# Patient Record
Sex: Female | Born: 1950 | Race: White | Hispanic: No | Smoking: Never smoker
Health system: Southern US, Community
[De-identification: ages and names within clinical notes are randomized; demographics above are authoritative.]

## PROBLEM LIST (undated history)

## (undated) DIAGNOSIS — A048 Other specified bacterial intestinal infections: Secondary | ICD-10-CM

## (undated) DIAGNOSIS — Z9289 Personal history of other medical treatment: Secondary | ICD-10-CM

## (undated) DIAGNOSIS — E871 Hypo-osmolality and hyponatremia: Secondary | ICD-10-CM

## (undated) DIAGNOSIS — K5900 Constipation, unspecified: Secondary | ICD-10-CM

## (undated) DIAGNOSIS — E7800 Pure hypercholesterolemia, unspecified: Secondary | ICD-10-CM

## (undated) DIAGNOSIS — E559 Vitamin D deficiency, unspecified: Secondary | ICD-10-CM

## (undated) DIAGNOSIS — F32A Depression, unspecified: Secondary | ICD-10-CM

## (undated) DIAGNOSIS — F329 Major depressive disorder, single episode, unspecified: Secondary | ICD-10-CM

## (undated) DIAGNOSIS — Z860100 Personal history of colon polyps, unspecified: Secondary | ICD-10-CM

## (undated) DIAGNOSIS — K5790 Diverticulosis of intestine, part unspecified, without perforation or abscess without bleeding: Secondary | ICD-10-CM

## (undated) DIAGNOSIS — G43909 Migraine, unspecified, not intractable, without status migrainosus: Secondary | ICD-10-CM

## (undated) DIAGNOSIS — R87619 Unspecified abnormal cytological findings in specimens from cervix uteri: Secondary | ICD-10-CM

## (undated) DIAGNOSIS — M1990 Unspecified osteoarthritis, unspecified site: Secondary | ICD-10-CM

## (undated) HISTORY — DX: Personal history of other medical treatment: Z92.89

## (undated) HISTORY — PX: RECTAL PROLAPSE REPAIR: SHX759

## (undated) HISTORY — DX: Unspecified abnormal cytological findings in specimens from cervix uteri: R87.619

## (undated) HISTORY — PX: UMBILICAL HERNIA REPAIR: SHX196

## (undated) HISTORY — PX: COSMETIC SURGERY: SHX468

## (undated) HISTORY — PX: ABDOMINAL HYSTERECTOMY: SHX81

---
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
## 1982-08-07 DIAGNOSIS — Z9289 Personal history of other medical treatment: Secondary | ICD-10-CM

## 1982-08-07 HISTORY — DX: Personal history of other medical treatment: Z92.89

## 1982-08-07 HISTORY — PX: HEMORRHOID SURGERY: SHX153

## 1998-03-11 ENCOUNTER — Other Ambulatory Visit: Admission: RE | Admit: 1998-03-11 | Discharge: 1998-03-11 | Payer: Self-pay | Admitting: Obstetrics & Gynecology

## 2000-01-23 ENCOUNTER — Encounter: Payer: Self-pay | Admitting: Emergency Medicine

## 2000-01-23 ENCOUNTER — Emergency Department (HOSPITAL_COMMUNITY): Admission: EM | Admit: 2000-01-23 | Discharge: 2000-01-23 | Payer: Self-pay | Admitting: Emergency Medicine

## 2000-02-24 ENCOUNTER — Encounter: Payer: Self-pay | Admitting: Obstetrics & Gynecology

## 2000-02-24 ENCOUNTER — Encounter: Admission: RE | Admit: 2000-02-24 | Discharge: 2000-02-24 | Payer: Self-pay | Admitting: Obstetrics & Gynecology

## 2000-10-02 ENCOUNTER — Other Ambulatory Visit: Admission: RE | Admit: 2000-10-02 | Discharge: 2000-10-02 | Payer: Self-pay | Admitting: Obstetrics & Gynecology

## 2001-06-20 ENCOUNTER — Encounter: Admission: RE | Admit: 2001-06-20 | Discharge: 2001-06-20 | Payer: Self-pay | Admitting: Obstetrics and Gynecology

## 2001-06-20 ENCOUNTER — Encounter: Payer: Self-pay | Admitting: Obstetrics and Gynecology

## 2002-05-19 ENCOUNTER — Encounter: Admission: RE | Admit: 2002-05-19 | Discharge: 2002-05-19 | Payer: Self-pay | Admitting: Rheumatology

## 2002-05-19 ENCOUNTER — Encounter: Payer: Self-pay | Admitting: Rheumatology

## 2002-06-03 ENCOUNTER — Encounter: Payer: Self-pay | Admitting: Rheumatology

## 2002-06-03 ENCOUNTER — Encounter: Admission: RE | Admit: 2002-06-03 | Discharge: 2002-06-03 | Payer: Self-pay | Admitting: Rheumatology

## 2003-02-04 ENCOUNTER — Other Ambulatory Visit: Admission: RE | Admit: 2003-02-04 | Discharge: 2003-02-04 | Payer: Self-pay | Admitting: Family Medicine

## 2003-06-25 ENCOUNTER — Encounter: Admission: RE | Admit: 2003-06-25 | Discharge: 2003-06-25 | Payer: Self-pay | Admitting: Family Medicine

## 2004-02-12 ENCOUNTER — Other Ambulatory Visit: Admission: RE | Admit: 2004-02-12 | Discharge: 2004-02-12 | Payer: Self-pay | Admitting: Family Medicine

## 2004-11-04 ENCOUNTER — Encounter: Admission: RE | Admit: 2004-11-04 | Discharge: 2004-11-04 | Payer: Self-pay | Admitting: Obstetrics & Gynecology

## 2005-08-11 ENCOUNTER — Other Ambulatory Visit: Admission: RE | Admit: 2005-08-11 | Discharge: 2005-08-11 | Payer: Self-pay | Admitting: Family Medicine

## 2005-11-08 ENCOUNTER — Encounter: Admission: RE | Admit: 2005-11-08 | Discharge: 2005-11-08 | Payer: Self-pay | Admitting: Family Medicine

## 2005-11-23 ENCOUNTER — Encounter: Admission: RE | Admit: 2005-11-23 | Discharge: 2005-11-23 | Payer: Self-pay | Admitting: Family Medicine

## 2006-05-15 ENCOUNTER — Ambulatory Visit (HOSPITAL_COMMUNITY): Admission: RE | Admit: 2006-05-15 | Discharge: 2006-05-15 | Payer: Self-pay | Admitting: Family Medicine

## 2006-06-26 ENCOUNTER — Ambulatory Visit: Payer: Self-pay | Admitting: Cardiology

## 2006-08-27 ENCOUNTER — Ambulatory Visit (HOSPITAL_COMMUNITY): Admission: RE | Admit: 2006-08-27 | Discharge: 2006-08-27 | Payer: Self-pay | Admitting: Family Medicine

## 2006-09-25 ENCOUNTER — Ambulatory Visit: Payer: Self-pay | Admitting: Cardiology

## 2006-09-25 LAB — CONVERTED CEMR LAB
ALT: 16 units/L (ref 0–40)
AST: 21 units/L (ref 0–37)
Albumin: 3.9 g/dL (ref 3.5–5.2)
Alkaline Phosphatase: 49 units/L (ref 39–117)
LDL Cholesterol: 104 mg/dL — ABNORMAL HIGH (ref 0–99)
Total CHOL/HDL Ratio: 3.3
Triglycerides: 67 mg/dL (ref 0–149)
VLDL: 13 mg/dL (ref 0–40)

## 2006-09-27 ENCOUNTER — Ambulatory Visit: Payer: Self-pay | Admitting: Cardiology

## 2006-12-18 ENCOUNTER — Encounter: Admission: RE | Admit: 2006-12-18 | Discharge: 2006-12-18 | Payer: Self-pay | Admitting: Family Medicine

## 2006-12-24 ENCOUNTER — Encounter: Admission: RE | Admit: 2006-12-24 | Discharge: 2006-12-24 | Payer: Self-pay | Admitting: Family Medicine

## 2007-01-10 ENCOUNTER — Encounter: Admission: RE | Admit: 2007-01-10 | Discharge: 2007-01-10 | Payer: Self-pay | Admitting: Family Medicine

## 2007-02-15 ENCOUNTER — Ambulatory Visit (HOSPITAL_COMMUNITY): Admission: RE | Admit: 2007-02-15 | Discharge: 2007-02-15 | Payer: Self-pay | Admitting: Family Medicine

## 2007-06-13 ENCOUNTER — Encounter: Admission: RE | Admit: 2007-06-13 | Discharge: 2007-06-13 | Payer: Self-pay | Admitting: Obstetrics & Gynecology

## 2007-08-28 ENCOUNTER — Ambulatory Visit (HOSPITAL_COMMUNITY): Admission: RE | Admit: 2007-08-28 | Discharge: 2007-08-28 | Payer: Self-pay | Admitting: Obstetrics & Gynecology

## 2007-12-23 ENCOUNTER — Encounter: Admission: RE | Admit: 2007-12-23 | Discharge: 2007-12-23 | Payer: Self-pay | Admitting: Obstetrics & Gynecology

## 2008-04-01 ENCOUNTER — Ambulatory Visit (HOSPITAL_COMMUNITY): Admission: RE | Admit: 2008-04-01 | Discharge: 2008-04-01 | Payer: Self-pay | Admitting: Obstetrics & Gynecology

## 2009-01-29 ENCOUNTER — Encounter: Admission: RE | Admit: 2009-01-29 | Discharge: 2009-01-29 | Payer: Self-pay | Admitting: Family Medicine

## 2009-02-05 ENCOUNTER — Encounter: Admission: RE | Admit: 2009-02-05 | Discharge: 2009-02-05 | Payer: Self-pay | Admitting: Family Medicine

## 2010-08-07 HISTORY — PX: COLONOSCOPY: SHX174

## 2010-08-27 ENCOUNTER — Encounter: Payer: Self-pay | Admitting: Family Medicine

## 2010-08-28 ENCOUNTER — Encounter: Payer: Self-pay | Admitting: Family Medicine

## 2010-11-04 ENCOUNTER — Emergency Department (HOSPITAL_COMMUNITY): Payer: BC Managed Care – PPO

## 2010-11-04 ENCOUNTER — Inpatient Hospital Stay (HOSPITAL_COMMUNITY)
Admission: EM | Admit: 2010-11-04 | Discharge: 2010-11-06 | DRG: 025 | Disposition: A | Discharge status: Home / Self Care | Payer: BC Managed Care – PPO | Attending: Internal Medicine | Admitting: Internal Medicine

## 2010-11-04 DIAGNOSIS — E785 Hyperlipidemia, unspecified: Secondary | ICD-10-CM | POA: Diagnosis present

## 2010-11-04 DIAGNOSIS — F341 Dysthymic disorder: Secondary | ICD-10-CM | POA: Diagnosis present

## 2010-11-04 DIAGNOSIS — R55 Syncope and collapse: Secondary | ICD-10-CM | POA: Diagnosis present

## 2010-11-04 DIAGNOSIS — G4700 Insomnia, unspecified: Secondary | ICD-10-CM | POA: Diagnosis present

## 2010-11-04 DIAGNOSIS — G43109 Migraine with aura, not intractable, without status migrainosus: Principal | ICD-10-CM | POA: Diagnosis present

## 2010-11-04 LAB — URINE MICROSCOPIC-ADD ON

## 2010-11-04 LAB — URINALYSIS, ROUTINE W REFLEX MICROSCOPIC
Glucose, UA: NEGATIVE mg/dL
Protein, ur: NEGATIVE mg/dL
Urobilinogen, UA: 0.2 mg/dL (ref 0.0–1.0)

## 2010-11-04 LAB — BASIC METABOLIC PANEL
BUN: 19 mg/dL (ref 6–23)
Chloride: 103 mEq/L (ref 96–112)
GFR calc Af Amer: >60 mL/min (ref >60)
GFR calc non Af Amer: >60 mL/min (ref >60)
Potassium: 3.7 mEq/L (ref 3.5–5.1)
Sodium: 136 mEq/L (ref 135–145)

## 2010-11-04 LAB — CBC
Platelets: 190 10*3/uL (ref 150–400)
RBC: 4.07 MIL/uL (ref 3.87–5.11)

## 2010-11-04 LAB — DIFFERENTIAL
Basophils Absolute: 0.1 10*3/uL (ref 0.0–0.1)
Basophils Relative: 1 % (ref 0–1)
Eosinophils Relative: 4 % (ref 0–5)
Lymphs Abs: 2.2 10*3/uL (ref 0.7–4.0)
Monocytes Absolute: 0.6 10*3/uL (ref 0.1–1.0)
Monocytes Relative: 9 % (ref 3–12)

## 2010-11-04 LAB — CK TOTAL AND CKMB (NOT AT ARMC)
Relative Index: INVALID (ref 0.0–2.5)
Total CK: 61 U/L (ref 7–177)

## 2010-11-04 LAB — TROPONIN I: Troponin I: 0.02 ng/mL (ref 0.00–0.06)

## 2010-11-04 LAB — GLUCOSE, CAPILLARY: Glucose-Capillary: 109 mg/dL — ABNORMAL HIGH (ref 70–99)

## 2010-11-05 ENCOUNTER — Inpatient Hospital Stay (HOSPITAL_COMMUNITY): Payer: BC Managed Care – PPO

## 2010-11-05 DIAGNOSIS — I059 Rheumatic mitral valve disease, unspecified: Secondary | ICD-10-CM

## 2010-11-05 LAB — BASIC METABOLIC PANEL
BUN: 13 mg/dL (ref 6–23)
Creatinine, Ser: 0.67 mg/dL (ref 0.4–1.2)
GFR calc Af Amer: >60 mL/min (ref >60)
GFR calc non Af Amer: >60 mL/min (ref >60)

## 2010-11-05 LAB — CARDIAC PANEL(CRET KIN+CKTOT+MB+TROPI)
CK, MB: 0.9 ng/mL (ref 0.3–4.0)
Relative Index: INVALID (ref 0.0–2.5)
Total CK: 46 U/L (ref 7–177)
Troponin I: 0.01 ng/mL (ref 0.00–0.06)
Troponin I: 0.02 ng/mL (ref 0.00–0.06)

## 2010-11-05 LAB — CBC
Hemoglobin: 11 g/dL — ABNORMAL LOW (ref 12.0–15.0)
MCV: 92.9 fL (ref 78.0–100.0)
Platelets: 191 10*3/uL (ref 150–400)
RBC: 3.53 MIL/uL — ABNORMAL LOW (ref 3.87–5.11)
WBC: 5.9 10*3/uL (ref 4.0–10.5)

## 2010-11-05 LAB — LIPID PANEL
HDL: 62 mg/dL (ref >39)
Total CHOL/HDL Ratio: 3 RATIO
VLDL: 8 mg/dL (ref 0–40)

## 2010-11-07 NOTE — Discharge Summary (Signed)
[name], [name]              ACCOUNT NO.:  [id number]  MEDICAL RECORD NO.:  [id number]           PATIENT TYPE:  I  LOCATION:  1405                         FACILITY:  [facility]  PHYSICIAN:  Hollice Espy, M.D.DATE OF BIRTH:  01-24-1951  DATE OF ADMISSION:  11/04/2010 DATE OF DISCHARGE:  11/06/2010                              DISCHARGE SUMMARY   PRIMARY CARE PHYSICIAN:  Duncan Dull, MD  DISCHARGE DIAGNOSES: 1. Syncope with negative workup, felt to be secondary to aura from     complicated migraine. 2. Complicated migraine. 3. History of anxiety. 4. History of hyperlipidemia. 5. History of depression.  DISCHARGE MEDICATIONS: 1. Imitrex 25 mg 1 p.o. p.r.n. for onset of headache, repeat in 2     hours once only if headache persists. 2. OxyIR 5 mg p.o. q.4 h. p.r.n. 3. Phenergan 12.5 p.o. q.8 h. p.r.n. 4. Xanax 0.25 p.o. b.i.d. p.r.n. 5. She will continue on Klonopin 2 p.o. nightly. 6. Lotemax 1 drop both eyes daily. 7. Restasis 1 drop both eyes b.i.d. 8. Trazodone 50 p.o. nightly. 9. Viibryd 20 mg p.o. daily.  HOSPITAL COURSE:  The patient is a 60 year old white female with past medical history of anxiety, depression, and hyperlipidemia who presented after a reported syncopal event while waiting in a line her fiance at a concert.  She was taken inside and revived, but then had another episode again and was brought to the emergency room.  According to the patient, she has had a headache which notes from transport to the emergency room until now with associated photophobia, some mild nausea as well.  The patient underwent a full syncope workup.  She had no events by telemetry.  Her MRI, MRA showed no evidence of any acute CVA or artery stenosis.  Cardiac markers were cycled and always were negative. Electrolytes were normal, no signs of any infection.  She was not orthostatic and on discussion with the patient, she had a similar event about a month ago where she had  a passing out spell followed by a headache.  The patient has a history of migraines, but did not have migraines for over a year and then she says they have returned back in the past as of late significantly.  We had an extensive discussion, we felt that likely the cause of her syncope is a migraine related aura and in terms of underlying treatment, we recommend treatment with anti- migraine medication, but more importantly I discussed with the patient if she certainly has migraines suddenly after an extended period of time and then there may be an underlying factor such as some sort of underlying personal stressor, I advised her to take a very close look at any kind of recent events that have perhaps set off this.  I do not think this is related to any type of hormonal change from menopause given the fact that her last period was 9 years ago.  The family, the patient, and her fiance understood and agreed to this plan.  For a full thorough workup, the patient underwent a 2-D echocardiogram which was performed on March 31st, the results of  which are pending.  Plan will be for the patient to be discharged to home.  She is not cleared to return back to work or to drive until she follows up with Dr. Shaune Pollack, her PCP, but when she does follow up with Dr. Shaune Pollack this week, her echo maybe reviewed including the rest of her followup. Her discharge diet is a regular diet.  Activity is slow to increase, again not cleared to return back to work or to drive.  Her disposition is improved.  She is being discharged to home.     Hollice Espy, M.D.     SKK/MEDQ  D:  11/06/2010  T:  11/07/2010  Job:  [id number]  cc:   Duncan Dull, M.D. Fax: [phone]  Electronically Signed by Virginia Rochester M.D. on 11/07/2010 02:37:58 PM

## 2010-11-09 NOTE — H&P (Signed)
[name], [name]              ACCOUNT NO.:  [id number]  MEDICAL RECORD NO.:  [id number]           PATIENT TYPE:  I  LOCATION:  1405                         FACILITY:  [facility]  PHYSICIAN:  Della Goo, M.D. DATE OF BIRTH:  06/06/1951  DATE OF ADMISSION:  11/04/2010 DATE OF DISCHARGE:                             HISTORY & PHYSICAL   PRIMARY CARE PHYSICIAN:  Duncan Dull, MD  CHIEF COMPLAINT:  Nearly passed out.  HISTORY OF PRESENT ILLNESS:  This is a 60 year old female who was brought to the emergency department by her husband after nearly passing out while they were standing in line for a concert this evening.  This occurred at about 8 p.m.  The patient was caught before she fell to the ground.  They went inside and it happened again.  The patient does not recall what had happened, but she reportedly did not pass out.  The patient had no prodromal symptoms prior to passing out.  Her husband reports that they had not had dinner and her last meal was at lunch. The patient denied having any chest pain or headache prior to the episode.  Following the episode, she began to have a migraine headache. The patient reports as well not sleeping well over the past 24 hours despite taking her regular medications for sleeping.  The patient denies having any previous similar episodes of syncope.  PAST MEDICAL HISTORY: 1. Dyslipidemia. 2. Depression. 3. Anxiety. 4. Migraine headaches.  PAST SURGICAL HISTORY:  None.  MEDICATIONS:  At this time include Viibryd an SSRI, Klonopin, trazodone.  ALLERGIES:  No known drug allergies; however, the patient has intolerances to statin medications and Zetia, all causing myalgias.  SOCIAL HISTORY:  The patient is married.  She is a nonsmoker, nondrinker.  No history of illicit drug usage.  FAMILY HISTORY:  Positive for coronary artery disease in her mother who died at age 22 of an MI and her maternal grandfather who died at age 65 of an  MI.  Positive hypertension in her mother and positive cancer in her father who had eye cancer.  No diabetic disease in her family that she knows of.  REVIEW OF SYSTEMS:  Pertinents mentioned above.  The patient denies having any nausea, vomiting, diarrhea, fevers, chills.  PHYSICAL EXAMINATION:  GENERAL:  This is a 60 year old well-nourished, well-developed Caucasian female who is in no acute distress. VITAL SIGNS:  Temperature 97.6, blood pressure 119/86, heart rate 71, respirations 16, O2 sats 99%. HEENT:  Normocephalic, atraumatic.  Pupils equally round, reactive to light.  Extraocular movements are intact.  Funduscopic benign.  There is no scleral icterus.  Nares are patent bilaterally.  Oropharynx is clear. NECK:  Supple.  Full range of motion.  No thyromegaly, adenopathy, jugular venous distention. CARDIOVASCULAR:  Regular rate and rhythm.  No murmurs, gallops, rubs appreciated. LUNGS:  Clear to auscultation bilaterally.  No rales, rhonchi, or wheezes. ABDOMEN:  Positive bowel sounds, soft, nontender, nondistended.  No hepatosplenomegaly. EXTREMITIES:  Without cyanosis, clubbing, or edema. NEUROLOGIC:  The patient is alert and oriented x3 at this time.  Her cranial nerves are intact.  She is  able to move all 4 of her extremities.  There are no focal deficits on examination.  Gait has not been assessed.  LABORATORY STUDIES:  White blood cell count 6.4, hemoglobin 12.5, hematocrit 37.1, MCV 91.2, platelets 190.  Sodium 136, potassium 3.7, chloride 103, carbon dioxide 26, BUN 19, creatinine 0.82, and glucose 107.  Urinalysis with trace leukocyte esterase, trace ketones.  Urine microscopic with rare epithelial, 0-2 white blood cells, 0-2 red blood cells, a few bacteria.  Cardiac markers with a total CK of 61, CK-MB 1.0, and troponin 0.02.  CT scan of the head is negative for any intracranial findings or mass lesions or signs of infarction or mass effect.  EKG reveals normal  sinus rhythm with a first-degree AV block. No acute ST-segment changes are seen, however.  ASSESSMENT:  A 60 year old female being admitted with: 1. Presyncope. 2. Dyslipidemia. 3. Migraine headache. 4. Depression. 5. Anxiety. 6. Insomnia.  PLAN:  The patient will be admitted to telemetry area for monitoring. Syncope workup will be initiated.  Neurologic checks have been ordered along with cardiac enzymes and orthostatic vital signs will also be checked.  The patient's regular medications will be reconciled and the patient will be sent for an MRI, MRA study of the brain as well as a carotid ultrasound study and DVT prophylaxis will be ordered.  The patient is a full code.  Further workup will ensue pending results of the patient's clinical course and her studies.     Della Goo, M.D.     HJ/MEDQ  D:  11/05/2010  T:  11/05/2010  Job:  [id number]  cc:   Duncan Dull, M.D. Fax: [phone]  Electronically Signed by Della Goo M.D. on 11/09/2010 06:04:37 AM

## 2010-12-23 NOTE — Assessment & Plan Note (Signed)
[facility]                            CARDIOLOGY OFFICE NOTE   [name], Peterson                     MRN:          [id number]  DATE:09/27/2006                            DOB:          08-22-1950    PRIMARY CARE PHYSICIAN:  Dr. Shaune Pollack.   HISTORY OF PRESENT ILLNESS:  Ms. [name] is a 60 year old woman without a  history of atherosclerosis.  Her risk factors include abdominal obesity  and a family history of premature atherosclerotic disease.  She self-  referred to me for assistance and management of her cholesterol.  On the  basis of Framingham predicted 10-year risk of heart disease at 8% and a  heart event rate of 2%, we started her on a Statin with a goal LDL of  less than 130.  Having been on the Pravachol 20 mg daily for 3 months,  her LDL has fallen from 149 to 104.  She continues to follow a low-  cholesterol diet and has recently joined a gym, where she is exercising  regularly.  She feels well.  She has not had any muscle discomfort.   CURRENT MEDICATIONS:  1. Lexapro 10 mg daily.  2. Detrol LA 4 mg daily.  3. Restoril 30 mg nightly.  4. Pravachol 20 mg daily.   PHYSICAL EXAMINATION:  She is generally well-appearing in no distress.  Heart rate 57, blood pressure 118/80, weight 155 pounds.  She has no jugular venous distension, thyromegaly, or lymphadenopathy.  LUNGS:  Clear to auscultation.  She has a nondisplaced point of maximal impulse.  There is a regular  rate and rhythm without murmur, rub, or gallop.  ABDOMEN:  Soft, nondistended, and nontender.  There is no  hepatosplenomegaly.  Bowel sounds are normal.  EXTREMITIES:  Warm without cyanosis, clubbing, or edema, or ulceration.   LABORATORY STUDIES:  As reviewed in the HPI.   IMPRESSION/RECOMMENDATIONS:  Hypercholesterolemia:  Nice response to her  initiation of Statin.  Will continue this.  I have recommended that she  follow up only with Dr. Kevan Peterson, who will now resume  full management of  her lipids.  She can follow up here on a p.r.n. basis.     Whitney Farber, MD  Electronically Signed    WED/MedQ  DD: 09/27/2006  DT: 09/27/2006  Job #: [id number]   cc:   [name], M.D.

## 2010-12-23 NOTE — Letter (Signed)
June 26, 2006    Duncan Dull, M.D.  [address], [address]  [place], [place] [address]   RE:  [name], [name]  MRN:  [id number]  /  DOB:  [birth year]   Dear Dr. Kevan Ny,   I saw your patient, [name], today.  She self-referred to me for  assistance in managing of her lipids.  She knows me because I take care of  her fiance.  I know that you have tried multiple medications for improvement  in her hypercholesterolemia over the years.  She has not tolerated these due  to multiple reactions.  As detailed in my attached office note, I have  suggested that we try Pravachol.  I will plan to begin this at 20 mg per  day.    Sincerely,      Salvadore Farber, MD  Electronically Signed    WED/MedQ  DD: 06/26/2006  DT: 06/27/2006  Job #: [id number]

## 2010-12-23 NOTE — Assessment & Plan Note (Signed)
[facility]                              CARDIOLOGY OFFICE NOTE   [name], [name]                     MRN:          [id number]  DATE:06/26/2006                            DOB:          [birth year]    CHIEF COMPLAINT:  The patient self-refers for lipid management.   PRIMARY CARE PHYSICIAN:  Duncan Dull, M.D.   HISTORY OF PRESENT ILLNESS:  [name] is a 60 year old woman without history  of atherosclerosis. Risk factors include abdominal obesity and premature  atherosclerotic disease in her mother who died at 23 of myocardial  infarction. [name]' boyfriend is my patient and she has self-referred for  assistance with management of her cholesterol.   [name] has had at least moderately elevated LDLs for some time. She has  tried multiple medications, which she has not been able to tolerate. Lipitor  has caused headaches and muscle aches; Crestor caused diarrhea; Zocor caused  headaches; Zetia caused a reaction she cannot recall; and Welchol caused a  reaction she cannot recall. She provides me with a lipid study from January  at which time it appears she was on Zetia 10 mg per day. At that time, LDL  was 149 and HDL 55.   PAST MEDICAL HISTORY:  1. Status post hemorrhoidectomy.  2. Cesarean section in 1984.   ALLERGIES:  None known. Multiple medication INTOLERANCES AS DISCUSSED IN  HPI.   CURRENT MEDICATIONS:  1. Lexapro 10 mg per day.  2. Detrol LA 4 mg per day.  3. Restoril 30 mg q. h.s.   SOCIAL HISTORY:  The patient is engaged to my patient Aeon Koors. She  works as an Environmental health practitioner. She enjoys walking fairly regularly.  She has two grown children. She has never smoked and denies alcohol or  illicit drug use. She attempts to follow a low-cholesterol diet, but has  found this difficult because Tim eats poorly.   FAMILY HISTORY:  Father died at 61 of prostate cancer. Mother died at 38 of  myocardial infarction.  Three siblings are alive, one with hypertension. Her  children are both alive and well.   REVIEW OF SYSTEMS:  Remarkable for wearing glasses, occasional palpitations,  received a transfusion during her C-section and is otherwise negative in  detail.   PHYSICAL EXAMINATION:  On physical examination, she is overweight.  Generally, well-appearing in no acute distress with a heart rate of 70,  blood pressure of 110/68. She is 5 feet tall and weighs 155 pounds.  SKIN: Is normal.  MUSCULOSKELETAL: Is normal.  HEENT: Is normal. She has no jugulovenous distention, thyromegaly or  lymphadenopathy.  LUNGS: Respiratory effort is normal. Lungs are clear to auscultation.  She has a nondisplaced point of maximal cardiac impulse. There is a regular  rate and rhythm without murmur, rub or gallop.  ABDOMEN: Soft, nondistended, nontender. There is no hepatosplenomegaly.  Bowel sounds are normal.  EXTREMITIES: Are warm without clubbing, cyanosis, edema or ulceration.  Carotid pulses are 2+ bilaterally without bruit.  Femoral pulses are 2+  bilaterally without bruit. DP and PT pulses are 2+  bilaterally.  She is alert and oriented x3 with normal affect.  SKIN: Is remarkable for the absence of xanthelasma.   ELECTROCARDIOGRAM: Demonstrates normal sinus rhythm at 70 beats per minutes  and is a normal EKG.   On the basis of an LDL obtained while on Zetia, I suspect that her untreated  LDL is substantially greater than 160. I do not have those records, however.  On the basis of her abdominal obesity, age, and family history, her 10-year  Framingham total coronary heart disease risk is 8%, with a heart event rate  of 2%. Per guidelines, this gives her an LDL goal of less than 130. [name]  is very eager to get to this goal. She adamant about not following in the  footsteps of her mother's premature death.   I advised [name] that the first step in this needs to be strict adherence  to a low-cholesterol  diet. I reiterated this to her fiance as well and let  him know that he has a substantial responsibility in this as well. They both  seem to hear this well. I suggested that we may try additional statins as  the side effects are often idiosyncratic. To that end, I have suggested that  we begin with 20 mg of Pravachol. I will have her follow up in 3 months with  plans for check of a lipid profile and liver function test. She will call if  she is unable to tolerate this in the interim.     Salvadore Farber, MD  Electronically Signed    WED/MedQ  DD: 06/26/2006  DT: 06/26/2006  Job #: [id number]

## 2011-03-20 ENCOUNTER — Other Ambulatory Visit: Payer: Self-pay | Admitting: Family Medicine

## 2011-03-20 DIAGNOSIS — Z1231 Encounter for screening mammogram for malignant neoplasm of breast: Secondary | ICD-10-CM

## 2011-04-05 ENCOUNTER — Ambulatory Visit
Admission: RE | Admit: 2011-04-05 | Discharge: 2011-04-05 | Disposition: A | Discharge status: Home / Self Care | Payer: BC Managed Care – PPO | Source: Ambulatory Visit | Attending: Family Medicine | Admitting: Family Medicine

## 2011-04-05 DIAGNOSIS — Z1231 Encounter for screening mammogram for malignant neoplasm of breast: Secondary | ICD-10-CM

## 2011-04-21 ENCOUNTER — Other Ambulatory Visit: Payer: Self-pay | Admitting: Family Medicine

## 2011-04-21 DIAGNOSIS — M899 Disorder of bone, unspecified: Secondary | ICD-10-CM

## 2011-05-09 ENCOUNTER — Other Ambulatory Visit: Payer: BC Managed Care – PPO

## 2011-05-11 ENCOUNTER — Ambulatory Visit
Admission: RE | Admit: 2011-05-11 | Discharge: 2011-05-11 | Disposition: A | Discharge status: Home / Self Care | Payer: BC Managed Care – PPO | Source: Ambulatory Visit | Attending: Family Medicine | Admitting: Family Medicine

## 2011-05-11 DIAGNOSIS — M949 Disorder of cartilage, unspecified: Secondary | ICD-10-CM

## 2011-05-11 DIAGNOSIS — M899 Disorder of bone, unspecified: Secondary | ICD-10-CM

## 2012-02-27 DIAGNOSIS — M3501 Sicca syndrome with keratoconjunctivitis: Secondary | ICD-10-CM | POA: Insufficient documentation

## 2012-02-27 DIAGNOSIS — H02889 Meibomian gland dysfunction of unspecified eye, unspecified eyelid: Secondary | ICD-10-CM | POA: Insufficient documentation

## 2012-05-15 ENCOUNTER — Other Ambulatory Visit: Payer: Self-pay | Admitting: Family Medicine

## 2012-05-15 DIAGNOSIS — Z1231 Encounter for screening mammogram for malignant neoplasm of breast: Secondary | ICD-10-CM

## 2012-06-10 ENCOUNTER — Ambulatory Visit
Admission: RE | Admit: 2012-06-10 | Discharge: 2012-06-10 | Disposition: A | Discharge status: Home / Self Care | Payer: BC Managed Care – PPO | Source: Ambulatory Visit | Attending: Family Medicine | Admitting: Family Medicine

## 2012-06-10 DIAGNOSIS — Z1231 Encounter for screening mammogram for malignant neoplasm of breast: Secondary | ICD-10-CM

## 2012-09-30 ENCOUNTER — Emergency Department (HOSPITAL_COMMUNITY): Payer: BC Managed Care – PPO

## 2012-09-30 ENCOUNTER — Encounter (HOSPITAL_COMMUNITY): Payer: Self-pay | Admitting: Emergency Medicine

## 2012-09-30 ENCOUNTER — Emergency Department (HOSPITAL_COMMUNITY)
Admission: EM | Admit: 2012-09-30 | Discharge: 2012-09-30 | Disposition: A | Discharge status: Home / Self Care | Payer: BC Managed Care – PPO | Attending: Emergency Medicine | Admitting: Emergency Medicine

## 2012-09-30 DIAGNOSIS — E7800 Pure hypercholesterolemia, unspecified: Secondary | ICD-10-CM | POA: Insufficient documentation

## 2012-09-30 DIAGNOSIS — Z860100 Personal history of colon polyps, unspecified: Secondary | ICD-10-CM | POA: Insufficient documentation

## 2012-09-30 DIAGNOSIS — F329 Major depressive disorder, single episode, unspecified: Secondary | ICD-10-CM | POA: Insufficient documentation

## 2012-09-30 DIAGNOSIS — F3289 Other specified depressive episodes: Secondary | ICD-10-CM | POA: Insufficient documentation

## 2012-09-30 DIAGNOSIS — Z8719 Personal history of other diseases of the digestive system: Secondary | ICD-10-CM | POA: Insufficient documentation

## 2012-09-30 DIAGNOSIS — R109 Unspecified abdominal pain: Secondary | ICD-10-CM

## 2012-09-30 DIAGNOSIS — R197 Diarrhea, unspecified: Secondary | ICD-10-CM | POA: Insufficient documentation

## 2012-09-30 DIAGNOSIS — R110 Nausea: Secondary | ICD-10-CM | POA: Insufficient documentation

## 2012-09-30 DIAGNOSIS — E559 Vitamin D deficiency, unspecified: Secondary | ICD-10-CM | POA: Insufficient documentation

## 2012-09-30 DIAGNOSIS — K922 Gastrointestinal hemorrhage, unspecified: Secondary | ICD-10-CM | POA: Insufficient documentation

## 2012-09-30 DIAGNOSIS — R1084 Generalized abdominal pain: Secondary | ICD-10-CM | POA: Insufficient documentation

## 2012-09-30 DIAGNOSIS — Z79899 Other long term (current) drug therapy: Secondary | ICD-10-CM | POA: Insufficient documentation

## 2012-09-30 HISTORY — DX: Major depressive disorder, single episode, unspecified: F32.9

## 2012-09-30 HISTORY — DX: Diverticulosis of intestine, part unspecified, without perforation or abscess without bleeding: K57.90

## 2012-09-30 HISTORY — DX: Pure hypercholesterolemia, unspecified: E78.00

## 2012-09-30 HISTORY — DX: Depression, unspecified: F32.A

## 2012-09-30 HISTORY — DX: Personal history of colon polyps, unspecified: Z86.0100

## 2012-09-30 HISTORY — DX: Constipation, unspecified: K59.00

## 2012-09-30 HISTORY — DX: Personal history of colonic polyps: Z86.010

## 2012-09-30 HISTORY — DX: Vitamin D deficiency, unspecified: E55.9

## 2012-09-30 LAB — URINALYSIS, ROUTINE W REFLEX MICROSCOPIC
Glucose, UA: NEGATIVE mg/dL
Ketones, ur: NEGATIVE mg/dL
pH: 6 (ref 5.0–8.0)

## 2012-09-30 LAB — CBC WITH DIFFERENTIAL/PLATELET
Eosinophils Absolute: 0.3 10*3/uL (ref 0.0–0.7)
Lymphocytes Relative: 25 % (ref 12–46)
Lymphs Abs: 2.1 10*3/uL (ref 0.7–4.0)
Neutro Abs: 5.5 10*3/uL (ref 1.7–7.7)
Neutrophils Relative %: 64 % (ref 43–77)
Platelets: 214 10*3/uL (ref 150–400)
RBC: 2.97 MIL/uL — ABNORMAL LOW (ref 3.87–5.11)
WBC: 8.6 10*3/uL (ref 4.0–10.5)

## 2012-09-30 LAB — LIPASE, BLOOD: Lipase: 19 U/L (ref 11–59)

## 2012-09-30 LAB — COMPREHENSIVE METABOLIC PANEL
Albumin: 3.6 g/dL (ref 3.5–5.2)
Alkaline Phosphatase: 53 U/L (ref 39–117)
BUN: 19 mg/dL (ref 6–23)
Potassium: 4.2 mEq/L (ref 3.5–5.1)
Total Protein: 6.2 g/dL (ref 6.0–8.3)

## 2012-09-30 LAB — URINE MICROSCOPIC-ADD ON

## 2012-09-30 LAB — TYPE AND SCREEN
ABO/RH(D): O NEG
Antibody Screen: NEGATIVE

## 2012-09-30 MED ORDER — IOHEXOL 300 MG/ML  SOLN
50.0000 mL | Freq: Once | INTRAMUSCULAR | Status: AC | PRN
  Administered 2012-09-30: 50 mL via ORAL

## 2012-09-30 MED ORDER — PROMETHAZINE HCL 25 MG PO TABS: 25.0000 mg | ORAL_TABLET | Freq: Four times a day (QID) | ORAL | Status: DC | PRN

## 2012-09-30 MED ORDER — HYDROMORPHONE HCL PF 1 MG/ML IJ SOLN
1.0000 mg | Freq: Once | INTRAMUSCULAR | Status: AC
  Administered 2012-09-30: 1 mg via INTRAVENOUS
  Filled 2012-09-30: qty 1

## 2012-09-30 MED ORDER — SODIUM CHLORIDE 0.9 % IV BOLUS (SEPSIS)
1000.0000 mL | Freq: Once | INTRAVENOUS | Status: AC
  Administered 2012-09-30: 1000 mL via INTRAVENOUS

## 2012-09-30 MED ORDER — IOHEXOL 300 MG/ML  SOLN
100.0000 mL | Freq: Once | INTRAMUSCULAR | Status: AC | PRN
  Administered 2012-09-30: 100 mL via INTRAVENOUS

## 2012-09-30 MED ORDER — HYDROMORPHONE HCL PF 1 MG/ML IJ SOLN
0.5000 mg | Freq: Once | INTRAMUSCULAR | Status: AC
  Administered 2012-09-30: 0.5 mg via INTRAVENOUS
  Filled 2012-09-30: qty 1

## 2012-09-30 NOTE — ED Notes (Signed)
[facility] presents with a 62 yo female transported to this facility from her physician's office with abdominal pain/diarrhea x 2 days; pt went to PCP for same and complains of diarrhea for 2 days, sick to stomach with dizziness and headache and anorexia during this period; physician was concern with pt having possible GI bleed and called for transport to this facility.

## 2012-09-30 NOTE — ED Provider Notes (Signed)
History     CSN: [id number]  Arrival date & time 09/30/12  1523   First MD Initiated Contact with Patient 09/30/12 1533      Chief Complaint  Patient presents with  . Abdominal Pain     HPI  The patient presents with concern of headache, abdominal pain, diarrhea, fatigue.  She was in her usual state of health 3 days ago.  The following day and she gradually developed crampy abdominal pain, diffuse, and diarrhea.  Over the course of that day she had innumerable episodes of loose stool, some black.  No melena, no bright red blood.  She has had no loose stool for 36 hours.  She continues to have diffuse crampy abdominal pain.  No emesis, but the patient is persistently nauseous. She gradually developed a diffuse throbbing headache. No confusion, no disorientation, no significant, the patient has profound fatigue from her diarrhea. She has been anorexic since the onset of symptoms, taking minimal fluids. No similar prior events, no history of diverticulitis. Last colonoscopy 2 years ago. The patient was seen in her physician's office, sent here for further evaluation. The patient had a rectal exam done there, which was guaiac positive.  No past medical history on file.  No past surgical history on file.  No family history on file.  History  Substance Use Topics  . Smoking status: Not on file  . Smokeless tobacco: Not on file  . Alcohol Use: Not on file    OB History   No data available      Review of Systems  Constitutional:       Per HPI, otherwise negative  HENT:       Per HPI, otherwise negative  Respiratory:       Per HPI, otherwise negative  Cardiovascular:       Per HPI, otherwise negative  Gastrointestinal: Positive for nausea and diarrhea. Negative for vomiting.  Endocrine:       Negative aside from HPI  Genitourinary:       Neg aside from HPI   Musculoskeletal:       Per HPI, otherwise negative  Skin: Negative.   Neurological: Negative for syncope.     Allergies  Review of patient's allergies indicates no known allergies.  Home Medications  No current outpatient prescriptions on file.  Pulse 78  Temp(Src) 98.2 F (36.8 C) (Oral)  Resp 21  SpO2 100%  Physical Exam  Nursing note and vitals reviewed. Constitutional: She is oriented to person, place, and time. She appears well-developed and well-nourished. No distress.  HENT:  Head: Normocephalic and atraumatic.  Eyes: Conjunctivae and EOM are normal.  Cardiovascular: Normal rate and regular rhythm.   Pulmonary/Chest: Effort normal and breath sounds normal. No stridor. No respiratory distress.  Abdominal: She exhibits no distension. There is tenderness in the suprapubic area. There is no rigidity, no rebound and no guarding.  Musculoskeletal: She exhibits no edema.  Neurological: She is alert and oriented to person, place, and time. No cranial nerve deficit.  Skin: Skin is warm and dry.  Psychiatric: She has a normal mood and affect.    ED Course  Procedures (including critical care time)  Labs Reviewed  CBC WITH DIFFERENTIAL  COMPREHENSIVE METABOLIC PANEL  URINALYSIS, ROUTINE W REFLEX MICROSCOPIC  LIPASE, BLOOD  TYPE AND SCREEN   No results found.   No diagnosis found.  Pulse ox 99% room air normal  Update: Patient remains in pain.  With Hgb drop, and concern for GI  bleed, CT ordered,  7:56 PM "I'm ready to go home."   The patient appears better, states that she feels better.  No additional episodes of diarrhea, nor any new complaints.  I discussed all results with her, and her fianc.  We discussed return precautions, the need for followup if she has any ongoing problems. MDM  This patient presents one day after her last episode of diarrhea.  Notably, the patient had innumerable episodes of diarrhea during the acute phase of her illness, and continues to have abdominal pain, dizziness and headaches.  Given the tenderness on exam, some suspicion for diverticular  disease, she had a CT scan.  This was reassuring, as were most of her labs.  There was a mild hemoglobin drop, and she was Hemoccult positive.  I discussed this with the patient, as well as the need for GI followup.  Absent ongoing complaints, and symptomatic improvement, she was discharged in stable condition to        Gerhard Munch, MD 09/30/12 [phone]

## 2013-05-21 ENCOUNTER — Other Ambulatory Visit (HOSPITAL_COMMUNITY)
Admission: RE | Admit: 2013-05-21 | Discharge: 2013-05-21 | Disposition: A | Discharge status: Home / Self Care | Payer: BC Managed Care – PPO | Source: Ambulatory Visit | Attending: Family Medicine | Admitting: Family Medicine

## 2013-05-21 ENCOUNTER — Other Ambulatory Visit: Payer: Self-pay | Admitting: Family Medicine

## 2013-05-21 DIAGNOSIS — Z124 Encounter for screening for malignant neoplasm of cervix: Secondary | ICD-10-CM | POA: Insufficient documentation

## 2013-05-26 ENCOUNTER — Other Ambulatory Visit: Payer: Self-pay

## 2013-05-26 DIAGNOSIS — Z1231 Encounter for screening mammogram for malignant neoplasm of breast: Secondary | ICD-10-CM

## 2013-06-24 ENCOUNTER — Ambulatory Visit
Admission: RE | Admit: 2013-06-24 | Discharge: 2013-06-24 | Disposition: A | Discharge status: Home / Self Care | Payer: BC Managed Care – PPO | Source: Ambulatory Visit

## 2013-06-24 DIAGNOSIS — Z1231 Encounter for screening mammogram for malignant neoplasm of breast: Secondary | ICD-10-CM

## 2013-12-17 ENCOUNTER — Other Ambulatory Visit (HOSPITAL_COMMUNITY): Payer: Self-pay | Admitting: Family Medicine

## 2013-12-17 DIAGNOSIS — R109 Unspecified abdominal pain: Secondary | ICD-10-CM

## 2013-12-18 ENCOUNTER — Ambulatory Visit (HOSPITAL_COMMUNITY)
Admission: RE | Admit: 2013-12-18 | Discharge: 2013-12-18 | Disposition: A | Discharge status: Home / Self Care | Payer: BC Managed Care – PPO | Source: Ambulatory Visit | Attending: Family Medicine | Admitting: Family Medicine

## 2013-12-18 ENCOUNTER — Encounter (HOSPITAL_COMMUNITY): Payer: Self-pay

## 2013-12-18 DIAGNOSIS — K6389 Other specified diseases of intestine: Secondary | ICD-10-CM | POA: Insufficient documentation

## 2013-12-18 DIAGNOSIS — R109 Unspecified abdominal pain: Secondary | ICD-10-CM | POA: Insufficient documentation

## 2013-12-18 LAB — POCT I-STAT CREATININE: CREATININE: 0.8 mg/dL (ref 0.50–1.10)

## 2013-12-18 MED ORDER — IOHEXOL 300 MG/ML  SOLN
80.0000 mL | Freq: Once | INTRAMUSCULAR | Status: AC | PRN
  Administered 2013-12-18: 80 mL via INTRAVENOUS

## 2014-01-01 ENCOUNTER — Ambulatory Visit
Admission: RE | Admit: 2014-01-01 | Discharge: 2014-01-01 | Disposition: A | Discharge status: Home / Self Care | Payer: BC Managed Care – PPO | Source: Ambulatory Visit | Attending: Gastroenterology | Admitting: Gastroenterology

## 2014-01-01 ENCOUNTER — Other Ambulatory Visit: Payer: Self-pay | Admitting: Gastroenterology

## 2014-01-01 DIAGNOSIS — R109 Unspecified abdominal pain: Secondary | ICD-10-CM

## 2014-01-05 ENCOUNTER — Emergency Department (HOSPITAL_COMMUNITY): Payer: BC Managed Care – PPO

## 2014-01-05 ENCOUNTER — Encounter (HOSPITAL_COMMUNITY): Payer: Self-pay | Admitting: Emergency Medicine

## 2014-01-05 ENCOUNTER — Emergency Department (HOSPITAL_COMMUNITY)
Admission: EM | Admit: 2014-01-05 | Discharge: 2014-01-05 | Disposition: A | Discharge status: Home / Self Care | Payer: BC Managed Care – PPO | Attending: Emergency Medicine | Admitting: Emergency Medicine

## 2014-01-05 DIAGNOSIS — Z860100 Personal history of colon polyps, unspecified: Secondary | ICD-10-CM | POA: Insufficient documentation

## 2014-01-05 DIAGNOSIS — F329 Major depressive disorder, single episode, unspecified: Secondary | ICD-10-CM | POA: Insufficient documentation

## 2014-01-05 DIAGNOSIS — E559 Vitamin D deficiency, unspecified: Secondary | ICD-10-CM | POA: Insufficient documentation

## 2014-01-05 DIAGNOSIS — Z79899 Other long term (current) drug therapy: Secondary | ICD-10-CM | POA: Insufficient documentation

## 2014-01-05 DIAGNOSIS — E7800 Pure hypercholesterolemia, unspecified: Secondary | ICD-10-CM | POA: Insufficient documentation

## 2014-01-05 DIAGNOSIS — F3289 Other specified depressive episodes: Secondary | ICD-10-CM | POA: Insufficient documentation

## 2014-01-05 DIAGNOSIS — K5900 Constipation, unspecified: Secondary | ICD-10-CM | POA: Insufficient documentation

## 2014-01-05 DIAGNOSIS — R1084 Generalized abdominal pain: Secondary | ICD-10-CM | POA: Insufficient documentation

## 2014-01-05 DIAGNOSIS — Z8619 Personal history of other infectious and parasitic diseases: Secondary | ICD-10-CM | POA: Insufficient documentation

## 2014-01-05 HISTORY — DX: Other specified bacterial intestinal infections: A04.8

## 2014-01-05 LAB — CBC WITH DIFFERENTIAL/PLATELET
BASOS ABS: 0 10*3/uL (ref 0.0–0.1)
BASOS PCT: 1 % (ref 0–1)
EOS ABS: 0.2 10*3/uL (ref 0.0–0.7)
EOS PCT: 4 % (ref 0–5)
HEMATOCRIT: 33.4 % — AB (ref 36.0–46.0)
HEMOGLOBIN: 11.9 g/dL — AB (ref 12.0–15.0)
Lymphocytes Relative: 37 % (ref 12–46)
Lymphs Abs: 1.9 10*3/uL (ref 0.7–4.0)
MCH: 32.4 pg (ref 26.0–34.0)
MCHC: 35.6 g/dL (ref 30.0–36.0)
MCV: 91 fL (ref 78.0–100.0)
MONO ABS: 0.5 10*3/uL (ref 0.1–1.0)
MONOS PCT: 10 % (ref 3–12)
NEUTROS ABS: 2.5 10*3/uL (ref 1.7–7.7)
Neutrophils Relative %: 48 % (ref 43–77)
Platelets: 181 10*3/uL (ref 150–400)
RBC: 3.67 MIL/uL — ABNORMAL LOW (ref 3.87–5.11)
RDW: 11.7 % (ref 11.5–15.5)
WBC: 5.1 10*3/uL (ref 4.0–10.5)

## 2014-01-05 LAB — LIPASE, BLOOD: LIPASE: 26 U/L (ref 11–59)

## 2014-01-05 LAB — HEPATIC FUNCTION PANEL
ALT: 19 U/L (ref 0–35)
AST: 20 U/L (ref 0–37)
Albumin: 3.9 g/dL (ref 3.5–5.2)
Alkaline Phosphatase: 51 U/L (ref 39–117)
TOTAL PROTEIN: 6.8 g/dL (ref 6.0–8.3)
Total Bilirubin: 0.5 mg/dL (ref 0.3–1.2)

## 2014-01-05 LAB — I-STAT CHEM 8, ED
BUN: 15 mg/dL (ref 6–23)
CALCIUM ION: 1.2 mmol/L (ref 1.13–1.30)
Chloride: 96 mEq/L (ref 96–112)
Creatinine, Ser: 1 mg/dL (ref 0.50–1.10)
GLUCOSE: 99 mg/dL (ref 70–99)
HEMATOCRIT: 37 % (ref 36.0–46.0)
HEMOGLOBIN: 12.6 g/dL (ref 12.0–15.0)
Potassium: 3.8 mEq/L (ref 3.7–5.3)
Sodium: 136 mEq/L — ABNORMAL LOW (ref 137–147)
TCO2: 23 mmol/L (ref 0–100)

## 2014-01-05 MED ORDER — DICYCLOMINE HCL 10 MG/ML IM SOLN
20.0000 mg | Freq: Once | INTRAMUSCULAR | Status: AC
  Administered 2014-01-05: 20 mg via INTRAMUSCULAR
  Filled 2014-01-05: qty 2

## 2014-01-05 MED ORDER — DICYCLOMINE HCL 20 MG PO TABS: 20.0000 mg | ORAL_TABLET | Freq: Two times a day (BID) | ORAL | Status: DC

## 2014-01-05 MED ORDER — KETOROLAC TROMETHAMINE 60 MG/2ML IM SOLN
60.0000 mg | Freq: Once | INTRAMUSCULAR | Status: AC
  Administered 2014-01-05: 60 mg via INTRAMUSCULAR
  Filled 2014-01-05: qty 2

## 2014-01-05 NOTE — ED Provider Notes (Addendum)
CSN: [id number]     Arrival date & time 01/05/14  0343 History   First MD Initiated Contact with Patient 01/05/14 0456     Chief Complaint  Patient presents with  . Abdominal Pain  . Constipation     (Consider location/radiation/quality/duration/timing/severity/associated sxs/prior Treatment) Patient is a 63 y.o. female presenting with abdominal pain and constipation. The history is provided by the patient.  Abdominal Pain Pain location:  Generalized Pain quality: aching   Pain radiates to:  Does not radiate Pain severity:  Moderate Onset quality:  Gradual Timing:  Constant Progression:  Unchanged Chronicity:  New Context: not trauma   Relieved by:  Nothing Worsened by:  Nothing tried Ineffective treatments:  None tried Associated symptoms: constipation   Risk factors: no alcohol abuse   Constipation Associated symptoms: abdominal pain     Past Medical History  Diagnosis Date  . Hypercholesteremia   . Vitamin D deficiency   . Constipation   . Depression   . Hx of colonic polyps   . Diverticulosis   . H. pylori infection    Past Surgical History  Procedure Laterality Date  . Colonoscopy  2012   History reviewed. No pertinent family history. History  Substance Use Topics  . Smoking status: Never Smoker   . Smokeless tobacco: Not on file  . Alcohol Use: Yes   OB History   Grav Para Term Preterm Abortions TAB SAB Ect Mult Living                 Review of Systems  Gastrointestinal: Positive for abdominal pain and constipation.  All other systems reviewed and are negative.     Allergies  Review of patient's allergies indicates no known allergies.  Home Medications   Prior to Admission medications   Medication Sig Start Date End Date Taking? Authorizing Provider  clonazePAM (KLONOPIN) 2 MG tablet Take 4 mg by mouth at bedtime.   Yes Historical Provider, MD  cycloSPORINE (RESTASIS) 0.05 % ophthalmic emulsion Place 1 drop into both eyes 2 (two) times  daily.   Yes Historical Provider, MD  docusate sodium (COLACE) 100 MG capsule Take 100 mg by mouth daily.   Yes Historical Provider, MD  naproxen sodium (ANAPROX) 550 MG tablet Take 550 mg by mouth 2 (two) times daily as needed (migraines).   Yes Historical Provider, MD  Omega-3 Fatty Acids (FISH OIL) 1000 MG CAPS Take 3,000 mg by mouth daily.   Yes Historical Provider, MD  polyethylene glycol (MIRALAX / GLYCOLAX) packet Take 17 g by mouth daily as needed (constipation).   Yes Historical Provider, MD  rizatriptan (MAXALT-MLT) 10 MG disintegrating tablet Take 10 mg by mouth as needed for migraine. May repeat in 2 hours if needed   Yes Historical Provider, MD  traZODone (DESYREL) 50 MG tablet Take 100 mg by mouth at bedtime.   Yes Historical Provider, MD  Vitamin D, Ergocalciferol, (DRISDOL) 50000 UNITS CAPS Take 50,000 Units by mouth every 7 (seven) days. saturday   Yes Historical Provider, MD   BP 123/63  Pulse 60  Temp(Src) 97.9 F (36.6 C) (Oral)  Resp 16  SpO2 100% Physical Exam  Constitutional: She is oriented to person, place, and time. She appears well-developed and well-nourished. No distress.  HENT:  Head: Normocephalic and atraumatic.  Mouth/Throat: Oropharynx is clear and moist.  Eyes: Conjunctivae are normal. Pupils are equal, round, and reactive to light.  Neck: Normal range of motion. Neck supple.  Cardiovascular: Normal rate, regular rhythm and intact  distal pulses.   Pulmonary/Chest: Effort normal and breath sounds normal. She has no wheezes. She has no rales.  Abdominal: Soft. Bowel sounds are normal. There is no tenderness. There is no rebound and no guarding.  Musculoskeletal: Normal range of motion.  Neurological: She is alert and oriented to person, place, and time.  Skin: Skin is warm and dry.  Psychiatric: She has a normal mood and affect.    ED Course  Procedures (including critical care time) Labs Review Labs Reviewed  CBC WITH DIFFERENTIAL - Abnormal;  Notable for the following:    RBC 3.67 (*)    Hemoglobin 11.9 (*)    HCT 33.4 (*)    All other components within normal limits  I-STAT CHEM 8, ED - Abnormal; Notable for the following:    Sodium 136 (*)    All other components within normal limits  HEPATIC FUNCTION PANEL  LIPASE, BLOOD    Imaging Review Dg Abd Acute W/chest  01/05/2014   CLINICAL DATA:  The constipation.  EXAM: ACUTE ABDOMEN SERIES (ABDOMEN 2 VIEW & CHEST 1 VIEW)  COMPARISON:  01/01/2014  FINDINGS: There is no evidence of dilated bowel loops or free intraperitoneal air. Stool volume is within normal limits; this does not contradict the history of constipation. No radiopaque calculi or other significant radiographic abnormality is seen. Heart size and mediastinal contours are within normal limits. Both lungs are clear. 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  IMPRESSION: 1. No bowel obstruction or perforation. 2. Clear chest.   Electronically Signed   By: Tiburcio PeaJonathan  Watts M.D.   On: 01/05/2014 04:46     EKG Interpretation None      MDM   Final diagnoses:  None    Case d/w Dr. Ewing SchleinMagod of GI call to be seen this week.  Will start Bentyl BID, increase miralax to TID and probiotics to QID.  Follow up with Dr. Evette CristalGanem this week    Madylyn Insco K Mell Mellott-Rasch, MD 01/05/14 0540  Jasmine AweApril K Torian Thoennes-Rasch, MD 01/05/14 [id number]

## 2014-01-05 NOTE — ED Notes (Signed)
Pt states that she has had bowel problems for awhile now, pt has had 2 hemorrhoidectomies in the past and has stayed on metamucil. Pt states that she was seen by Deboraha Sprang had a stool sample sent for analysis and it came back H-pylori positive. Pt states that she has been trying different medications but 10 days ago she she stopped having loose stools and hasn't had a stool. Pt tried 2 enemas yesterday, as well as several laxative and stool softeners.

## 2014-01-05 NOTE — Discharge Instructions (Signed)
Constipation, Adult °Constipation is when a person: °· Poops (bowel movement) less than 3 times a week. °· Has a hard time pooping. °· Has poop that is dry, hard, or bigger than normal. °HOME CARE  °· Eat more fiber, such as fruits, vegetables, whole grains like brown rice, and beans. °· Eat less fatty foods and sugar. This includes French fries, hamburgers, cookies, candy, and soda. °· If you are not getting enough fiber from food, take products with added fiber in them (supplements). °· Drink enough fluid to keep your pee (urine) clear or pale yellow. °· Go to the restroom when you feel like you need to poop. Do not hold it. °· Only take medicine as told by your doctor. Do not take medicines that help you poop (laxatives) without talking to your doctor first. °· Exercise on a regular basis, or as told by your doctor. °GET HELP RIGHT AWAY IF:  °· You have bright red blood in your poop (stool). °· Your constipation lasts more than 4 days or gets worse. °· You have belly (abdomen) or butt (rectal) pain. °· You have thin poop (as thin as a pencil). °· You lose weight, and it cannot be explained. °MAKE SURE YOU:  °· Understand these instructions. °· Will watch your condition. °· Will get help right away if you are not doing well or get worse. °Document Released: 01/10/2008 Document Revised: 10/16/2011 Document Reviewed: 05/05/2013 °ExitCare® Patient Information ©2014 [employer]. ° °Fiber Content in Foods °Drinking plenty of fluids and consuming foods high in fiber can help with constipation. See the list below for the fiber content of some common foods. °Starches and Grains / Dietary Fiber (g) °· Cheerios, 1 cup / 3 g °· Kellogg's Corn Flakes, 1 cup / 0.7 g °· Rice Krispies, 1 ¼ cup / 0.3 g °· Quaker Oat Life Cereal, ¾ cup / 2.1 g °· Oatmeal, instant (cooked), ½ cup / 2 g °· Kell[employer]'s Frosted Mini Wheats, 1 cup / 5.1 g °· Rice, brown, long-grain (cooked), 1 cup / 3.5 g °· Rice, white, long-grain (cooked), 1 cup /  0.6 g °· Macaroni, cooked, enriched, 1 cup / 2.5 g °Legumes / Dietary Fiber (g) °· Beans, baked, canned, plain or vegetarian, ½ cup / 5.2 g °· Beans, kidney, canned, ½ cup / 6.8 g °· Beans, pinto, dried (cooked), ½ cup / 7.7 g °· Beans, pinto, canned, ½ cup / 5.5 g °Breads and Crackers / Dietary Fiber (g) °· Graham crackers, plain or honey, 2 squares / 0.7 g °· Saltine crackers, 3 squares / 0.3 g °· Pretzels, plain, salted, 10 pieces / 1.8 g °· Bread, whole-wheat, 1 slice / 1.9 g °· Bread, white, 1 slice / 0.7 g °· Bread, raisin, 1 slice / 1.2 g °· Bagel, plain, 3 oz / 2 g °· Tortilla, flour, 1 oz / 0.9 g °· Tortilla, corn, 1 small / 1.5 g °· Bun, hamburger or hotdog, 1 small / 0.9 g °Fruits / Dietary Fiber (g) °· Apple, raw with skin, 1 medium / 4.4 g °· Applesauce, sweetened, ½ cup / 1.5 g °· Banana, ½ medium / 1.5 g °· Grapes, 10 grapes / 0.4 g °· Orange, 1 small / 2.3 g °· Raisin, 1.5 oz / 1.6 g °· Melon, 1 cup / 1.4 g °Vegetables / Dietary Fiber (g) °· Green beans, canned, ½ cup / 1.3 g °· Carrots (cooked), ½ cup / 2.3 g °· Broccoli (cooked), ½ cup / 2.8 g °· Peas,   frozen (cooked), ½ cup / 4.4 g °· Potatoes, mashed, ½ cup / 1.6 g °· Lettuce, 1 cup / 0.5 g °· Corn, canned, ½ cup / 1.6 g °· Tomato, ½ cup / 1.1 g °Document Released: 12/10/2006 Document Revised: 10/16/2011 Document Reviewed: 02/04/2007 °ExitCare® Patient Information ©2014 [employer]. ° °

## 2014-01-13 ENCOUNTER — Ambulatory Visit
Admission: RE | Admit: 2014-01-13 | Discharge: 2014-01-13 | Disposition: A | Discharge status: Home / Self Care | Payer: BC Managed Care – PPO | Source: Ambulatory Visit | Attending: Gastroenterology | Admitting: Gastroenterology

## 2014-01-13 ENCOUNTER — Other Ambulatory Visit: Payer: Self-pay | Admitting: Gastroenterology

## 2014-01-13 DIAGNOSIS — R197 Diarrhea, unspecified: Secondary | ICD-10-CM

## 2014-01-30 ENCOUNTER — Other Ambulatory Visit (HOSPITAL_COMMUNITY): Payer: Self-pay | Admitting: Gastroenterology

## 2014-01-30 DIAGNOSIS — K5900 Constipation, unspecified: Secondary | ICD-10-CM

## 2014-02-13 ENCOUNTER — Ambulatory Visit (HOSPITAL_COMMUNITY)
Admission: RE | Admit: 2014-02-13 | Discharge: 2014-02-13 | Disposition: A | Discharge status: Home / Self Care | Payer: BC Managed Care – PPO | Source: Ambulatory Visit | Attending: Gastroenterology | Admitting: Gastroenterology

## 2014-02-13 DIAGNOSIS — K5900 Constipation, unspecified: Secondary | ICD-10-CM | POA: Insufficient documentation

## 2014-02-13 MED ORDER — TECHNETIUM TC 99M SULFUR COLLOID
2.0000 | Freq: Once | INTRAVENOUS | Status: AC | PRN
  Administered 2014-02-13: 2 via ORAL

## 2014-06-04 ENCOUNTER — Other Ambulatory Visit: Payer: Self-pay

## 2014-06-04 ENCOUNTER — Other Ambulatory Visit: Payer: Self-pay | Admitting: Family Medicine

## 2014-06-04 DIAGNOSIS — Z1231 Encounter for screening mammogram for malignant neoplasm of breast: Secondary | ICD-10-CM

## 2014-06-04 DIAGNOSIS — M8580 Other specified disorders of bone density and structure, unspecified site: Secondary | ICD-10-CM

## 2014-06-26 ENCOUNTER — Ambulatory Visit
Admission: RE | Admit: 2014-06-26 | Discharge: 2014-06-26 | Disposition: A | Discharge status: Home / Self Care | Payer: BC Managed Care – PPO | Source: Ambulatory Visit

## 2014-06-26 ENCOUNTER — Ambulatory Visit
Admission: RE | Admit: 2014-06-26 | Discharge: 2014-06-26 | Disposition: A | Discharge status: Home / Self Care | Payer: BC Managed Care – PPO | Source: Ambulatory Visit | Attending: Family Medicine | Admitting: Family Medicine

## 2014-06-26 DIAGNOSIS — Z1231 Encounter for screening mammogram for malignant neoplasm of breast: Secondary | ICD-10-CM

## 2014-06-26 DIAGNOSIS — M8580 Other specified disorders of bone density and structure, unspecified site: Secondary | ICD-10-CM

## 2015-01-23 ENCOUNTER — Emergency Department (HOSPITAL_COMMUNITY)
Admission: EM | Admit: 2015-01-23 | Discharge: 2015-01-23 | Disposition: A | Discharge status: Home / Self Care | Payer: BC Managed Care – PPO | Attending: Emergency Medicine | Admitting: Emergency Medicine

## 2015-01-23 ENCOUNTER — Encounter (HOSPITAL_COMMUNITY): Payer: Self-pay | Admitting: *Deleted

## 2015-01-23 DIAGNOSIS — Z86010 Personal history of colonic polyps: Secondary | ICD-10-CM | POA: Diagnosis not present

## 2015-01-23 DIAGNOSIS — K5900 Constipation, unspecified: Secondary | ICD-10-CM | POA: Diagnosis not present

## 2015-01-23 DIAGNOSIS — E559 Vitamin D deficiency, unspecified: Secondary | ICD-10-CM | POA: Insufficient documentation

## 2015-01-23 DIAGNOSIS — Z79899 Other long term (current) drug therapy: Secondary | ICD-10-CM | POA: Diagnosis not present

## 2015-01-23 DIAGNOSIS — Z8711 Personal history of peptic ulcer disease: Secondary | ICD-10-CM | POA: Diagnosis not present

## 2015-01-23 DIAGNOSIS — Z8619 Personal history of other infectious and parasitic diseases: Secondary | ICD-10-CM | POA: Insufficient documentation

## 2015-01-23 DIAGNOSIS — R42 Dizziness and giddiness: Secondary | ICD-10-CM | POA: Insufficient documentation

## 2015-01-23 DIAGNOSIS — K625 Hemorrhage of anus and rectum: Secondary | ICD-10-CM | POA: Diagnosis not present

## 2015-01-23 LAB — COMPREHENSIVE METABOLIC PANEL
ALBUMIN: 3.9 g/dL (ref 3.5–5.0)
ALK PHOS: 39 U/L (ref 38–126)
ALT: 11 U/L — AB (ref 14–54)
AST: 18 U/L (ref 15–41)
Anion gap: 7 (ref 5–15)
BUN: 6 mg/dL (ref 6–20)
CHLORIDE: 102 mmol/L (ref 101–111)
CO2: 26 mmol/L (ref 22–32)
Calcium: 9.7 mg/dL (ref 8.9–10.3)
Creatinine, Ser: 0.66 mg/dL (ref 0.44–1.00)
GFR calc Af Amer: >60 mL/min (ref >60)
GFR calc non Af Amer: >60 mL/min (ref >60)
Glucose, Bld: 98 mg/dL (ref 65–99)
Potassium: 4.4 mmol/L (ref 3.5–5.1)
Sodium: 135 mmol/L (ref 135–145)
TOTAL PROTEIN: 6.5 g/dL (ref 6.5–8.1)
Total Bilirubin: 0.8 mg/dL (ref 0.3–1.2)

## 2015-01-23 LAB — CBC
HEMATOCRIT: 35.9 % — AB (ref 36.0–46.0)
Hemoglobin: 12.6 g/dL (ref 12.0–15.0)
MCH: 32.9 pg (ref 26.0–34.0)
MCHC: 35.1 g/dL (ref 30.0–36.0)
MCV: 93.7 fL (ref 78.0–100.0)
Platelets: 241 10*3/uL (ref 150–400)
RBC: 3.83 MIL/uL — ABNORMAL LOW (ref 3.87–5.11)
RDW: 12 % (ref 11.5–15.5)
WBC: 4.6 10*3/uL (ref 4.0–10.5)

## 2015-01-23 LAB — POC OCCULT BLOOD, ED: Fecal Occult Bld: NEGATIVE

## 2015-01-23 LAB — TYPE AND SCREEN
ABO/RH(D): O NEG
Antibody Screen: NEGATIVE

## 2015-01-23 MED ORDER — SODIUM CHLORIDE 0.9 % IV BOLUS (SEPSIS): 500.0000 mL | Freq: Once | INTRAVENOUS | Status: DC

## 2015-01-23 NOTE — ED Notes (Signed)
Pt reports having several episodes of dark stools. Pt has hx of gastric ulcers. Pt states that she has been under a lot of stress due to the loss of her husband.

## 2015-01-23 NOTE — ED Provider Notes (Signed)
CSN: [id number]     Arrival date & time 01/23/15  1124 History   First MD Initiated Contact with Patient 01/23/15 1347     Chief Complaint  Patient presents with  . Rectal Bleeding     (Consider location/radiation/quality/duration/timing/severity/associated sxs/prior Treatment) HPI Comments: Patient presents with black stools. She has a history of peptic ulcer disease with a bleeding ulcer in the past about 2 years ago. She states yesterday she had a black tarry stool 1. Today she had a more normal stool but still is mixed with some black. She denies any grossly bloody stools. She felt lightheaded yesterday but only was mildly lightheaded today. She denies any chest pain or shortness of breath. She had a similar episode 2 years ago and was started on acid reducing medicines by Dr. Madilyn Fireman, her gastroenterologist. She did not require blood transfusion.  Patient is a 64 y.o. female presenting with hematochezia.  Rectal Bleeding Associated symptoms: light-headedness   Associated symptoms: no abdominal pain, no dizziness, no fever and no vomiting     Past Medical History  Diagnosis Date  . Hypercholesteremia   . Vitamin D deficiency   . Constipation   . Depression   . Hx of colonic polyps   . Diverticulosis   . H. pylori infection    Past Surgical History  Procedure Laterality Date  . Colonoscopy  2012   No family history on file. History  Substance Use Topics  . Smoking status: Never Smoker   . Smokeless tobacco: Not on file  . Alcohol Use: Yes   OB History    No data available     Review of Systems  Constitutional: Negative for fever, chills, diaphoresis and fatigue.  HENT: Negative for congestion, rhinorrhea and sneezing.   Eyes: Negative.   Respiratory: Negative for cough, chest tightness and shortness of breath.   Cardiovascular: Negative for chest pain and leg swelling.  Gastrointestinal: Positive for hematochezia. Negative for nausea, vomiting, abdominal pain,  diarrhea and blood in stool.       Black stools  Genitourinary: Negative for frequency, hematuria, flank pain and difficulty urinating.  Musculoskeletal: Negative for back pain and arthralgias.  Skin: Negative for rash.  Neurological: Positive for light-headedness. Negative for dizziness, speech difficulty, weakness, numbness and headaches.      Allergies  Salicylates  Home Medications   Prior to Admission medications   Medication Sig Start Date End Date Taking? Authorizing Provider  acetaminophen (TYLENOL) 500 MG tablet Take 500 mg by mouth every 6 (six) hours as needed for mild pain.   Yes Historical Provider, MD  Calcium Carb-Cholecalciferol (CALCIUM 600 + D PO) Take 1 tablet by mouth daily.   Yes Historical Provider, MD  clonazePAM (KLONOPIN) 2 MG tablet Take 2 mg by mouth at bedtime.    Yes Historical Provider, MD  cycloSPORINE (RESTASIS) 0.05 % ophthalmic emulsion Place 1 drop into both eyes 2 (two) times daily.   Yes Historical Provider, MD  eletriptan (RELPAX) 20 MG tablet Take 20 mg by mouth 2 (two) times daily as needed for migraine or headache. May repeat in 2 hours if headache persists or recurs.   Yes Historical Provider, MD  Linaclotide (LINZESS) 290 MCG CAPS capsule Take 290 mcg by mouth every morning.   Yes Historical Provider, MD  Omega-3 Fatty Acids (FISH OIL) 1000 MG CAPS Take 1,000 mg by mouth daily.    Yes Historical Provider, MD  polyethylene glycol (MIRALAX / GLYCOLAX) packet Take 17 g by mouth daily as  needed (constipation).   Yes Historical Provider, MD  Vitamin D, Ergocalciferol, (DRISDOL) 50000 UNITS CAPS Take 50,000 Units by mouth every 7 (seven) days. saturday   Yes Historical Provider, MD   BP 130/67 mmHg  Pulse 67  Temp(Src) 97.9 F (36.6 C) (Oral)  Resp 16  Ht  (1.499 m)  Wt 112 lb 11.2 oz (51.12 kg)  BMI 22.75 kg/m2  SpO2 100% Physical Exam  Constitutional: She is oriented to person, place, and time. She appears well-developed and  well-nourished.  HENT:  Head: Normocephalic and atraumatic.  Eyes: Pupils are equal, round, and reactive to light.  Neck: Normal range of motion. Neck supple.  Cardiovascular: Normal rate, regular rhythm and normal heart sounds.   Pulmonary/Chest: Effort normal and breath sounds normal. No respiratory distress. She has no wheezes. She has no rales. She exhibits no tenderness.  Abdominal: Soft. Bowel sounds are normal. There is no tenderness. There is no rebound and no guarding.  Genitourinary:  No gross blood or melena  Musculoskeletal: Normal range of motion. She exhibits no edema.  Lymphadenopathy:    She has no cervical adenopathy.  Neurological: She is alert and oriented to person, place, and time.  Skin: Skin is warm and dry. No rash noted.  Psychiatric: She has a normal mood and affect.    ED Course  Procedures (including critical care time) Labs Review Labs Reviewed  CBC - Abnormal; Notable for the following:    RBC 3.83 (*)    HCT 35.9 (*)    All other components within normal limits  COMPREHENSIVE METABOLIC PANEL - Abnormal; Notable for the following:    ALT 11 (*)    All other components within normal limits  POC OCCULT BLOOD, ED  TYPE AND SCREEN    Imaging Review No results found.   EKG Interpretation None      MDM   Final diagnoses:  Rectal bleeding    Patient presents with reported melena. She's Hemoccult negative today. Her hemoglobin is improved from what it has been in the past, reviewing her records. She has no abdominal pain. She was discharged home in good condition. She was encouraged to have close follow-up with her gastroenterologist and return here if her symptoms worsen.    Rolan Bucco, MD 01/23/15 [phone]

## 2015-01-23 NOTE — ED Notes (Signed)
Pt refused IV and fluids.  Dr.Belfi aware.

## 2015-01-23 NOTE — Discharge Instructions (Signed)
Bloody Stools  Bloody stools often mean that there is a problem in the digestive tract. Your caregiver may use the term "melena" to describe black, tarry, and bad smelling stools or "hematochezia" to describe red or maroon-colored stools. Blood seen in the stool can be caused by bleeding anywhere along the intestinal tract.   A black stool usually means that blood is coming from the upper part of the gastrointestinal tract (esophagus, stomach, or small bowel). Passing maroon-colored stools or bright red blood usually means that blood is coming from lower down in the large bowel or the rectum. However, sometimes massive bleeding in the stomach or small intestine can cause bright red bloody stools.   Consuming black licorice, lead, iron pills, medicines containing bismuth subsalicylate, or blueberries can also cause black stools. Your caregiver can test black stools to see if blood is present.  It is important that the cause of the bleeding be found. Treatment can then be started, and the problem can be corrected. Rectal bleeding may not be serious, but you should not assume everything is okay until you know the cause. It is very important to follow up with your caregiver or a specialist in gastrointestinal problems.  CAUSES   Blood in the stools can come from various underlying causes. Often, the cause is not found during your first visit. Testing is often needed to discover the cause of bleeding in the gastrointestinal tract. Causes range from simple to serious or even life-threatening. Possible causes include:  · Hemorrhoids. These are veins that are full of blood (engorged) in the rectum. They cause pain, inflammation, and may bleed.  · Anal fissures. These are areas of painful tearing which may bleed. They are often caused by passing hard stool.  · Diverticulosis. These are pouches that form on the colon over time, with age, and may bleed significantly.  · Diverticulitis. This is inflammation in areas with  diverticulosis. It can cause pain, fever, and bloody stools, although bleeding is rare.  · Proctitis and colitis. These are inflamed areas of the rectum or colon. They may cause pain, fever, and bloody stools.  · Polyps and cancer. Colon cancer is a leading cause of preventable cancer death. It often starts out as precancerous polyps that can be removed during a colonoscopy, preventing progression into cancer. Sometimes, polyps and cancer may cause rectal bleeding.  · Gastritis and ulcers. Bleeding from the upper gastrointestinal tract (near the stomach) may travel through the intestines and produce black, sometimes tarry, often bad smelling stools. In certain cases, if the bleeding is fast enough, the stools may not be black, but red and the condition may be life-threatening.  SYMPTOMS   You may have stools that are bright red and bloody, that are normal color with blood on them, or that are dark black and tarry. In some cases, you may only have blood in the toilet bowl. Any of these cases need medical care. You may also have:  · Pain at the anus or anywhere in the rectum.  · Lightheadedness or feeling faint.  · Extreme weakness.  · Nausea or vomiting.  · Fever.  DIAGNOSIS  Your caregiver may use the following methods to find the cause of your bleeding:  · Taking a medical history. Age is important. Older people tend to develop polyps and cancer more often. If there is anal pain and a hard, large stool associated with bleeding, a tear of the anus may be the cause. If blood drips into the toilet after a bowel movement, bleeding hemorrhoids may be the   problem. The color and frequency of the bleeding are additional considerations. In most cases, the medical history provides clues, but seldom the final answer.  · A visual and finger (digital) exam. Your caregiver will inspect the anal area, looking for tears and hemorrhoids. A finger exam can provide information when there is tenderness or a growth inside. In men, the  prostate is also examined.  · Endoscopy. Several types of small, long scopes (endoscopes) are used to view the colon.  ¨ In the office, your caregiver may use a rigid, or more commonly, a flexible viewing sigmoidoscope. This exam is called flexible sigmoidoscopy. It is performed in 5 to 10 minutes.  ¨ A more thorough exam is accomplished with a colonoscope. It allows your caregiver to view the entire 5 to 6 foot long colon. Medicine to help you relax (sedative) is usually given for this exam. Frequently, a bleeding lesion may be present beyond the reach of the sigmoidoscope. So, a colonoscopy may be the best exam to start with. Both exams are usually done on an outpatient basis. This means the patient does not stay overnight in the hospital or surgery center.  ¨ An upper endoscopy may be needed to examine your stomach. Sedation is used and a flexible endoscope is put in your mouth, down to your stomach.  · A barium enema X-ray. This is an X-ray exam. It uses liquid barium inserted by enema into the rectum. This test alone may not identify an actual bleeding point. X-rays highlight abnormal shadows, such as those made by lumps (tumors), diverticuli, or colitis.  TREATMENT   Treatment depends on the cause of your bleeding.   · For bleeding from the stomach or colon, the caregiver doing your endoscopy or colonoscopy may be able to stop the bleeding as part of the procedure.  · Inflammation or infection of the colon can be treated with medicines.  · Many rectal problems can be treated with creams, suppositories, or warm baths.  · Surgery is sometimes needed.  · Blood transfusions are sometimes needed if you have lost a lot of blood.  · For any bleeding problem, let your caregiver know if you take aspirin or other blood thinners regularly.  HOME CARE INSTRUCTIONS   · Take any medicines exactly as prescribed.  · Keep your stools soft by eating a diet high in fiber. Prunes (1 to 3 a day) work well for many people.  · Drink  enough water and fluids to keep your urine clear or pale yellow.  · Take sitz baths if advised. A sitz bath is when you sit in a bathtub with warm water for 10 to 15 minutes to soak, soothe, and cleanse the rectal area.  · If enemas or suppositories are advised, be sure you know how to use them. Tell your caregiver if you have problems with this.  · Monitor your bowel movements to look for signs of improvement or worsening.  SEEK MEDICAL CARE IF:   · You do not improve in the time expected.  · Your condition worsens after initial improvement.  · You develop any new symptoms.  SEEK IMMEDIATE MEDICAL CARE IF:   · You develop severe or prolonged rectal bleeding.  · You vomit blood.  · You feel weak or faint.  · You have a fever.  MAKE SURE YOU:  · Understand these instructions.  · Will watch your condition.  · Will get help right away if you are not doing well or get worse.    Document Released: 07/14/2002 Document Revised: 10/16/2011 Document Reviewed: 12/09/2010  Exit[employer]® Patient Information ©2015 [employer]. This information is not intended to replace advice given to you by your health care provider. Make sure you discuss any questions you have with your health care provider.

## 2015-03-23 ENCOUNTER — Encounter (HOSPITAL_COMMUNITY): Payer: Self-pay | Admitting: Emergency Medicine

## 2015-03-23 ENCOUNTER — Emergency Department (HOSPITAL_COMMUNITY)
Admission: EM | Admit: 2015-03-23 | Discharge: 2015-03-23 | Disposition: A | Discharge status: Home / Self Care | Payer: BC Managed Care – PPO | Attending: Emergency Medicine | Admitting: Emergency Medicine

## 2015-03-23 DIAGNOSIS — F1320 Sedative, hypnotic or anxiolytic dependence, uncomplicated: Secondary | ICD-10-CM

## 2015-03-23 DIAGNOSIS — R197 Diarrhea, unspecified: Secondary | ICD-10-CM | POA: Insufficient documentation

## 2015-03-23 DIAGNOSIS — E559 Vitamin D deficiency, unspecified: Secondary | ICD-10-CM | POA: Insufficient documentation

## 2015-03-23 DIAGNOSIS — R110 Nausea: Secondary | ICD-10-CM | POA: Diagnosis not present

## 2015-03-23 DIAGNOSIS — F329 Major depressive disorder, single episode, unspecified: Secondary | ICD-10-CM | POA: Diagnosis not present

## 2015-03-23 DIAGNOSIS — G479 Sleep disorder, unspecified: Secondary | ICD-10-CM | POA: Diagnosis not present

## 2015-03-23 DIAGNOSIS — R079 Chest pain, unspecified: Secondary | ICD-10-CM | POA: Diagnosis not present

## 2015-03-23 DIAGNOSIS — K5900 Constipation, unspecified: Secondary | ICD-10-CM | POA: Diagnosis not present

## 2015-03-23 DIAGNOSIS — F32A Depression, unspecified: Secondary | ICD-10-CM

## 2015-03-23 DIAGNOSIS — Z86010 Personal history of colonic polyps: Secondary | ICD-10-CM | POA: Insufficient documentation

## 2015-03-23 DIAGNOSIS — Z008 Encounter for other general examination: Secondary | ICD-10-CM | POA: Diagnosis present

## 2015-03-23 DIAGNOSIS — R51 Headache: Secondary | ICD-10-CM | POA: Diagnosis not present

## 2015-03-23 DIAGNOSIS — Z8619 Personal history of other infectious and parasitic diseases: Secondary | ICD-10-CM | POA: Diagnosis not present

## 2015-03-23 DIAGNOSIS — Z79899 Other long term (current) drug therapy: Secondary | ICD-10-CM | POA: Diagnosis not present

## 2015-03-23 DIAGNOSIS — F419 Anxiety disorder, unspecified: Secondary | ICD-10-CM | POA: Diagnosis not present

## 2015-03-23 DIAGNOSIS — E780 Pure hypercholesterolemia: Secondary | ICD-10-CM | POA: Diagnosis not present

## 2015-03-23 NOTE — ED Notes (Addendum)
Pt is extremely emotional and wanting to stop taking her klonopin. Pt lost her spouse and has been out of work dealing with all of this for past 3 months. DWhen patients stops taking it she has diarrhea. Pt is wanting to have detox for her klonopin in an inpatient setting. Denies any SI or HI. Friend at bedside.

## 2015-03-23 NOTE — ED Provider Notes (Signed)
CSN: [id number]     Arrival date & time 03/23/15  1004 History   First MD Initiated Contact with Patient 03/23/15 1029     No chief complaint on file.    (Consider location/radiation/quality/duration/timing/severity/associated sxs/prior Treatment) HPI Comments: [name] is a 64 y/o F with a pmhx of anxiety and depression who presents today requesting a klonopin detox. Pt states that she has been taking  klonopin for several years for anxiety. However, when her husband passed away this past 07/20/23 she felt like her Klonopin was no longer working for her. Pt sees a psychiatrist regularly who told her that she was unable to increaser her dosage of Klonopin and that she would need to come off of it before she could be managed with a different drug. Pt has attempted to taper herself off of the Klonopin since 01/23/15 and states that she experiences insomnia, diarrhea, nausea, and headaches as she tries to taper. Pt does not feel that she is capable of detoxing herself off the Klonopin and feels that she needs inpatient assistance in detoxing. Denies chest pain, SOB, vomiting, visual disturbances, seizures. Pt also takes 3 Librium pills daily. Pt lives alone. Pt denies suicidal or homicidal ideation. Denies tobacco, alcohol, or recreational drug use.  The history is provided by the patient and a friend.    Past Medical History  Diagnosis Date  . Hypercholesteremia   . Vitamin D deficiency   . Constipation   . Depression   . Hx of colonic polyps   . Diverticulosis   . H. pylori infection    Past Surgical History  Procedure Laterality Date  . Colonoscopy  2012   History reviewed. No pertinent family history. Social History  Substance Use Topics  . Smoking status: Never Smoker   . Smokeless tobacco: None  . Alcohol Use: Yes   OB History    No data available     Review of Systems  Constitutional: Positive for fatigue and unexpected weight change ( pt has lose 20 lbs since  husband passed away in 2023/07/20). Negative for fever, chills and diaphoresis.  HENT: Negative for congestion, postnasal drip, rhinorrhea, sinus pressure, trouble swallowing and voice change.   Eyes: Negative for visual disturbance.  Respiratory: Positive for chest tightness. Negative for apnea, cough, choking, shortness of breath and wheezing.   Cardiovascular: Negative for chest pain, palpitations and leg swelling.  Gastrointestinal: Positive for nausea and diarrhea. Negative for vomiting, abdominal pain, constipation, blood in stool, abdominal distention and rectal pain.  Genitourinary: Negative for urgency, hematuria, difficulty urinating and pelvic pain.  Musculoskeletal: Negative for myalgias, back pain, joint swelling and arthralgias.  Skin: Negative for pallor, rash and wound.  Neurological: Positive for headaches. Negative for dizziness, seizures, syncope, weakness and light-headedness.  Psychiatric/Behavioral: Positive for sleep disturbance. Negative for suicidal ideas, hallucinations, confusion and self-injury. The patient is nervous/anxious. The patient is not hyperactive.   All other systems reviewed and are negative.     Allergies  Salicylates  Home Medications   Prior to Admission medications   Medication Sig Start Date End Date Taking? Authorizing Provider  acetaminophen (TYLENOL) 500 MG tablet Take 500 mg by mouth every 6 (six) hours as needed for mild pain.    Historical Provider, MD  Calcium Carb-Cholecalciferol (CALCIUM 600 + D PO) Take 1 tablet by mouth daily.    Historical Provider, MD  clonazePAM (KLONOPIN) 2 MG tablet Take 2 mg by mouth at bedtime.     Historical Provider, MD  cycloSPORINE (  RESTASIS) 0.05 % ophthalmic emulsion Place 1 drop into both eyes 2 (two) times daily.    Historical Provider, MD  eletriptan (RELPAX) 20 MG tablet Take 20 mg by mouth 2 (two) times daily as needed for migraine or headache. May repeat in 2 hours if headache persists or recurs.     Historical Provider, MD  Linaclotide Karlene Einstein) 290 MCG CAPS capsule Take 290 mcg by mouth every morning.    Historical Provider, MD  Omega-3 Fatty Acids (FISH OIL) 1000 MG CAPS Take 1,000 mg by mouth daily.     Historical Provider, MD  polyethylene glycol (MIRALAX / GLYCOLAX) packet Take 17 g by mouth daily as needed (constipation).    Historical Provider, MD  Vitamin D, Ergocalciferol, (DRISDOL) 50000 UNITS CAPS Take 50,000 Units by mouth every 7 (seven) days. saturday    Historical Provider, MD   BP 139/82 mmHg  Pulse 71  Temp(Src) 97.7 F (36.5 C) (Oral)  Resp 16  SpO2 100% Physical Exam  Constitutional: She is oriented to person, place, and time. She appears well-developed and well-nourished. No distress.  HENT:  Head: Normocephalic and atraumatic.  Eyes: Conjunctivae and EOM are normal. Pupils are equal, round, and reactive to light. Right eye exhibits no discharge. Left eye exhibits no discharge. No scleral icterus.  Cardiovascular: Normal rate, regular rhythm, normal heart sounds and intact distal pulses.  Exam reveals no gallop and no friction rub.   No murmur heard. Pulmonary/Chest: Effort normal and breath sounds normal. No respiratory distress. She has no wheezes. She has no rales. She exhibits no tenderness.  Abdominal: Soft. Bowel sounds are normal. She exhibits no distension and no mass. There is no tenderness. There is no rebound and no guarding.  Musculoskeletal: Normal range of motion. She exhibits no edema.  Lymphadenopathy:    She has no cervical adenopathy.  Neurological: She is alert and oriented to person, place, and time. No cranial nerve deficit.  Skin: Skin is warm and dry. No rash noted. She is not diaphoretic. No erythema. No pallor.  Psychiatric:  Pt tearful during exam when talking about deceased husband.   Vitals reviewed.   ED Course  Procedures (including critical care time)  Pt seen  Pt requested inpatient treatment for detox Spoke with Dr. Evelene Croon  office (pt psychiatrist). Per the pt she was instructed by Dr. Evelene Croon to come to ED. Nursing staff at psychiatrist office states that they do not think she needs inpatient treatment. Recommend for pt to follow up outpatient for med management.  Relayed information to pt. Pt dishcarged  Labs Review  Labs Reviewed - No data to display  Imaging Review No results found. I have personally reviewed and evaluated these images and lab results as part of my medical decision-making.   EKG Interpretation None      MDM   Final diagnoses:  Depression  Benzodiazepine dependence    Pt seen with request for inpatient detox off Klonopin. Pt has been tapering off of Klonopin per instructions from psychiatrist. Pt having difficulty with tapering stating she is unable to sleep. Pt is also on Librium. Withdrawal with risk of seizure unlikely since pt is still taking Librium. Dose of Klonopin is 1mg , also making seizure activity unlikely. Per pt psychiatrist still wants her to take Librium. Cannot detox pt off of one benzo while keeping her on another. Pt is medically stable. No medical indication for admission. Recommend follow up outpatient with psychiatrist for med management.     Ameshia Pewitt Tripp Deva Ron,  PA-C 03/23/15 1217  Raeford Razor, MD 03/25/15 [id number]

## 2015-03-23 NOTE — Discharge Instructions (Signed)
Benzodiazepine Withdrawal  °Benzodiazepines are a group of drugs that are prescribed for both short-term and long-term treatment of a variety of medical conditions. For some of these conditions, such as seizures and sudden and severe muscle spasms, they are used only for a few hours or a few days. For other conditions, such as anxiety, sleep problems, or frequent muscle spasms or to help prevent seizures, they are used for an extended period, usually weeks or months. °Benzodiazepines work by changing the way your brain functions. Normally, chemicals in your brain called neurotransmitters send messages between your brain cells. The neurotransmitter that benzodiazepines affect is called gamma-aminobutyric acid (GABA). GABA sends out messages that have a calming effect on many of the functions of your brain. Benzodiazepines make these messages stronger and increase this calming effect. °Short-term use of benzodiazepines usually does not cause problems when you stop taking the drugs. However, if you take benzodiazepines for a long time, your body can adjust to the drug and require more of it to produce the same effect (drug tolerance). Eventually, you can develop physical dependence on benzodiazepines, which is when you experience negative effects if your dosage of benzodiazepines is reduced or stopped too quickly. These negative effects are called symptoms of withdrawal. °SYMPTOMS °Symptoms of withdrawal may begin anytime within the first 10 days after you stop taking the benzodiazepine. They can last from several weeks up to a few months but usually are the worst between the first 10 to 14 days.  °The actual symptoms also vary, depending on the type of benzodiazepine you take. Possible symptoms include: °· Anxiety. °· Excitability. °· Irritability. °· Depression. °· Mood swings. °· Trouble sleeping. °· Confusion. °· Uncontrollable shaking (tremors). °· Muscle weakness. °· Seizures. °DIAGNOSIS °To diagnose  benzodiazepine withdrawal, your caregiver will examine you for certain signs, such as: °· Rapid heartbeat. °· Rapid breathing. °· Tremors. °· High blood pressure. °· Fever. °· Mood changes. °Your caregiver also may ask the following questions about your use of benzodiazepines: °· What type of benzodiazepine did you take? °· How much did you take each day? °· How long did you take the drug? °· When was the last time you took the drug? °· Do you take any other drugs? °· Have you had alcohol recently? °· Have you had a seizure recently? °· Have you lost consciousness recently? °· Have you had trouble remembering recent events? °· Have you had a recent increase in anxiety, irritability, or trouble sleeping? °A drug test also may be administered. °TREATMENT °The treatment for benzodiazepine withdrawal can vary, depending on the type and severity of your symptoms, what type of benzodiazepine you have been taking, and how long you have been taking the benzodiazepine. Sometimes it is necessary for you to be treated in a hospital, especially if you are at risk of seizures.  °Often, treatment includes a prescription for a long-acting benzodiazepine, the dosage of which is reduced slowly over a long period. This period could be several weeks or months. Eventually, your dosage will be reduced to a point that you can stop taking the drug, without experiencing withdrawal symptoms. This is called tapered withdrawal. Occasionally, minor symptoms of withdrawal continue for a few days or weeks after you have completed a tapered withdrawal. °SEEK IMMEDIATE MEDICAL CARE IF: °· You have a seizure. °· You develop a craving for drugs or alcohol. °· You begin to experience symptoms of withdrawal during your tapered withdrawal. °· You become very confused. °· You lose consciousness. °· You   have trouble breathing. °· You think about hurting yourself or someone else. °Document Released: 07/13/2011 Document Revised: 10/16/2011 Document  Reviewed: 07/13/2011 °[employer]® Patient Information ©2015 [employer]. This information is not intended to replace advice given to you by your health care provider. Make sure you discuss any questions you have with your health care provider. ° °

## 2015-06-10 ENCOUNTER — Other Ambulatory Visit: Payer: Self-pay

## 2015-06-10 DIAGNOSIS — Z1231 Encounter for screening mammogram for malignant neoplasm of breast: Secondary | ICD-10-CM

## 2015-07-21 ENCOUNTER — Ambulatory Visit
Admission: RE | Admit: 2015-07-21 | Discharge: 2015-07-21 | Disposition: A | Discharge status: Home / Self Care | Payer: BC Managed Care – PPO | Source: Ambulatory Visit

## 2015-07-21 DIAGNOSIS — Z1231 Encounter for screening mammogram for malignant neoplasm of breast: Secondary | ICD-10-CM

## 2016-03-17 ENCOUNTER — Other Ambulatory Visit: Payer: Self-pay

## 2016-03-17 ENCOUNTER — Ambulatory Visit (INDEPENDENT_AMBULATORY_CARE_PROVIDER_SITE_OTHER): Payer: BC Managed Care – PPO | Admitting: Neurology

## 2016-03-17 ENCOUNTER — Encounter: Payer: Self-pay | Admitting: Neurology

## 2016-03-17 ENCOUNTER — Telehealth: Payer: Self-pay | Admitting: Neurology

## 2016-03-17 VITALS — BP 132/74 | HR 80 | Ht 59.0 in | Wt 124.0 lb

## 2016-03-17 DIAGNOSIS — G43019 Migraine without aura, intractable, without status migrainosus: Secondary | ICD-10-CM | POA: Diagnosis not present

## 2016-03-17 DIAGNOSIS — R51 Headache: Secondary | ICD-10-CM

## 2016-03-17 DIAGNOSIS — F4323 Adjustment disorder with mixed anxiety and depressed mood: Secondary | ICD-10-CM

## 2016-03-17 DIAGNOSIS — R32 Unspecified urinary incontinence: Secondary | ICD-10-CM | POA: Insufficient documentation

## 2016-03-17 DIAGNOSIS — R519 Headache, unspecified: Secondary | ICD-10-CM | POA: Insufficient documentation

## 2016-03-17 DIAGNOSIS — H04129 Dry eye syndrome of unspecified lacrimal gland: Secondary | ICD-10-CM | POA: Insufficient documentation

## 2016-03-17 DIAGNOSIS — G43909 Migraine, unspecified, not intractable, without status migrainosus: Secondary | ICD-10-CM | POA: Insufficient documentation

## 2016-03-17 DIAGNOSIS — E785 Hyperlipidemia, unspecified: Secondary | ICD-10-CM | POA: Insufficient documentation

## 2016-03-17 MED ORDER — SUMATRIPTAN SUCCINATE 100 MG PO TABS: ORAL_TABLET | ORAL | 2 refills | Status: DC

## 2016-03-17 MED ORDER — ATENOLOL 50 MG PO TABS: 50.0000 mg | ORAL_TABLET | Freq: Every day | ORAL | 2 refills | Status: DC

## 2016-03-17 MED ORDER — PROMETHAZINE HCL 25 MG PO TABS: 25.0000 mg | ORAL_TABLET | Freq: Four times a day (QID) | ORAL | 2 refills | Status: DC | PRN

## 2016-03-17 NOTE — Patient Instructions (Signed)
Migraine Recommendations: 1.  Start atenolol  every morning.  Call in 4 weeks with update and we can adjust dose if needed. 2.  Take sumatriptan  at earliest onset of headache.  May repeat dose once in 2 hours if needed.  Do not exceed two tablets in 24 hours. 3.  Stop tramadol.  Limit use of pain relievers to no more than 2 days out of the week.  These medications include acetaminophen, ibuprofen, triptans and narcotics.  This will help reduce risk of rebound headaches. 4.  Be aware of common food triggers such as processed sweets, processed foods with nitrites (such as deli meat, hot dogs, sausages), foods with MSG, alcohol (such as wine), chocolate, certain cheeses, certain fruits (dried fruits, some citrus fruit), vinegar, diet soda. 4.  Avoid caffeine 5.  Routine exercise 6.  Proper sleep hygiene 7.  Stay adequately hydrated with water 8.  Keep a headache diary. 9.  Maintain proper stress management. 10.  Do not skip meals. 11.  Consider supplements:  Magnesium oxide  to  daily, riboflavin , Coenzyme Q 10  three times daily 12.  Will try to get pre-authorization for Botox 13.  Follow up in 3 months or for first round of Botox.  Call in 4 weeks with update.  Some other preventative medications to consider would be:  Depakote, Effexor, Zoloft.  Nortriptyline is another option as long as you don't take tramadol.   Migraine Headache A migraine headache is an intense, throbbing pain on one or both sides of your head. A migraine can last for 30 minutes to several hours. CAUSES  The exact cause of a migraine headache is not always known. However, a migraine may be caused when nerves in the brain become irritated and release chemicals that cause inflammation. This causes pain. Certain things may also trigger migraines, such as:  Alcohol.  Smoking.  Stress.  Menstruation.  Aged cheeses.  Foods or drinks that contain nitrates, glutamate, aspartame, or  tyramine.  Lack of sleep.  Chocolate.  Caffeine.  Hunger.  Physical exertion.  Fatigue.  Medicines used to treat chest pain (nitroglycerine), birth control pills, estrogen, and some blood pressure medicines. SIGNS AND SYMPTOMS  Pain on one or both sides of your head.  Pulsating or throbbing pain.  Severe pain that prevents daily activities.  Pain that is aggravated by any physical activity.  Nausea, vomiting, or both.  Dizziness.  Pain with exposure to bright lights, loud noises, or activity.  General sensitivity to bright lights, loud noises, or smells. Before you get a migraine, you may get warning signs that a migraine is coming (aura). An aura may include:  Seeing flashing lights.  Seeing bright spots, halos, or zigzag lines.  Having tunnel vision or blurred vision.  Having feelings of numbness or tingling.  Having trouble talking.  Having muscle weakness. DIAGNOSIS  A migraine headache is often diagnosed based on:  Symptoms.  Physical exam.  A CT scan or MRI of your head. These imaging tests cannot diagnose migraines, but they can help rule out other causes of headaches. TREATMENT Medicines may be given for pain and nausea. Medicines can also be given to help prevent recurrent migraines.  HOME CARE INSTRUCTIONS  Only take over-the-counter or prescription medicines for pain or discomfort as directed by your health care provider. The use of long-term narcotics is not recommended.  Lie down in a dark, quiet room when you have a migraine.  Keep a journal to find out what may  trigger your migraine headaches. For example, write down:  What you eat and drink.  How much sleep you get.  Any change to your diet or medicines.  Limit alcohol consumption.  Quit smoking if you smoke.  Get 7-9 hours of sleep, or as recommended by your health care provider.  Limit stress.  Keep lights dim if bright lights bother you and make your migraines  worse. SEEK IMMEDIATE MEDICAL CARE IF:   Your migraine becomes severe.  You have a fever.  You have a stiff neck.  You have vision loss.  You have muscular weakness or loss of muscle control.  You start losing your balance or have trouble walking.  You feel faint or pass out.  You have severe symptoms that are different from your first symptoms. MAKE SURE YOU:   Understand these instructions.  Will watch your condition.  Will get help right away if you are not doing well or get worse.   This information is not intended to replace advice given to you by your health care provider. Make sure you discuss any questions you have with your health care provider.   Document Released: 07/24/2005 Document Revised: 08/14/2014 Document Reviewed: 03/31/2013 Elsevier Interactive Patient Education [employer]

## 2016-03-17 NOTE — Progress Notes (Signed)
Chart forwarded.  

## 2016-03-17 NOTE — Telephone Encounter (Signed)
Please call patient before 4:00 has several questions about medication and side effect she is not having side effects not and about a medication that was suppose to called in please call [phone] patient will be leaving around 4 to go to another dr appt

## 2016-03-17 NOTE — Progress Notes (Signed)
NEUROLOGY CONSULTATION NOTE  [name] MRN: [id number] DOB: September 14, 1950  Referring provider: Dr. Kevan Ny Primary care provider: Dr. Kevan Ny   Reason for consult:  migraines  HISTORY OF PRESENT ILLNESS: [name] is a 65 year old right-handed woman with depression and anxiety with history of benzodiazepine and barbiturates dependence and gastric ulcer who presents for migraines.  History obtained by patient and PCP note.  Onset:  She has remote history of migraines which were controlled for many years.  In December 2015, her husband unexpectedly passed away, which triggered depression and anxiety.  She subsequently recovered.  However, in early June, she witnessed her boyfriend's ill father pass away.  It triggered flashbacks of her husband's death, which increased depression, anxiety and migraines.  She has significant history of side effects to multiple medications. Location:  Left peri-orbital Quality:  pounding Intensity:  10/10 Aura:  no Prodrome:  no Associated symptoms:  Nausea, photophobia, phonophobia, osmophobia Duration:  All day Frequency:  15 days per month Triggers/exacerbating factors:  Stress, light Relieving factors:  Tramadol helps relieve it. Activity:  aggravates  Past NSAIDS:  Contraindicated due to bleeding ulcer Past analgesics:  Cannot take ASA products such as Excedrin due to bleeding ulcer Past abortive triptans:  Relpax (took it multiple times but caused chest tightness once, but was effective), sumatriptan (many years ago) Past anti-emetic:  Zofran  (effective) Past anti-anxiolytic:  benzodiazepines Past antihypertensive medications:  propranolol (chest tightness Past antidepressant medications:  Multiple, such as Cymbalta.  She has had intolerance to multiple antidepressants over the years. Past anticonvulsant medications:  topiramate (chest tightness), Depakote (many years ago) Past vitamins/Herbal/Supplements:  no  Current  NSAIDS:  contraindicated Current analgesics:  Tylenol (first line), tramadol (second line, helps relieve pain but does not want to take opioids) Current triptans:  no Current anti-emetic:  Zofran  Current muscle relaxants:  no Current anti-anxiolytic:  Librium Current sleep aide:  Librium Current Antihypertensive medications:  no Current Antidepressant medications:  no Current Anticonvulsant medications:  no Current Vitamins/Herbal/Supplements:  no Current Antihistamines/Decongestants:  no Other medication:  Estradiol  Caffeine:  1 cup coffee daily Alcohol:  no Smoker:  no Diet:  hydrates Exercise:  Can no longer walk due to pain Depression/stress:  yes Sleep hygiene:  poor Family history of headache:  Mom, sister  MRI and MRA of head from 11/05/10 were personally reviewed and were unremarkable.  PAST MEDICAL HISTORY: Past Medical History:  Diagnosis Date  . Constipation   . Depression   . Diverticulosis   . H. pylori infection   . Hx of colonic polyps   . Hypercholesteremia   . Vitamin D deficiency     PAST SURGICAL HISTORY: Past Surgical History:  Procedure Laterality Date  . COLONOSCOPY  2012    MEDICATIONS: Current Outpatient Prescriptions on File Prior to Visit  Medication Sig Dispense Refill  . acetaminophen (TYLENOL) 500 MG tablet Take 500 mg by mouth every 6 (six) hours as needed for mild pain.    . Omega-3 Fatty Acids (FISH OIL) 1000 MG CAPS Take 1,000 mg by mouth daily.     . Calcium Carb-Cholecalciferol (CALCIUM 600 + D PO) Take 1 tablet by mouth daily.    . cycloSPORINE (RESTASIS) 0.05 % ophthalmic emulsion Place 1 drop into both eyes 2 (two) times daily.    . Linaclotide (LINZESS) 290 MCG CAPS capsule Take 290 mcg by mouth every morning.    . polyethylene glycol (MIRALAX / GLYCOLAX) packet Take 17 g by mouth  daily as needed (constipation).    . Vitamin D, Ergocalciferol, (DRISDOL) 50000 UNITS CAPS Take 50,000 Units by mouth every 7 (seven) days.  saturday     No current facility-administered medications on file prior to visit.     ALLERGIES: Allergies  Allergen Reactions  . Other     Other reaction(s): Other (See Comments) Cholesterol meds-multiple reactions to multiple meds  . Salicylates Other (See Comments)    Told not to take due to hx of GI ulcer    FAMILY HISTORY: Family History  Problem Relation Age of Onset  . Migraines Mother   . Migraines Sister     SOCIAL HISTORY: Social History   Social History  . Marital status: Married    Spouse name: N/A  . Number of children: N/A  . Years of education: N/A   Occupational History  . LPN    Social History Main Topics  . Smoking status: Never Smoker  . Smokeless tobacco: Not on file  . Alcohol use Yes  . Drug use: No  . Sexual activity: Not on file   Other Topics Concern  . Not on file   Social History Narrative   Pt has L.P.N through [facility]    REVIEW OF SYSTEMS: Constitutional: No fevers, chills, or sweats, no generalized fatigue, change in appetite Eyes: No visual changes, double vision, eye pain Ear, nose and throat: No hearing loss, ear pain, nasal congestion, sore throat Cardiovascular: No chest pain, palpitations Respiratory:  No shortness of breath at rest or with exertion, wheezes GastrointestinaI: No nausea, vomiting, diarrhea, abdominal pain, fecal incontinence Genitourinary:  No dysuria, urinary retention or frequency Musculoskeletal:  No neck pain, back pain Integumentary: No rash, pruritus, skin lesions Neurological: as above Psychiatric: depression, insomnia, anxiety Endocrine: No palpitations, fatigue, diaphoresis, mood swings, change in appetite, change in weight, increased thirst Hematologic/Lymphatic:  No purpura, petechiae. Allergic/Immunologic: no itchy/runny eyes, nasal congestion, recent allergic reactions, rashes  PHYSICAL EXAM: Vitals:   03/17/16 0751  BP: 132/74  Pulse: 80   General: Some distress.  Currently with  migraine.  Patient appears well-groomed.  Head:  Normocephalic/atraumatic Eyes:  fundi examined but not visualized Neck: supple, no paraspinal tenderness, full range of motion Back: No paraspinal tenderness Heart: regular rate and rhythm Lungs: Clear to auscultation bilaterally. Vascular: No carotid bruits. Neurological Exam: Mental status: alert and oriented to person, place, and time, recent and remote memory intact, fund of knowledge intact, attention and concentration intact, speech fluent and not dysarthric, language intact. Cranial nerves: CN I: not tested CN II: pupils equal, round and reactive to light, visual fields intact CN III, IV, VI:  full range of motion, no nystagmus, no ptosis CN V: facial sensation intact CN VII: upper and lower face symmetric CN VIII: hearing intact CN IX, X: gag intact, uvula midline CN XI: sternocleidomastoid and trapezius muscles intact CN XII: tongue midline Bulk & Tone: normal, no fasciculations. Motor:  5/5 throughout  Sensation: temperature and vibration sensation intact. Deep Tendon Reflexes:  2+ throughout, toes downgoing.  Finger to nose testing:  Without dysmetria.  Heel to shin:  Without dysmetria.  Gait:  Normal station and stride.  Able to turn and tandem walk. Romberg negative.  IMPRESSION: Intractable migraines Depression and anxiety   PLAN: 1.  Even though she has had only 2 months of frequent migraines, I believe she should be a Botox candidate as she is sensitive to many medications and has had side effects to multiple migraine preventative medications.  She  also requires treatment from psychiatry, so I don't want to be prescribing different antidepressants. 2.  In the meantime, we will start atenolol 50mg  daily.   3.  For abortive therapy, we will try sumatriptan 50mg .  She reports chest tightness with Relpax.  However, that only happened once and she had used Relpax several times without incident.  Also, she had similar  symptoms with other medications of different class, which leads me to believe that it may be related to anxiety rather than medication side effect. 4.  Lifestyle modification such as sleep hygiene discussed.  Treatment for depression and anxiety as per psychiatry. 5.  Follow up in 3 months or for first round of Botox (which would be in October if we get pre-approval).  60 minutes spent face to face with patient, over 50% spent counseling.   Thank you for allowing me to take part in the care of this patient.  Shon Millet, DO  CC:  Shaune Pollack, MD

## 2016-03-17 NOTE — Telephone Encounter (Signed)
Spoke with patient. Pt wanted to know what to do when the atenolol causes her chest tightness. Informed pt to discontinue medication if she experienced any side effects from medication and to call the office on Monday. Pt then asked about a prescription for phenergan that was supposed to have been sent to pharmacy. Reviewed note. Did not see any mention. Will wait for a response from provider before [facility]. Okay to send RX in? Please advise.

## 2016-03-19 NOTE — Telephone Encounter (Signed)
Ok to prescribe atenolol

## 2016-03-20 MED ORDER — PROMETHAZINE HCL 25 MG PO TABS: 25.0000 mg | ORAL_TABLET | Freq: Four times a day (QID) | ORAL | 2 refills | Status: DC | PRN

## 2016-03-20 NOTE — Telephone Encounter (Signed)
yes

## 2016-03-20 NOTE — Telephone Encounter (Signed)
Did you mean phenergan?

## 2016-03-30 ENCOUNTER — Telehealth: Payer: Self-pay

## 2016-03-30 NOTE — Telephone Encounter (Signed)
Attempted to reach pt. No answer.

## 2016-03-30 NOTE — Telephone Encounter (Signed)
Pt stated that you had given her a few preventative options at her last appointment on 03/17/16.  Per your patient instructions: "Some other preventative medications to consider would be:  Depakote, Effexor, Zoloft.  Nortriptyline is another option as long as you don't take tramadol."  Pt stated that her appointment was too early in the morning to make a decision. However after she left here she had a 5 p.m. Appointment with Dr. Sharl MaKerr her psychiatrist. She was started on Depakote 250 mg at that appointment with the sig "take 1-3 tablets QHS". Pt stated she has been taking 3 tablets at night (750 mg). Pt has had no migraine in 7 days!!!! However, she is experiencing some grogginess that has started to negatively affect her life.  Pt has called Dr. Sharl MaKerr back about this issue, but has been told (per pt) that Dr. Sharl MaKerr does not treat headaches, and to stop calling about that medication. Pt stated she then had her annual exam with Dr. Kevan NyGates who told her to call us about side effects. Pt would like to decrease medication to two tablets to see if this reduces grogginess, but is concerned about her headaches restarting. Told pt that unfortunately it is a trial and error to find the right medication/dosage to treat headaches and that she would have to be patient. Pt is also not taking atenolol, as she didn't want to start two medication at the same time, and didn't think she needed two preventatives. Please advise.

## 2016-03-30 NOTE — Telephone Encounter (Signed)
Okay to stop atenolol.  Take depakote ER.  I would take only 250mg  at bedtime.  If headaches persists and she is tolerating, then I would increase dose to 500mg  at bedtime.

## 2016-03-30 NOTE — Telephone Encounter (Signed)
Spoke with patient. Message relayed. Will call back 1 month with headache update.

## 2016-04-12 ENCOUNTER — Telehealth: Payer: Self-pay | Admitting: Neurology

## 2016-04-12 NOTE — Telephone Encounter (Signed)
Dr. Jaffe patient 

## 2016-04-12 NOTE — Telephone Encounter (Signed)
Message relayed to patient. Verbalized understanding and denied questions.   

## 2016-04-12 NOTE — Telephone Encounter (Signed)
Since she has had an improvement on Depakote, I would recommend trying to increase dose back up to 750mg  at bedtime.  If she is concerned about grogginess, then I would recommend Botox as she has such high sensitivity to medications.

## 2016-04-12 NOTE — Telephone Encounter (Signed)
It appears she has only been on the Depakote for 2 weeks.  I would recommend continuing current dose for another 2 weeks and then reassess.  She should contact me at that time and we can make changes if necessary.

## 2016-04-12 NOTE — Telephone Encounter (Signed)
Pt called with update on headaches. Pt states she is taking Depakote 500 mg QHS. Pt stated on this doseage grogginess is better. Migraines are much better. She is able to drive again. Pt is complaining of very dry mouth, headaches daily between hours of 5 pm - 8 pm, and complaints of sleep issues after dropping from 750 mg to 500 mg of her depakote. Pt stated she never lowered to the 250 mg QHS as requested because she was still having some pain at 500 mg and did not want the pain to get any worse. Please advise.

## 2016-04-12 NOTE — Telephone Encounter (Signed)
PT left a message to have a call back regarding her headaches/Dawn CB# [phone]

## 2016-04-17 ENCOUNTER — Telehealth: Payer: Self-pay

## 2016-04-17 NOTE — Telephone Encounter (Signed)
Pa Initiated

## 2016-04-19 ENCOUNTER — Telehealth: Payer: Self-pay

## 2016-06-02 ENCOUNTER — Ambulatory Visit (INDEPENDENT_AMBULATORY_CARE_PROVIDER_SITE_OTHER): Payer: BC Managed Care – PPO | Admitting: Neurology

## 2016-06-02 ENCOUNTER — Other Ambulatory Visit (INDEPENDENT_AMBULATORY_CARE_PROVIDER_SITE_OTHER): Payer: BC Managed Care – PPO

## 2016-06-02 ENCOUNTER — Encounter: Payer: Self-pay | Admitting: Neurology

## 2016-06-02 VITALS — BP 144/80 | HR 78 | Ht 59.0 in | Wt 134.0 lb

## 2016-06-02 DIAGNOSIS — G43709 Chronic migraine without aura, not intractable, without status migrainosus: Secondary | ICD-10-CM

## 2016-06-02 LAB — HEPATIC FUNCTION PANEL
ALBUMIN: 4.3 g/dL (ref 3.5–5.2)
ALK PHOS: 58 U/L (ref 39–117)
ALT: 14 U/L (ref 0–35)
AST: 19 U/L (ref 0–37)
Bilirubin, Direct: 0.1 mg/dL (ref 0.0–0.3)
Total Bilirubin: 0.4 mg/dL (ref 0.2–1.2)
Total Protein: 6.6 g/dL (ref 6.0–8.3)

## 2016-06-02 LAB — CBC
HEMATOCRIT: 35.8 % — AB (ref 36.0–46.0)
HEMOGLOBIN: 12.3 g/dL (ref 12.0–15.0)
MCHC: 34.3 g/dL (ref 30.0–36.0)
MCV: 94.9 fl (ref 78.0–100.0)
Platelets: 214 10*3/uL (ref 150.0–400.0)
RBC: 3.77 Mil/uL — ABNORMAL LOW (ref 3.87–5.11)
RDW: 12.6 % (ref 11.5–15.5)
WBC: 4.9 10*3/uL (ref 4.0–10.5)

## 2016-06-02 NOTE — Progress Notes (Signed)
NEUROLOGY FOLLOW UP OFFICE NOTE  [name] [id number]  HISTORY OF PRESENT ILLNESS: [name] is a 65 year old woman who follows up for migraine.    She was supposed to return for first round of Botox, but she is having second thoughts.  She has been on Depakote ER 500mg  daily.  Severe headaches have resolved, but she still has daily headache in the afternoon and evening.  PAST MEDICAL HISTORY: Past Medical History:  Diagnosis Date  . Constipation   . Depression   . Diverticulosis   . H. pylori infection   . Hx of colonic polyps   . Hypercholesteremia   . Vitamin D deficiency     MEDICATIONS: Current Outpatient Prescriptions on File Prior to Visit  Medication Sig Dispense Refill  . acetaminophen (TYLENOL) 500 MG tablet Take 500 mg by mouth every 6 (six) hours as needed for mild pain.    [name]. atenolol (TENORMIN) 50 MG tablet Take 1 tablet (50 mg total) by mouth daily. 30 tablet 2  . Calcium Carb-Cholecalciferol (CALCIUM 600 + D PO) Take 1 tablet by mouth daily.    . chlordiazePOXIDE (LIBRIUM) 25 MG capsule     . chlordiazePOXIDE (LIBRIUM) 5 MG capsule     . cycloSPORINE (RESTASIS) 0.05 % ophthalmic emulsion Place 1 drop into both eyes 2 (two) times daily.    [name]. docusate sodium (COLACE) 100 MG capsule Take by mouth.    . Estradiol (VAGIFEM) 10 MCG TABS vaginal tablet Place vaginally.    . lansoprazole (PREVACID) 30 MG capsule Take by mouth.    . Linaclotide (LINZESS) 290 MCG CAPS capsule Take 290 mcg by mouth every morning.    . Omega-3 Fatty Acids (FISH OIL) 1000 MG CAPS Take 1,000 mg by mouth daily.     . ondansetron (ZOFRAN) 8 MG tablet     . polyethylene glycol (MIRALAX / GLYCOLAX) packet Take 17 g by mouth daily as needed (constipation).    . promethazine (PHENERGAN) 25 MG tablet Take 1 tablet (25 mg total) by mouth every 6 (six) hours as needed for nausea or vomiting. 30 tablet 2  . simethicone (PHAZYME) 125 MG chewable tablet Chew 250 mg by mouth every 6  (six) hours as needed for flatulence.    . SUMAtriptan (IMITREX) 100 MG tablet Take 1 tablet.  May repeat once in 2 hours if headache persists or recurs. 10 tablet 2  . Vitamin D, Ergocalciferol, (DRISDOL) 50000 UNITS CAPS Take 50,000 Units by mouth every 7 (seven) days. saturday    . XIIDRA 5 % SOLN      No current facility-administered medications on file prior to visit.     ALLERGIES: Allergies  Allergen Reactions  . Aspirin   . Crestor [Rosuvastatin Calcium] Itching  . Inderal [Propranolol] Other (See Comments)    Chest tightness  . Other     Other reaction(s): Other (See Comments) Cholesterol meds-multiple reactions to multiple meds  . Relpax [Eletriptan Hydrobromide] Other (See Comments)    Chest Tightness   . Salicylates Other (See Comments)    Told not to take due to hx of GI ulcer  . Topamax [Topiramate] Other (See Comments)    Chest tightness    FAMILY HISTORY: Family History  Problem Relation Age of Onset  . Migraines Mother   . Migraines Sister     SOCIAL HISTORY: Social History   Social History  . Marital status: Married    Spouse name: N/A  . Number of children: N/A  .  Years of education: N/A   Occupational History  . LPN    Social History Main Topics  . Smoking status: Never Smoker  . Smokeless tobacco: Never Used  . Alcohol use Yes  . Drug use: No  . Sexual activity: Not on file   Other Topics Concern  . Not on file   Social History Narrative   Pt has L.P.N through [facility]    REVIEW OF SYSTEMS: Constitutional: No fevers, chills, or sweats, no generalized fatigue, change in appetite Eyes: No visual changes, double vision, eye pain Ear, nose and throat: No hearing loss, ear pain, nasal congestion, sore throat Cardiovascular: No chest pain, palpitations Respiratory:  No shortness of breath at rest or with exertion, wheezes GastrointestinaI: No nausea, vomiting, diarrhea, abdominal pain, fecal incontinence Genitourinary:  No dysuria, urinary  retention or frequency Musculoskeletal:  No neck pain, back pain Integumentary: No rash, pruritus, skin lesions Neurological: as above Psychiatric: No depression, insomnia, anxiety Endocrine: No palpitations, fatigue, diaphoresis, mood swings, change in appetite, change in weight, increased thirst Hematologic/Lymphatic:  No purpura, petechiae. Allergic/Immunologic: no itchy/runny eyes, nasal congestion, recent allergic reactions, rashes  PHYSICAL EXAM: BP: 144/80, Pulse 78 General: No acute distress.   IMPRESSION: Chronic migraine  PLAN: 1.  We will increase Depakote ER back to 750mg  at bedtime (I will continue prescribing this) and see if she tolerates.  If not, she will reduce dose back to 500mg . 2.  Check CBC, LFTs and thyroid panel 3.  She has follow up appointment on 11/14.  If she wishes to pursue botox on that date, she will let us know a couple of days prior.  25 minutes spent face to face with patient, 100% spent counseling.  Shon Millet, DO  CC:  Shaune Pollack, MD

## 2016-06-02 NOTE — Progress Notes (Signed)
Chart forwarded to Dr. Gates.  

## 2016-06-02 NOTE — Patient Instructions (Signed)
1.  Increase Depakote to 3 pills at bedtime.  If you are unable to drive, lower it back to 2 pills at bedtime 2.  Check CBC, LFTs and thyroid panel 3.  Follow up on Tuesday, 11/14.  If you wish to pursue Botox, let us know the Thursday or Friday before

## 2016-06-05 LAB — TSH: TSH: 3.36 u[IU]/mL (ref 0.35–4.50)

## 2016-06-15 ENCOUNTER — Other Ambulatory Visit: Payer: Self-pay | Admitting: Family Medicine

## 2016-06-15 DIAGNOSIS — Z1231 Encounter for screening mammogram for malignant neoplasm of breast: Secondary | ICD-10-CM

## 2016-06-20 ENCOUNTER — Ambulatory Visit: Payer: BC Managed Care – PPO | Admitting: Neurology

## 2016-07-07 DIAGNOSIS — R87619 Unspecified abnormal cytological findings in specimens from cervix uteri: Secondary | ICD-10-CM

## 2016-07-07 HISTORY — DX: Unspecified abnormal cytological findings in specimens from cervix uteri: R87.619

## 2016-07-17 ENCOUNTER — Other Ambulatory Visit (HOSPITAL_COMMUNITY)
Admission: RE | Admit: 2016-07-17 | Discharge: 2016-07-17 | Disposition: A | Discharge status: Home / Self Care | Payer: BC Managed Care – PPO | Source: Ambulatory Visit | Attending: Family Medicine | Admitting: Family Medicine

## 2016-07-17 ENCOUNTER — Other Ambulatory Visit: Payer: Self-pay | Admitting: Family Medicine

## 2016-07-17 DIAGNOSIS — Z01411 Encounter for gynecological examination (general) (routine) with abnormal findings: Secondary | ICD-10-CM | POA: Diagnosis present

## 2016-07-17 DIAGNOSIS — Z1151 Encounter for screening for human papillomavirus (HPV): Secondary | ICD-10-CM | POA: Insufficient documentation

## 2016-07-20 ENCOUNTER — Telehealth: Payer: Self-pay | Admitting: Neurology

## 2016-07-20 ENCOUNTER — Other Ambulatory Visit: Payer: Self-pay

## 2016-07-20 LAB — CYTOLOGY - PAP
Adequacy: ABSENT — AB
HPV: NOT DETECTED

## 2016-07-20 MED ORDER — DIVALPROEX SODIUM 250 MG PO DR TAB: 750.0000 mg | DELAYED_RELEASE_TABLET | Freq: Every day | ORAL | 1 refills | Status: DC

## 2016-07-21 ENCOUNTER — Other Ambulatory Visit: Payer: Self-pay

## 2016-07-21 MED ORDER — DIVALPROEX SODIUM ER 250 MG PO TB24: 750.0000 mg | ORAL_TABLET | Freq: Every day | ORAL | 3 refills | Status: DC

## 2016-07-21 NOTE — Telephone Encounter (Signed)
1.  We will increase Depakote ER back to 750mg  at bedtime (I will continue prescribing this) and see if she tolerates.  If not, she will reduce dose back to 500mg .

## 2016-07-24 NOTE — Telephone Encounter (Signed)
error 

## 2016-07-25 ENCOUNTER — Ambulatory Visit
Admission: RE | Admit: 2016-07-25 | Discharge: 2016-07-25 | Disposition: A | Discharge status: Home / Self Care | Payer: BC Managed Care – PPO | Source: Ambulatory Visit | Attending: Family Medicine | Admitting: Family Medicine

## 2016-07-25 DIAGNOSIS — Z1231 Encounter for screening mammogram for malignant neoplasm of breast: Secondary | ICD-10-CM

## 2016-08-02 NOTE — Telephone Encounter (Signed)
Opened in error

## 2016-08-31 ENCOUNTER — Telehealth: Payer: Self-pay | Admitting: Obstetrics and Gynecology

## 2016-08-31 NOTE — Telephone Encounter (Signed)
Called and left a message for patient to call back to schedule a new patient doctor referral. °

## 2016-08-31 NOTE — Telephone Encounter (Signed)
Called and left a message for patient to call back to schedule a new patient doctor referral for cystocele.

## 2016-09-01 ENCOUNTER — Ambulatory Visit (INDEPENDENT_AMBULATORY_CARE_PROVIDER_SITE_OTHER): Payer: BC Managed Care – PPO | Admitting: Obstetrics and Gynecology

## 2016-09-01 ENCOUNTER — Encounter: Payer: Self-pay | Admitting: Obstetrics and Gynecology

## 2016-09-01 VITALS — BP 130/90 | HR 84 | Resp 14 | Ht 59.5 in | Wt 139.0 lb

## 2016-09-01 DIAGNOSIS — N8110 Cystocele, unspecified: Secondary | ICD-10-CM

## 2016-09-01 DIAGNOSIS — R350 Frequency of micturition: Secondary | ICD-10-CM

## 2016-09-01 NOTE — Progress Notes (Signed)
GYNECOLOGY  VISIT   HPI: 66 y.o.   Widow  Caucasian  female   G34P1002 with Patient's last menstrual period was 08/07/2001 (approximate).   here for vaginal prolapse treatment options.  Does extensive walking. Symptoms started in December after walking and doing some squats.  Took a picture of the vagina with her cell phone - looks like second to third degree prolapse.  Has stopped walking due to prolapse.  Just sits at home now.  Voiding every 30 - 40 minutes.  No urinary incontinence with cough or sneeze. Voiding well.  Takes Miralax daily and does splinting if she does not take this.  No fecal incontinence.  Is sexually active and prolapse is not affecting this.  Feels like her back is uncomfortable every day.  Son is marrying 11/17/22.  Husband died of MI suddenly 2 years ago.   Referred by Dr. Aldona Bar.  PCP - Dr. Shaune Pollack.   GYNECOLOGIC HISTORY: Patient's last menstrual period was 08/07/2001 (approximate). Contraception:  Post menopausal  Menopausal hormone therapy:  N/A Last mammogram:  07/26/16 BIRADS1:neg  Last pap smear:   07/17/16 CIN1/LSIL. HR HPV:Neg        OB History    Gravida Para Term Preterm AB Living   2 2 1     2    SAB TAB Ectopic Multiple Live Births           2         Patient Active Problem List   Diagnosis Date Noted  . Dry eye syndrome 03/17/2016  . Headache 03/17/2016  . Hyperlipidemia 03/17/2016  . Lack of bladder control 03/17/2016  . Migraines 03/17/2016  . 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 Abnormal Pap smear of cervix 07/2016   CIN1  . Constipation   . Depression   . Diverticulosis   . H. pylori infection   . History of blood transfusion 1984  . Hx of colonic polyps   . Hypercholesteremia   . Vitamin D deficiency     Past Surgical History:  Procedure Laterality Date  . CESAREAN SECTION  1984  . COLONOSCOPY  2012  . COSMETIC  SURGERY     Neck lift  . HEMORRHOID SURGERY  1984    Current Outpatient Prescriptions  Medication Sig Dispense Refill  . acetaminophen (TYLENOL) 500 MG tablet Take 500 mg by mouth every 6 (six) hours as needed for mild pain.    . Calcium Carb-Cholecalciferol (CALCIUM 600 + D PO) Take 1 tablet by mouth daily.    . chlordiazePOXIDE (LIBRIUM) 10 MG capsule Take 2 capsules by mouth daily.    . cyclobenzaprine (FLEXERIL) 10 MG tablet Take 1 tablet by mouth at bedtime.    . divalproex (DEPAKOTE ER) 250 MG 24 hr tablet Take 3 tablets (750 mg total) by mouth at bedtime. 90 tablet 3  . docusate sodium (COLACE) 100 MG capsule Take by mouth.    . lansoprazole (PREVACID) 30 MG capsule Take 30 mg by mouth 2 (two) times daily before a meal.     . Nutritional Supplements (VITAMIN D MAINTENANCE PO) Take 10,000 Units by mouth daily.    . Omega-3 Fatty Acids (FISH OIL) 1000 MG CAPS Take 1,000 mg by mouth daily.     . polyethylene glycol (MIRALAX / GLYCOLAX) packet Take 17 g by mouth daily as needed (constipation).    [name] XIIDRA 5 % SOLN     .  Estradiol (VAGIFEM) 10 MCG TABS vaginal tablet Place vaginally.    . simethicone (PHAZYME) 125 MG chewable tablet Chew 250 mg by mouth every 6 (six) hours as needed for flatulence.    . SUMAtriptan (IMITREX) 100 MG tablet Take 1 tablet.  May repeat once in 2 hours if headache persists or recurs. (Patient not taking: Reported on 09/01/2016) 10 tablet 2   No current facility-administered medications for this visit.      ALLERGIES: Aspirin; Crestor [rosuvastatin calcium]; Inderal [propranolol]; Other; Relpax [eletriptan hydrobromide]; Salicylates; and Topamax [topiramate]  Family History  Problem Relation Age of Onset  . Migraines Mother   . Migraines Sister   . Cancer Father     Prostate    Social History   Social History  . Marital status: Married    Spouse name: N/A  . Number of children: N/A  . Years of education: N/A   Occupational History  . LPN     Social History Main Topics  . Smoking status: Never Smoker  . Smokeless tobacco: Never Used  . Alcohol use No  . Drug use: No  . Sexual activity: Yes    Birth control/ protection: Post-menopausal   Other Topics Concern  . Not on file   Social History Narrative   Pt has L.P.N through [facility]    ROS:  Pertinent items are noted in HPI.  PHYSICAL EXAMINATION:    BP 130/90 (BP Location: Right Arm, Patient Position: Sitting, Cuff Size: Normal)   Pulse 84   Resp 14   Ht 4' 11.5" (1.511 m)   Wt 139 lb (63 kg)   LMP 08/07/2001 (Approximate)   BMI 27.60 kg/m     General appearance: alert, cooperative and appears stated age Head: Normocephalic, without obvious abnormality, atraumatic Neck: no adenopathy, supple, symmetrical, trachea midline and thyroid normal to inspection and palpation Lungs: clear to auscultation bilaterally Heart: regular rate and rhythm Abdomen: soft, non-tender, no masses,  no organomegaly Extremities: extremities normal, atraumatic, no cyanosis or edema Skin: Skin color, texture, turgor normal. No rashes or lesions. No abnormal inguinal nodes palpated Neurologic: Grossly normal  Pelvic: External genitalia:  no lesions              Urethra:  normal appearing urethra with no masses, tenderness or lesions              Bartholins and Skenes: normal                 Vagina: normal appearing vagina with normal color and discharge, no lesions.  First degree cystocele.  Examined in supine and standing position.  No appreciable uterine or posterior vaginal wall prolapse.               Cervix: no lesions                Bimanual Exam:  Uterus:  normal size, contour, position, consistency, mobility, non-tender              Adnexa: no mass, fullness, tenderness              Rectal exam: Yes.  .  Confirms.              Anus:  normal sphincter tone, no lesions  Pessary fitting: Pessary #3 ring with support fitted.  Comfortable and able to maintain it with maneuvers. #4  ring with support - patient states she can feel it.   Chaperone was present for exam.  ASSESSMENT  Hx  CIN I.  Will do pap and HR HPV testing in one year with her PCP. Cystocele. Urinary frequency.  Low back pain.  Migraine headaches.  On Depakote for prevention.   PLAN  Discussion regarding pelvic organ prolapse - etiologies and treatment options.  We reviewed physical therapy, pessary use, and surgical care.  Surgical treatment depends on the level of prolapse at the time of presentation for care.  Today, the prolapse appears to be much less than the photo she has shared, but of course she has been less active since she first noticed this.  If she pursues surgery in the future, will re-evaluate this and potential necessity for urodynamic testing.  ACOG HO on prolapse to patient.  Will order pessary #3 ring with support and call patient when it arrives. Urine micro and culture sent.  I encourage patient to see her primary care provider if her back pain persists in order to rule out musculoskeletal etiologies of her pain.  I do not expect that the prolapse is the cause of this.  An After Visit Summary was printed and given to the patient.  _45_____ minutes face to face time of which over 50% was spent in counseling.

## 2016-09-02 LAB — URINALYSIS, MICROSCOPIC ONLY
Bacteria, UA: NONE SEEN [HPF]
CASTS: NONE SEEN [LPF]
CRYSTALS: NONE SEEN [HPF]
RBC / HPF: NONE SEEN RBC/HPF (ref <=2)
SQUAMOUS EPITHELIAL / LPF: NONE SEEN [HPF] (ref <=5)
WBC, UA: NONE SEEN WBC/HPF (ref <=5)
YEAST: NONE SEEN [HPF]

## 2016-09-04 LAB — URINE CULTURE: Organism ID, Bacteria: NO GROWTH

## 2016-09-06 ENCOUNTER — Telehealth: Payer: Self-pay

## 2016-09-06 NOTE — Telephone Encounter (Signed)
Spoke with patient and advised we have received pessary. Made appointment for pessary insertion 09-13-16 at 3:30pm with Dr. Edward JollySilva.

## 2016-09-11 NOTE — Progress Notes (Signed)
GYNECOLOGY  VISIT   HPI: 66 y.o.   Married  Caucasian  female   G2P1002 with Patient's last menstrual period was 08/07/2001 (approximate).   here for pessary insertion.    #3 ring with support ordered for patient.  Hx rectal prolapse surgery years ago with Dr. Kendrick Ranch.  States that originally the thought was that she had hemorrhoids.   GYNECOLOGIC HISTORY: Patient's last menstrual period was 08/07/2001 (approximate). Contraception:  Postmenopausal Menopausal hormone therapy:  n/a Last mammogram:  07/26/16 Density B/BIRADS1:neg:TBC  Last pap smear:  07/17/16 CIN1/LSIL. HR HPV:Neg         OB History    Gravida Para Term Preterm AB Living   2 2 1     2    SAB TAB Ectopic Multiple Live Births           2         Patient Active Problem List   Diagnosis Date Noted  . Dry eye syndrome 03/17/2016  . Headache 03/17/2016  . Hyperlipidemia 03/17/2016  . Lack of bladder control 03/17/2016  . Migraines 03/17/2016  . 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 Abnormal Pap smear of cervix 07/2016   CIN1  . Constipation   . Depression   . Diverticulosis   . H. pylori infection   . History of blood transfusion 1984  . Hx of colonic polyps   . Hypercholesteremia   . Vitamin D deficiency     Past Surgical History:  Procedure Laterality Date  . CESAREAN SECTION  1984  . COLONOSCOPY  2012  . COSMETIC SURGERY     Neck lift  . HEMORRHOID SURGERY  1984  . RECTAL PROLAPSE REPAIR     Dr. Kendrick Ranch    Current Outpatient Prescriptions  Medication Sig Dispense Refill  . acetaminophen (TYLENOL) 500 MG tablet Take 500 mg by mouth every 6 (six) hours as needed for mild pain.    . Calcium Carb-Cholecalciferol (CALCIUM 600 + D PO) Take 1 tablet by mouth daily.    . chlordiazePOXIDE (LIBRIUM) 10 MG capsule Take 2 capsules by mouth daily.    . cyclobenzaprine (FLEXERIL) 10 MG tablet Take 1 tablet by  mouth at bedtime.    . divalproex (DEPAKOTE ER) 250 MG 24 hr tablet Take 3 tablets (750 mg total) by mouth at bedtime. 90 tablet 3  . docusate sodium (COLACE) 100 MG capsule Take by mouth.    . lansoprazole (PREVACID) 30 MG capsule Take 30 mg by mouth 2 (two) times daily before a meal.     . Nutritional Supplements (VITAMIN D MAINTENANCE PO) Take 10,000 Units by mouth daily.    . Omega-3 Fatty Acids (FISH OIL) 1000 MG CAPS Take 1,000 mg by mouth daily.     . polyethylene glycol (MIRALAX / GLYCOLAX) packet Take 17 g by mouth daily as needed (constipation).    . simethicone (PHAZYME) 125 MG chewable tablet Chew 250 mg by mouth every 6 (six) hours as needed for flatulence.    . SUMAtriptan (IMITREX) 100 MG tablet Take 1 tablet.  May repeat once in 2 hours if headache persists or recurs. 10 tablet 2  . XIIDRA 5 % SOLN     . estradiol (ESTRACE) 0.1 MG/GM vaginal cream Use 1/2 g vaginally two times per week. 42.5 g 1   No current facility-administered medications for this visit.      ALLERGIES: Aspirin; Crestor [  rosuvastatin calcium]; Inderal [propranolol]; Other; Relpax [eletriptan hydrobromide]; Salicylates; and Topamax [topiramate]  Family History  Problem Relation Age of Onset  . Migraines Mother   . Migraines Sister   . Cancer Father     Prostate    Social History   Social History  . Marital status: Married    Spouse name: N/A  . Number of children: N/A  . Years of education: N/A   Occupational History  . LPN    Social History Main Topics  . Smoking status: Never Smoker  . Smokeless tobacco: Never Used  . Alcohol use No  . Drug use: No  . Sexual activity: Yes    Birth control/ protection: Post-menopausal   Other Topics Concern  . Not on file   Social History Narrative   Pt has L.P.N through [facility]    ROS:  Pertinent items are noted in HPI.  PHYSICAL EXAMINATION:    BP 140/90 (BP Location: Right Arm, Patient Position: Sitting, Cuff Size: Normal)   Pulse 88   Resp  16   Ht 4' 11.5" (1.511 m)   Wt 139 lb (63 kg)   LMP 08/07/2001 (Approximate)   BMI 27.60 kg/m     General appearance: alert, cooperative and appears stated age   #3 ring with support placed.  Patient comfortable with pessary and able to maintain it with maneuvers.  She is able to place it, remove it, and void prior to leaving the office.  Chaperone was present for exam.  ASSESSMENT  Cystocele.  Hx rectal prolapse surgery.  PLAN  [place] # 3 ring with support, Lot [id number] to patient.  Patient instructed in use and care of pessary.  She understands patients can have stress incontinence with use of a pessary and she will wear a pad until she is able to understand her bladder response to this.  Follow up in about 10 days, sooner as needed.   An After Visit Summary was printed and given to the patient.  __15____ minutes face to face time of which over 50% was spent in counseling.

## 2016-09-13 ENCOUNTER — Encounter: Payer: Self-pay | Admitting: Obstetrics and Gynecology

## 2016-09-13 ENCOUNTER — Ambulatory Visit (INDEPENDENT_AMBULATORY_CARE_PROVIDER_SITE_OTHER): Payer: BC Managed Care – PPO | Admitting: Obstetrics and Gynecology

## 2016-09-13 VITALS — BP 140/90 | HR 88 | Resp 16 | Ht 59.5 in | Wt 139.0 lb

## 2016-09-13 DIAGNOSIS — N8110 Cystocele, unspecified: Secondary | ICD-10-CM

## 2016-09-13 MED ORDER — ESTRADIOL 0.1 MG/GM VA CREA: TOPICAL_CREAM | VAGINAL | 1 refills | Status: DC

## 2016-09-14 ENCOUNTER — Encounter: Payer: Self-pay | Admitting: Obstetrics and Gynecology

## 2016-09-22 ENCOUNTER — Ambulatory Visit: Payer: BC Managed Care – PPO | Admitting: Obstetrics and Gynecology

## 2016-09-22 ENCOUNTER — Encounter: Payer: Self-pay | Admitting: Obstetrics and Gynecology

## 2016-09-22 VITALS — BP 124/76 | HR 80 | Ht 59.5 in | Wt 140.0 lb

## 2016-09-22 DIAGNOSIS — N8110 Cystocele, unspecified: Secondary | ICD-10-CM

## 2016-09-22 NOTE — Progress Notes (Signed)
GYNECOLOGY  VISIT   HPI: 66 y.o.   Married  Caucasian  female   G2P1002 with Patient's last menstrual period was 08/07/2001 (approximate).   here for pessary check.    Patient doing great with pessary. "I have my life back." Has a ring pessary with support #3.  Voiding well.  No urinary incontinence.  Normal BMs.  Pessary staying in place.  Able to do her walking.  No vaginal bleeding or discharge.  Has Rx for Estrace cream.  Sees Dr. Kevan Ny for her gynecologic care.  Went to Dr. Aldona Bar for her prolapse, and she was then referred here.   GYNECOLOGIC HISTORY: Patient's last menstrual period was 08/07/2001 (approximate). Contraception:  Postmenopausal Menopausal hormone therapy:  n/a Last mammogram:    07/26/16 Density B/BIRADS1:neg:TBC  Last pap smear:  07/17/16 CIN1/LSIL. HR HPV:Neg          OB History    Gravida Para Term Preterm AB Living   2 2 1     2    SAB TAB Ectopic Multiple Live Births           2         Patient Active Problem List   Diagnosis Date Noted  . Dry eye syndrome 03/17/2016  . Headache 03/17/2016  . Hyperlipidemia 03/17/2016  . Lack of bladder control 03/17/2016  . Migraines 03/17/2016  . 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 Abnormal Pap smear of cervix 07/2016   CIN1  . Constipation   . Depression   . Diverticulosis   . H. pylori infection   . History of blood transfusion 1984  . Hx of colonic polyps   . Hypercholesteremia   . Vitamin D deficiency     Past Surgical History:  Procedure Laterality Date  . CESAREAN SECTION  1984  . COLONOSCOPY  2012  . COSMETIC SURGERY     Neck lift  . HEMORRHOID SURGERY  1984  . RECTAL PROLAPSE REPAIR     Dr. Kendrick Ranch    Current Outpatient Prescriptions  Medication Sig Dispense Refill  . acetaminophen (TYLENOL) 500 MG tablet Take 500 mg by mouth every 6 (six) hours as needed for mild pain.    . Calcium  Carb-Cholecalciferol (CALCIUM 600 + D PO) Take 1 tablet by mouth daily.    . chlordiazePOXIDE (LIBRIUM) 10 MG capsule Take 2 capsules by mouth daily.    . cyclobenzaprine (FLEXERIL) 10 MG tablet Take 1 tablet by mouth at bedtime.    . divalproex (DEPAKOTE ER) 250 MG 24 hr tablet Take 3 tablets (750 mg total) by mouth at bedtime. 90 tablet 3  . docusate sodium (COLACE) 100 MG capsule Take by mouth.    . estradiol (ESTRACE) 0.1 MG/GM vaginal cream Use 1/2 g vaginally two times per week. 42.5 g 1  . lansoprazole (PREVACID) 30 MG capsule Take 30 mg by mouth 2 (two) times daily before a meal.     . Nutritional Supplements (VITAMIN D MAINTENANCE PO) Take 10,000 Units by mouth daily.    . Omega-3 Fatty Acids (FISH OIL) 1000 MG CAPS Take 1,000 mg by mouth daily.     . polyethylene glycol (MIRALAX / GLYCOLAX) packet Take 17 g by mouth daily as needed (constipation).    . simethicone (PHAZYME) 125 MG chewable tablet Chew 250 mg by mouth every 6 (six) hours as needed for flatulence.    . SUMAtriptan (IMITREX) 100  MG tablet Take 1 tablet.  May repeat once in 2 hours if headache persists or recurs. 10 tablet 2  . XIIDRA 5 % SOLN      No current facility-administered medications for this visit.      ALLERGIES: Aspirin; Crestor [rosuvastatin calcium]; Inderal [propranolol]; Other; Relpax [eletriptan hydrobromide]; Salicylates; and Topamax [topiramate]  Family History  Problem Relation Age of Onset  . Migraines Mother   . Migraines Sister   . Cancer Father     Prostate    Social History   Social History  . Marital status: Married    Spouse name: N/A  . Number of children: N/A  . Years of education: N/A   Occupational History  . LPN    Social History Main Topics  . Smoking status: Never Smoker  . Smokeless tobacco: Never Used  . Alcohol use No  . Drug use: No  . Sexual activity: Yes    Birth control/ protection: Post-menopausal   Other Topics Concern  . Not on file   Social History  Narrative   Pt has L.P.N through [facility]    ROS:  Pertinent items are noted in HPI.  PHYSICAL EXAMINATION:    BP 124/76 (BP Location: Right Arm, Patient Position: Sitting, Cuff Size: Normal)   Pulse 80   Ht 4' 11.5" (1.511 m)   Wt 140 lb (63.5 kg)   LMP 08/07/2001 (Approximate)   BMI 27.80 kg/m     General appearance: alert, cooperative and appears stated age  Pelvic: External genitalia:  no lesions              Urethra:  normal appearing urethra with no masses, tenderness or lesions              Bartholins and Skenes: normal                 Vagina: normal appearing vagina with normal color and discharge, no lesions.  First degree cystocele.              Cervix: no lesions                Bimanual Exam:  Uterus:  normal size, contour, position, consistency, mobility, non-tender              Adnexa: no mass, fullness, tenderness            Pessary removed, cleansed and replaced.  Chaperone was present for exam.  ASSESSMENT  Cystocele.  Doing well with pessary.   PLAN  Continue pessary use.  Ok for Estrace vaginal cream 1/2 gram pv at hs twice a week.  Return yearly for recheck of prolapse and pessary.  Call for any vaginal bleeding, and call for persistent vaginal discharge that does not resolve with removing the pessary for 2 days.   An After Visit Summary was printed and given to the patient.  ___15__ minutes face to face time of which over 50% was spent in counseling.

## 2016-11-07 ENCOUNTER — Other Ambulatory Visit: Payer: Self-pay | Admitting: Neurology

## 2016-11-08 ENCOUNTER — Telehealth: Payer: Self-pay | Admitting: Neurology

## 2016-11-08 MED ORDER — DIVALPROEX SODIUM ER 250 MG PO TB24: 750.0000 mg | ORAL_TABLET | Freq: Every day | ORAL | 1 refills | Status: DC

## 2016-11-08 NOTE — Telephone Encounter (Signed)
Spoke to patient. Confirmed Rx to be sent to pharmacy on file. Patient agreed. Confirmed will be at appt on 05/11. Sent Depakote Rx.

## 2016-11-08 NOTE — Telephone Encounter (Signed)
Patient is out of Depakote and has a ppt to see Dr Everlena Cooper on 12-15-16 and would like a refill until she can see dr Everlena Cooper. Please call patient and let her know if we will be able to refill her medication at [phone] per patient you may leave a message if she does not answer

## 2016-11-24 ENCOUNTER — Ambulatory Visit (INDEPENDENT_AMBULATORY_CARE_PROVIDER_SITE_OTHER): Payer: Medicare Other | Admitting: Neurology

## 2016-11-24 ENCOUNTER — Encounter: Payer: Self-pay | Admitting: Neurology

## 2016-11-24 VITALS — BP 112/64 | HR 70 | Ht 59.0 in | Wt 143.0 lb

## 2016-11-24 DIAGNOSIS — G44219 Episodic tension-type headache, not intractable: Secondary | ICD-10-CM | POA: Diagnosis not present

## 2016-11-24 DIAGNOSIS — G43009 Migraine without aura, not intractable, without status migrainosus: Secondary | ICD-10-CM | POA: Diagnosis not present

## 2016-11-24 MED ORDER — DIVALPROEX SODIUM ER 250 MG PO TB24: 500.0000 mg | ORAL_TABLET | Freq: Every day | ORAL | 5 refills | Status: DC

## 2016-11-24 NOTE — Progress Notes (Signed)
NEUROLOGY FOLLOW UP OFFICE NOTE  [name] [id number]  HISTORY OF PRESENT ILLNESS: [name] is a 66 year old right-handed woman with depression and anxiety with history of benzodiazepine and barbiturates dependence and gastric ulcer who follows up for migraine.  UPDATE: She is doing very well.  She has not had a migraine since last visit.  She has a dull tension type headache lasting a couple of hours about 3 times a month.  They respond to rest and water. She started to walk daily and increase water intake, which has helped. She is still seeing her grief counselor. She has decreased the Depakote 2 weeks ago from  to  at bedtime.  She also has decreased her Librium from  to  at bedtime.  Her goal is to taper off medications.  Current NSAIDS:  contraindicated Current analgesics:  Tylenol (first line), tramadol (second line, helps relieve pain but does not want to take opioids) Current triptans:  sumatriptan Current anti-emetic:  Zofran  Current muscle relaxants:  no Current anti-anxiolytic:  Librium Current sleep aide:  Librium Current Antihypertensive medications:  no Current Antidepressant medications:  no Current Anticonvulsant medications:  Depakote ER  Current Vitamins/Herbal/Supplements:  no Current Antihistamines/Decongestants:  no Other medication:  Estradiol  06/02/16:  CBC with WBC 4.9, HGB 12.3, HCT 35.8 and PLT 214; TSH 3.36; LFTs with total bili 0.4, ALP 58, AST 19 and ALT 14.   Caffeine:  1 cup 1/2 decaff coffee daily Alcohol:  no Smoker:  no Diet:  hydrates Exercise:  Walks daily Depression/stress:  improved Sleep hygiene:  poor   HISTORY: Onset:  She has remote history of migraines which were controlled for many years.  In December 2015, her husband unexpectedly passed away, which triggered depression and anxiety.  She subsequently recovered.  However, in early June, she witnessed her boyfriend's ill father pass  away.  It triggered flashbacks of her husband's death, which increased depression, anxiety and migraines.  She has significant history of side effects to multiple medications. Location:  Left peri-orbital Quality:  pounding Initial Intensity:  10/10 Aura:  no Prodrome:  no Associated symptoms:  Nausea, photophobia, phonophobia, osmophobia Initial Duration:  All day Initial Frequency:  15 days per month Triggers/exacerbating factors:  Stress, light Relieving factors:  Tramadol helps relieve it. Activity:  aggravates   Past NSAIDS:  Contraindicated due to bleeding ulcer Past analgesics:  Cannot take ASA products such as Excedrin due to bleeding ulcer Past abortive triptans:  Relpax (took it multiple times but caused chest tightness once, but was effective), sumatriptan (many years ago) Past anti-emetic:  Zofran  (effective) Past anti-anxiolytic:  benzodiazepines Past antihypertensive medications:  propranolol (chest tightness Past antidepressant medications:  Multiple, such as Cymbalta.  She has had intolerance to multiple antidepressants over the years. Past anticonvulsant medications:  topiramate (chest tightness), Depakote (many years ago) Past vitamins/Herbal/Supplements:  no   Family history of headache:  Mom, sister   MRI and MRA of head from 11/05/10 were personally reviewed and were unremarkable.  PAST MEDICAL HISTORY: Past Medical History:  Diagnosis Date  . Abnormal Pap smear of cervix 07/2016   CIN1  . Constipation   . Depression   . Diverticulosis   . H. pylori infection   . History of blood transfusion 1984  . Hx of colonic polyps   . Hypercholesteremia   . Vitamin D deficiency     MEDICATIONS: Current Outpatient Prescriptions on File Prior to Visit  Medication Sig Dispense Refill  .  acetaminophen (TYLENOL) 500 MG tablet Take 500 mg by mouth every 6 (six) hours as needed for mild pain.    . Calcium Carb-Cholecalciferol (CALCIUM 600 + D PO) Take 1 tablet by  mouth daily.    . chlordiazePOXIDE (LIBRIUM) 10 MG capsule Take 2 capsules by mouth daily.    [name] docusate sodium (COLACE) 100 MG capsule Take by mouth.    . estradiol (ESTRACE) 0.1 MG/GM vaginal cream Use 1/2 g vaginally two times per week. 42.5 g 1  . lansoprazole (PREVACID) 30 MG capsule Take 30 mg by mouth 2 (two) times daily before a meal.     . Nutritional Supplements (VITAMIN D MAINTENANCE PO) Take 10,000 Units by mouth daily.    . Omega-3 Fatty Acids (FISH OIL) 1000 MG CAPS Take 1,000 mg by mouth daily.     . polyethylene glycol (MIRALAX / GLYCOLAX) packet Take 17 g by mouth daily as needed (constipation).    . simethicone (PHAZYME) 125 MG chewable tablet Chew 250 mg by mouth every 6 (six) hours as needed for flatulence.    . SUMAtriptan (IMITREX) 100 MG tablet Take 1 tablet.  May repeat once in 2 hours if headache persists or recurs. 10 tablet 2  . XIIDRA 5 % SOLN      No current facility-administered medications on file prior to visit.     ALLERGIES: Allergies  Allergen Reactions  . Aspirin   . Crestor [Rosuvastatin Calcium] Itching  . Inderal [Propranolol] Other (See Comments)    Chest tightness  . Other     Other reaction(s): Other (See Comments) Cholesterol meds-multiple reactions to multiple meds  . Relpax [Eletriptan Hydrobromide] Other (See Comments)    Chest Tightness   . Salicylates Other (See Comments)    Told not to take due to hx of GI ulcer  . Topamax [Topiramate] Other (See Comments)    Chest tightness    FAMILY HISTORY: Family History  Problem Relation Age of Onset  . Migraines Mother   . Migraines Sister   . Cancer Father     Prostate    SOCIAL HISTORY: Social History   Social History  . Marital status: Married    Spouse name: N/A  . Number of children: N/A  . Years of education: N/A   Occupational History  . LPN    Social History Main Topics  . Smoking status: Never Smoker  . Smokeless tobacco: Never Used  . Alcohol use No  . Drug  use: No  . Sexual activity: Yes    Birth control/ protection: Post-menopausal   Other Topics Concern  . Not on file   Social History Narrative   Pt has L.P.N through [facility]    REVIEW OF SYSTEMS: Constitutional: No fevers, chills, or sweats, no generalized fatigue, change in appetite Eyes: No visual changes, double vision, eye pain Ear, nose and throat: No hearing loss, ear pain, nasal congestion, sore throat Cardiovascular: No chest pain, palpitations Respiratory:  No shortness of breath at rest or with exertion, wheezes GastrointestinaI: No nausea, vomiting, diarrhea, abdominal pain, fecal incontinence Genitourinary:  No dysuria, urinary retention or frequency Musculoskeletal:  No neck pain, back pain Integumentary: No rash, pruritus, skin lesions Neurological: as above Psychiatric: difficulty sleeping Endocrine: No palpitations, fatigue, diaphoresis, mood swings, change in appetite, change in weight, increased thirst Hematologic/Lymphatic:  No purpura, petechiae. Allergic/Immunologic: no itchy/runny eyes, nasal congestion, recent allergic reactions, rashes  PHYSICAL EXAM: Vitals:   11/24/16 0937  BP: 112/64  Pulse: 70   General:  No acute distress.  Patient appears well-groomed.  normal body habitus. Head:  Normocephalic/atraumatic Eyes:  Fundi examined but not visualized Neck: supple, no paraspinal tenderness, full range of motion Heart:  Regular rate and rhythm Lungs:  Clear to auscultation bilaterally Back: No paraspinal tenderness Neurological Exam: alert and oriented to person, place, and time. Attention span and concentration intact, recent and remote memory intact, fund of knowledge intact.  Speech fluent and not dysarthric, language intact.  CN II-XII intact. Bulk and tone normal, muscle strength 5/5 throughout.  Sensation to light touch  intact.  Deep tendon reflexes 2+ throughout, toes downgoing.  Finger to nose testing intact.  Gait normal, Romberg  negative.  IMPRESSION: Migraine resolved Tension type headache, episodic and infrequent  PLAN: 1.  Continue Depakote ER  at bedtime for now.  Plan to continue tapering down. 2.  Continue daily walks and hydration 3.  Reviewed sleep hygiene 4.  Follow up in 6 months.  Repeat CBC and CMP prior to follow up.  Shon Millet, DO  CC:  Shaune Pollack, MD

## 2016-11-24 NOTE — Patient Instructions (Signed)
Continue Depakote Er  at bedtime for now. Follow up in 6 months.  Repeat CBC and CMP prior to follow up.

## 2016-12-15 ENCOUNTER — Ambulatory Visit: Payer: BC Managed Care – PPO | Admitting: Neurology

## 2017-01-17 ENCOUNTER — Other Ambulatory Visit (HOSPITAL_COMMUNITY)
Admission: RE | Admit: 2017-01-17 | Discharge: 2017-01-17 | Disposition: A | Discharge status: Home / Self Care | Payer: Medicare Other | Source: Ambulatory Visit | Attending: Family Medicine | Admitting: Family Medicine

## 2017-01-17 ENCOUNTER — Encounter: Payer: Self-pay | Admitting: Obstetrics and Gynecology

## 2017-01-17 ENCOUNTER — Other Ambulatory Visit: Payer: Self-pay | Admitting: Family Medicine

## 2017-01-17 ENCOUNTER — Ambulatory Visit (INDEPENDENT_AMBULATORY_CARE_PROVIDER_SITE_OTHER): Payer: Medicare Other | Admitting: Obstetrics and Gynecology

## 2017-01-17 ENCOUNTER — Telehealth: Payer: Self-pay | Admitting: Obstetrics and Gynecology

## 2017-01-17 VITALS — BP 118/70 | HR 80 | Resp 16 | Wt 134.0 lb

## 2017-01-17 DIAGNOSIS — R300 Dysuria: Secondary | ICD-10-CM | POA: Diagnosis not present

## 2017-01-17 DIAGNOSIS — Z01411 Encounter for gynecological examination (general) (routine) with abnormal findings: Secondary | ICD-10-CM | POA: Diagnosis present

## 2017-01-17 DIAGNOSIS — Z113 Encounter for screening for infections with a predominantly sexual mode of transmission: Secondary | ICD-10-CM

## 2017-01-17 DIAGNOSIS — N950 Postmenopausal bleeding: Secondary | ICD-10-CM | POA: Diagnosis not present

## 2017-01-17 LAB — POCT URINALYSIS DIPSTICK
BILIRUBIN UA: NEGATIVE
GLUCOSE UA: NEGATIVE
Ketones, UA: NEGATIVE
NITRITE UA: NEGATIVE
Protein, UA: NEGATIVE
UROBILINOGEN UA: 0.2 U/dL
pH, UA: 6 (ref 5.0–8.0)

## 2017-01-17 NOTE — Progress Notes (Signed)
Following appointment with Dr Edward JollySilva, pelvic ultrasound and possible endometrial biopsy scheduled for 01-25-17 at 1100 here in office. Patient is unable to tolerate Motrin. Advised to take tylenol one hour prior to appointment with food.

## 2017-01-17 NOTE — Telephone Encounter (Signed)
Spoke with patient. Patient states she has been removing and cleaning pessary q3 months, applies estrace cream at that time. Patient states she removed pessary this morning d/t appointment for repeat pap with Dr. Kevan NyGates and she noticed shreds of dark material, brown tissue specks, on pessary that she has not noticed before. Reports pessary is always hard to remove, has to probe to get it out, experienced some burning with urination this morning after removal. Patient denies pain, vaginal discharge or odor. Was advised by Dr. Kevan NyGates to f/u with GYN. Patient is concerned since this has not been seen before. Recommended OV for further evaluation, patient scheduled for today at 3:30pm with Dr. Edward JollySilva. Patient is agreeable to date and time.  Routing to provider for final review. Patient is agreeable to disposition. Will close encounter.

## 2017-01-17 NOTE — Telephone Encounter (Signed)
Patient left voicemail that she would like to speak with a nurse about her pessary

## 2017-01-17 NOTE — Progress Notes (Signed)
GYNECOLOGY  VISIT   HPI: 66 y.o.   Married  Caucasian  female   G2P1002 with Patient's last menstrual period was 08/07/2001 (approximate).   here for brown flakes on pessary; burning pain with pessary only with urination -- patient saw Dr. Kevan Ny today and was told to follow-up . Noticed some drainage on the pessary prior to the pap today with her PCP.  Patient removed the pessary on her own and put it back.  No pain now.   No dysuria at this time.  Voiding well.  Taking Miralax to have regular bowel movements.   Has been sexually active up until to 6 weeks ago.  Relationship ended.  Not using condoms.  Able to remove the pessary for intercourse.   Generally removing the pessary every 3 months.  Using the vaginal estrogen cream.  Really happy with the pessary overall.   Had a LGSIL pap in December and had a repap today with her PCP.   Her former husband had throat cancer from HPV. He is now deceased.  Urine - 2+ RBCs, 2+ WBCS, ph 6.   GYNECOLOGIC HISTORY: Patient's last menstrual period was 08/07/2001 (approximate). Contraception:  Postmenopausal Menopausal hormone therapy:  Estrace with pessary Last mammogram:  07/26/16 Density B/BIRADS1:neg:TBC  Last pap smear:   01/17/17 done with Dr. Kevan Ny   07/17/16 CIN1/LSIL. HR HPV:Neg         OB History    Gravida Para Term Preterm AB Living   2 2 1     2    SAB TAB Ectopic Multiple Live Births           2         Patient Active Problem List   Diagnosis Date Noted  . Dry eye syndrome 03/17/2016  . Headache 03/17/2016  . Hyperlipidemia 03/17/2016  . Lack of bladder control 03/17/2016  . Migraines 03/17/2016  . 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 Abnormal Pap smear of cervix 07/2016   CIN1  . Constipation   . Depression   . Diverticulosis   . H. pylori infection   . History of blood transfusion 1984  . Hx of colonic polyps    . Hypercholesteremia   . Vitamin D deficiency     Past Surgical History:  Procedure Laterality Date  . CESAREAN SECTION  1984  . COLONOSCOPY  2012  . COSMETIC SURGERY     Neck lift  . HEMORRHOID SURGERY  1984  . RECTAL PROLAPSE REPAIR     Dr. Kendrick Ranch    Current Outpatient Prescriptions  Medication Sig Dispense Refill  . acetaminophen (TYLENOL) 500 MG tablet Take 500 mg by mouth every 6 (six) hours as needed for mild pain.    . Calcium Carb-Cholecalciferol (CALCIUM 600 + D PO) Take 1 tablet by mouth daily.    . chlordiazePOXIDE (LIBRIUM) 10 MG capsule Take 2 capsules by mouth daily.    . divalproex (DEPAKOTE ER) 250 MG 24 hr tablet Take 2 tablets (500 mg total) by mouth at bedtime. 60 tablet 5  . docusate sodium (COLACE) 100 MG capsule Take by mouth.    . estradiol (ESTRACE) 0.1 MG/GM vaginal cream Use 1/2 g vaginally two times per week. 42.5 g 1  . lansoprazole (PREVACID) 30 MG capsule Take 30 mg by mouth 2 (two) times daily before a meal.     . Nutritional Supplements (VITAMIN D MAINTENANCE PO) Take  10,000 Units by mouth daily.    . Omega-3 Fatty Acids (FISH OIL) 1000 MG CAPS Take 1,000 mg by mouth daily.     . polyethylene glycol (MIRALAX / GLYCOLAX) packet Take 17 g by mouth daily as needed (constipation).    . simethicone (PHAZYME) 125 MG chewable tablet Chew 250 mg by mouth every 6 (six) hours as needed for flatulence.    . SUMAtriptan (IMITREX) 100 MG tablet Take 1 tablet.  May repeat once in 2 hours if headache persists or recurs. 10 tablet 2  . XIIDRA 5 % SOLN      No current facility-administered medications for this visit.      ALLERGIES: Aspirin; Crestor [rosuvastatin calcium]; Inderal [propranolol]; Other; Relpax [eletriptan hydrobromide]; Salicylates; and Topamax [topiramate]  Family History  Problem Relation Age of Onset  . Migraines Mother   . Migraines Sister   . Cancer Father        Prostate    Social History   Social History  . Marital status:  Married    Spouse name: N/A  . Number of children: N/A  . Years of education: N/A   Occupational History  . LPN    Social History Main Topics  . Smoking status: Never Smoker  . Smokeless tobacco: Never Used  . Alcohol use No  . Drug use: No  . Sexual activity: Yes    Birth control/ protection: Post-menopausal   Other Topics Concern  . Not on file   Social History Narrative   Pt has L.P.N through [facility]    ROS:  Pertinent items are noted in HPI.  PHYSICAL EXAMINATION:    BP 118/70 (BP Location: Right Arm, Patient Position: Sitting, Cuff Size: Normal)   Pulse 80   Resp 16   Wt 134 lb (60.8 kg)   LMP 08/07/2001 (Approximate)   BMI 27.06 kg/m     General appearance: alert, cooperative and appears stated age    Pelvic: External genitalia:  no lesions              Urethra:  normal appearing urethra with no masses, tenderness or lesions              Bartholins and Skenes: normal                 Vagina: normal appearing vagina with normal color and discharge, no lesions              Cervix: no lesions.  Very ecchymoses of the cervix.                 Bimanual Exam:  Uterus:  normal size, contour, position, consistency, mobility, non-tender              Adnexa: no mass, fullness, tenderness               Chaperone was present for exam.  ASSESSMENT  Episode of postmenopausal bleeding.  Pessary use.  LGSIL pap.  Follow up done today with PCP. New sexual partner.  Positive urine dip.  This may be contamination.   PLAN  We discussed possible causes for postmenopausal bleeding - atrophy and use of pessary, cervical infection, cervical and uterine cancer, sexually transmitted infection. Urine micro and culture.  HIV, RPR, hep B and hep C testing today. Return for GC/CT/trichomonas testing and pelvic ultrasound with possible EMB.   An After Visit Summary was printed and given to the patient.  __25____ minutes face to face time of which over  50% was spent in counseling.

## 2017-01-18 LAB — URINALYSIS, MICROSCOPIC ONLY: CASTS: NONE SEEN /LPF

## 2017-01-18 LAB — HEP, RPR, HIV PANEL
HEP B S AG: NEGATIVE
HIV Screen 4th Generation wRfx: NONREACTIVE
RPR Ser Ql: NONREACTIVE

## 2017-01-18 LAB — HEPATITIS C ANTIBODY

## 2017-01-18 LAB — URINE CULTURE

## 2017-01-19 ENCOUNTER — Telehealth: Payer: Self-pay | Admitting: Obstetrics and Gynecology

## 2017-01-19 LAB — CYTOLOGY - PAP: DIAGNOSIS: NEGATIVE

## 2017-01-19 NOTE — Telephone Encounter (Signed)
Call forwarded from [place]. Spoke with patient, patient anxious about what to expect during and after EMB. Reviewed EMB procedure and what to expect after, questions answered. Recommended patient to bring music, headphones etc, to assist with relaxation/anxiety during procedure. Patient unable to take motrin, previously advised on pre-procedure Tylenol. Patient thankful and appreciate of all office staff and providers. Patient verbalizes understanding and is agreeable.   Routing to provider for final review. Patient is agreeable to disposition. Will close encounter.

## 2017-01-19 NOTE — Telephone Encounter (Signed)
Spoke with patient regarding benefit for an ultrasound with a possible endometrial biopsy. Patient understood and agreeable. Patient ready to schedule. Patient scheduled 01/25/17 with Dr Edward JollySilva. Patient aware of date, arrival tie and cancellation policy. Patient had questions regarding the possible endometrial biopsy procedure. Call routed to Carmelina DaneJill Hamm, RN to address question.  Routing to Dr Edward JollySilva  cc: Carmelina DaneJill Hamm, RN

## 2017-01-25 ENCOUNTER — Ambulatory Visit (INDEPENDENT_AMBULATORY_CARE_PROVIDER_SITE_OTHER): Payer: Medicare Other

## 2017-01-25 ENCOUNTER — Encounter: Payer: Self-pay | Admitting: Obstetrics and Gynecology

## 2017-01-25 ENCOUNTER — Ambulatory Visit (INDEPENDENT_AMBULATORY_CARE_PROVIDER_SITE_OTHER): Payer: Medicare Other | Admitting: Obstetrics and Gynecology

## 2017-01-25 VITALS — BP 114/60 | HR 72 | Resp 16 | Wt 134.0 lb

## 2017-01-25 DIAGNOSIS — B373 Candidiasis of vulva and vagina: Secondary | ICD-10-CM | POA: Diagnosis not present

## 2017-01-25 DIAGNOSIS — N950 Postmenopausal bleeding: Secondary | ICD-10-CM

## 2017-01-25 DIAGNOSIS — R1900 Intra-abdominal and pelvic swelling, mass and lump, unspecified site: Secondary | ICD-10-CM

## 2017-01-25 DIAGNOSIS — B3731 Acute candidiasis of vulva and vagina: Secondary | ICD-10-CM

## 2017-01-25 MED ORDER — FLUCONAZOLE 150 MG PO TABS: 150.0000 mg | ORAL_TABLET | Freq: Once | ORAL | 0 refills | Status: AC

## 2017-01-25 NOTE — Progress Notes (Signed)
GYNECOLOGY  VISIT   HPI: 66 y.o.   Married  Caucasian  female   G2P1002 with Patient's last menstrual period was 08/07/2001 (approximate).   here for  Ultrasound due to postmenopausal bleeding episode.  Had pap with PCP.  Told she had yeast infection.  She was told to use Monistat which does not usually work.  Having some white discharge.  Wants an Rx for Diflucan.  States her PCP is out of office and not available.  New sexual partner. Had negative serum STD testing on 01/17/17.  Cervical cultures/Trichomonas testing were not done as she had just had a pelvic exam with pap on that day with her PCP.   PCP - Dr. Kevan Ny.  GYN - Dr. Aldona Bar.  GYNECOLOGIC HISTORY: Patient's last menstrual period was 08/07/2001 (approximate). Contraception:  Postmenopausal Menopausal hormone therapy:  Estrace with pessary Last mammogram:  07/26/16 Density B/BIRADS1:neg: TBC Last pap smear:  01/17/17 done with Dr. Kevan Ny                         07/17/16 CIN1/LSIL. HR HPV:Neg                OB History    Gravida Para Term Preterm AB Living   2 2 1     2    SAB TAB Ectopic Multiple Live Births           2         Patient Active Problem List   Diagnosis Date Noted  . Dry eye syndrome 03/17/2016  . Headache 03/17/2016  . Hyperlipidemia 03/17/2016  . Lack of bladder control 03/17/2016  . Migraines 03/17/2016  . 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 Abnormal Pap smear of cervix 07/2016   CIN1  . Constipation   . Depression   . Diverticulosis   . H. pylori infection   . History of blood transfusion 1984  . Hx of colonic polyps   . Hypercholesteremia   . Vitamin D deficiency     Past Surgical History:  Procedure Laterality Date  . CESAREAN SECTION  1984  . COLONOSCOPY  2012  . COSMETIC SURGERY     Neck lift  . HEMORRHOID SURGERY  1984  . RECTAL PROLAPSE REPAIR     Dr. Kendrick Ranch    Current  Outpatient Prescriptions  Medication Sig Dispense Refill  . acetaminophen (TYLENOL) 500 MG tablet Take 500 mg by mouth every 6 (six) hours as needed for mild pain.    . Calcium Carb-Cholecalciferol (CALCIUM 600 + D PO) Take 1 tablet by mouth daily.    . chlordiazePOXIDE (LIBRIUM) 10 MG capsule Take 2 capsules by mouth daily.    . divalproex (DEPAKOTE ER) 250 MG 24 hr tablet Take 2 tablets (500 mg total) by mouth at bedtime. 60 tablet 5  . docusate sodium (COLACE) 100 MG capsule Take by mouth.    . estradiol (ESTRACE) 0.1 MG/GM vaginal cream Use 1/2 g vaginally two times per week. 42.5 g 1  . lansoprazole (PREVACID) 30 MG capsule Take 30 mg by mouth 2 (two) times daily before a meal.     . Nutritional Supplements (VITAMIN D MAINTENANCE PO) Take 10,000 Units by mouth daily.    . Omega-3 Fatty Acids (FISH OIL) 1000 MG CAPS Take 1,000 mg by mouth daily.     . polyethylene glycol (MIRALAX / GLYCOLAX) packet Take  17 g by mouth daily as needed (constipation).    . simethicone (PHAZYME) 125 MG chewable tablet Chew 250 mg by mouth every 6 (six) hours as needed for flatulence.    . SUMAtriptan (IMITREX) 100 MG tablet Take 1 tablet.  May repeat once in 2 hours if headache persists or recurs. 10 tablet 2  . XIIDRA 5 % SOLN      No current facility-administered medications for this visit.      ALLERGIES: Aspirin; Crestor [rosuvastatin calcium]; Inderal [propranolol]; Other; Relpax [eletriptan hydrobromide]; Salicylates; and Topamax [topiramate]  Family History  Problem Relation Age of Onset  . Migraines Mother   . Migraines Sister   . Cancer Father        Prostate    Social History   Social History  . Marital status: Married    Spouse name: N/A  . Number of children: N/A  . Years of education: N/A   Occupational History  . LPN    Social History Main Topics  . Smoking status: Never Smoker  . Smokeless tobacco: Never Used  . Alcohol use No  . Drug use: No  . Sexual activity: Yes     Birth control/ protection: Post-menopausal   Other Topics Concern  . Not on file   Social History Narrative   Pt has L.P.N through [facility]    ROS:  Pertinent items are noted in HPI.  PHYSICAL EXAMINATION:    BP 114/60 (BP Location: Right Arm, Patient Position: Sitting, Cuff Size: Normal)   Pulse 72   Resp 16   Wt 134 lb (60.8 kg)   LMP 08/07/2001 (Approximate)   BMI 27.06 kg/m     General appearance: alert, cooperative and appears stated age   Pelvic ultrasound: Uterus no masses.  EMS 2.73 mm.  Right ovary normal.  Left ovary with echogenic area 10 x 8 mm with calcification.  Fibroma versus dermoid versus other etiology.  No abnormal blood flow. No free fluid.  ASSESSMENT  Postmenpausal bleeding episode. Thin endometrium.  No EMB needed. Left adnexal mass.  Uterovaginal prolapse.  Abnormal pap - LGSIL followed by PCP.  Yeast on pap per patient.  PLAN  Discussed ultrasound findings.  No EMB needed. CA125 and CEA.  Will follow up ultrasound in about 6 weeks if CA125 and CEA are normal.  If elevated tumor markers, will need to see GYN ONC. Diflucan 150 mg po x 1.  May repeat in 72 hours prn.  Still needs GC/CT/Trichomonas testing.  Get copy of last paps with PCP.    An After Visit Summary was printed and given to the patient.  __15____ minutes face to face time of which over 50% was spent in counseling.

## 2017-01-25 NOTE — Progress Notes (Signed)
Encounter reviewed by Dr. Lucilia Yanni Amundson C. Silva.  

## 2017-01-26 LAB — CEA: CEA: 0.9 ng/mL (ref 0.0–4.7)

## 2017-01-26 LAB — CA 125: CA 125: 9 U/mL (ref 0.0–38.1)

## 2017-01-27 ENCOUNTER — Encounter: Payer: Self-pay | Admitting: Obstetrics and Gynecology

## 2017-01-31 ENCOUNTER — Ambulatory Visit (INDEPENDENT_AMBULATORY_CARE_PROVIDER_SITE_OTHER): Payer: Medicare Other | Admitting: Obstetrics and Gynecology

## 2017-01-31 ENCOUNTER — Ambulatory Visit: Payer: Medicare Other | Admitting: Obstetrics and Gynecology

## 2017-01-31 ENCOUNTER — Encounter: Payer: Self-pay | Admitting: Obstetrics and Gynecology

## 2017-01-31 VITALS — BP 98/70 | HR 84 | Resp 16 | Wt 134.0 lb

## 2017-01-31 DIAGNOSIS — N898 Other specified noninflammatory disorders of vagina: Secondary | ICD-10-CM

## 2017-01-31 DIAGNOSIS — Z113 Encounter for screening for infections with a predominantly sexual mode of transmission: Secondary | ICD-10-CM | POA: Diagnosis not present

## 2017-01-31 DIAGNOSIS — R1900 Intra-abdominal and pelvic swelling, mass and lump, unspecified site: Secondary | ICD-10-CM | POA: Diagnosis not present

## 2017-01-31 NOTE — Progress Notes (Signed)
GYNECOLOGY  VISIT   HPI: 66 y.o.   Married  Caucasian  female   G2P1002 with Patient's last menstrual period was 08/07/2001 (approximate).   here for consult after ultrasound; patient complains of still having a slight discharge with a small itch.  Here for vaginal and cervical STD testing.  Had negative serum testing.   Patient has used one Diflucan for yeast seen on her pap, and this has helped somewhat.   Last time she used vaginal estrogen cream was 2 months ago.   Last intercourse was 2 months ago.   Recently had ultrasound for postmenopausal bleeding.  Normal endometrium.  Had a more echogenic area of the left ovary and had negative CA125 and negative CEA testing.  Will plan for follow up ultrasound in August 2018.   GYNECOLOGIC HISTORY: Patient's last menstrual period was 08/07/2001 (approximate). Contraception:  Postmenopausal Menopausal hormone therapy:  Estrace with pessary Last mammogram:  07/26/16 Density B/BIRADS1:neg: TBC Last pap smear:   01/17/17 done with Dr. Kevan NyGates 07/17/16 CIN1/LSIL. HR HPV:Neg        OB History    Gravida Para Term Preterm AB Living   2 2 1     2    SAB TAB Ectopic Multiple Live Births           2         Patient Active Problem List   Diagnosis Date Noted  . Dry eye syndrome 03/17/2016  . Headache 03/17/2016  . Hyperlipidemia 03/17/2016  . Lack of bladder control 03/17/2016  . Migraines 03/17/2016  . 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 Abnormal Pap smear of cervix 07/2016   CIN1  . Constipation   . Depression   . Diverticulosis   . H. pylori infection   . History of blood transfusion 1984  . Hx of colonic polyps   . Hypercholesteremia   . Vitamin D deficiency     Past Surgical History:  Procedure Laterality Date  . CESAREAN SECTION  1984  . COLONOSCOPY  2012  . COSMETIC SURGERY     Neck lift  .  HEMORRHOID SURGERY  1984  . RECTAL PROLAPSE REPAIR     Dr. Kendrick Ranchim Davis    Current Outpatient Prescriptions  Medication Sig Dispense Refill  . acetaminophen (TYLENOL) 500 MG tablet Take 500 mg by mouth every 6 (six) hours as needed for mild pain.    . Calcium Carb-Cholecalciferol (CALCIUM 600 + D PO) Take 1 tablet by mouth daily.    . chlordiazePOXIDE (LIBRIUM) 10 MG capsule Take 2 capsules by mouth daily.    . divalproex (DEPAKOTE ER) 250 MG 24 hr tablet Take 2 tablets (500 mg total) by mouth at bedtime. 60 tablet 5  . docusate sodium (COLACE) 100 MG capsule Take by mouth.    . estradiol (ESTRACE) 0.1 MG/GM vaginal cream Use 1/2 g vaginally two times per week. 42.5 g 1  . lansoprazole (PREVACID) 30 MG capsule Take 30 mg by mouth 2 (two) times daily before a meal.     . Nutritional Supplements (VITAMIN D MAINTENANCE PO) Take 10,000 Units by mouth daily.    . Omega-3 Fatty Acids (FISH OIL) 1000 MG CAPS Take 1,000 mg by mouth daily.     . polyethylene glycol (MIRALAX / GLYCOLAX) packet Take 17 g by mouth daily as needed (constipation).    . simethicone (PHAZYME) 125 MG chewable tablet Chew 250 mg  by mouth every 6 (six) hours as needed for flatulence.    . SUMAtriptan (IMITREX) 100 MG tablet Take 1 tablet.  May repeat once in 2 hours if headache persists or recurs. 10 tablet 2  . XIIDRA 5 % SOLN      No current facility-administered medications for this visit.      ALLERGIES: Aspirin; Crestor [rosuvastatin calcium]; Inderal [propranolol]; Other; Relpax [eletriptan hydrobromide]; Salicylates; and Topamax [topiramate]  Family History  Problem Relation Age of Onset  . Migraines Mother   . Migraines Sister   . Cancer Father        Prostate    Social History   Social History  . Marital status: Married    Spouse name: N/A  . Number of children: N/A  . Years of education: N/A   Occupational History  . LPN    Social History Main Topics  . Smoking status: Never Smoker  . Smokeless  tobacco: Never Used  . Alcohol use No  . Drug use: No  . Sexual activity: Yes    Birth control/ protection: Post-menopausal   Other Topics Concern  . Not on file   Social History Narrative   Pt has L.P.N through [facility]    ROS:  Pertinent items are noted in HPI.  PHYSICAL EXAMINATION:    BP 98/70 (BP Location: Right Arm, Patient Position: Sitting, Cuff Size: Normal)   Pulse 84   Resp 16   Wt 134 lb (60.8 kg)   LMP 08/07/2001 (Approximate)   BMI 27.06 kg/m     General appearance: alert, cooperative and appears stated age  Pelvic: External genitalia:  no lesions              Urethra:  normal appearing urethra with no masses, tenderness or lesions              Bartholins and Skenes: normal                 Vagina: normal appearing vagina with normal color and discharge, no lesions              Cervix: no lesions                Bimanual Exam:  Uterus:  normal size, contour, position, consistency, mobility, non-tender              Adnexa: no mass, fullness, tenderness            Pessary removed, cleansed, and replaced.   Chaperone was present for exam.  ASSESSMENT  STD screening.  Vaginal discharge.  Pelvic mass. Tiny left adnexal mass.  Normal CA125 and normal CEA.  Hx LGSIL pap in 12/17.  Recent 6 month follow up pap was normal.  Episode of postmenopausal bleeding.  Most likely this was from atrophy and pessary use.   PLAN  Affirm and GC/CT today.  OK to continue pessary use.  Return for pelvic ultrasound in beginning August for a recheck of left adnexa.  I am not recommending surgical intervention.  Follow up with PCP for pap in December 2018.    An After Visit Summary was printed and given to the patient.  ___15___ minutes face to face time of which over 50% was spent in counseling.

## 2017-02-01 LAB — VAGINITIS/VAGINOSIS, DNA PROBE
CANDIDA SPECIES: POSITIVE — AB
GARDNERELLA VAGINALIS: POSITIVE — AB
TRICHOMONAS VAG: NEGATIVE

## 2017-02-01 LAB — GC/CHLAMYDIA PROBE AMP
Chlamydia trachomatis, NAA: NEGATIVE
Neisseria gonorrhoeae by PCR: NEGATIVE

## 2017-02-02 ENCOUNTER — Other Ambulatory Visit: Payer: Self-pay | Admitting: *Deleted

## 2017-02-02 MED ORDER — METRONIDAZOLE 500 MG PO TABS: 500.0000 mg | ORAL_TABLET | Freq: Two times a day (BID) | ORAL | 0 refills | Status: DC

## 2017-02-02 MED ORDER — FLUCONAZOLE 150 MG PO TABS: ORAL_TABLET | ORAL | 0 refills | Status: DC

## 2017-02-12 ENCOUNTER — Ambulatory Visit (INDEPENDENT_AMBULATORY_CARE_PROVIDER_SITE_OTHER): Payer: Medicare Other | Admitting: Obstetrics and Gynecology

## 2017-02-12 ENCOUNTER — Telehealth: Payer: Self-pay | Admitting: Obstetrics and Gynecology

## 2017-02-12 ENCOUNTER — Encounter: Payer: Self-pay | Admitting: Obstetrics and Gynecology

## 2017-02-12 VITALS — BP 102/70 | HR 88 | Temp 97.8°F | Resp 16 | Wt 134.0 lb

## 2017-02-12 DIAGNOSIS — N8110 Cystocele, unspecified: Secondary | ICD-10-CM

## 2017-02-12 DIAGNOSIS — N760 Acute vaginitis: Secondary | ICD-10-CM

## 2017-02-12 MED ORDER — NYSTATIN-TRIAMCINOLONE 100000-0.1 UNIT/GM-% EX CREA: 1.0000 "application " | TOPICAL_CREAM | Freq: Two times a day (BID) | CUTANEOUS | 0 refills | Status: DC

## 2017-02-12 NOTE — Telephone Encounter (Signed)
[name] finished her last dose of diflucan and flagyl. She states the yeast infection has come back and is worst then before. [name] wants to see Dr. Edward JollySilva no available appointments for today.

## 2017-02-12 NOTE — Progress Notes (Signed)
GYNECOLOGY  VISIT   HPI: 66 y.o.   Married  Caucasian  female   G2P1002 with Patient's last menstrual period was 08/07/2001 (approximate).   here for vaginal discharge, discomfort, redness.  Treated for yeast and bacterial vaginosis noted on Affirm on 01/31/17.  She took Diflucan x 2 and Flagyl course.  This resolved her symptoms.   Today, she has white discharge and itching.  No burning.   Uses a pessary.   Has not used vaginal estrogen cream for a couple of months.   Has a little bit of low back pressure lying on the table here today. Had this before she started her use of the pessary.  No dysuria.  Did not go to church because she is not feeling great.   Has echogenic area on her left ovary and had negative CA125 and negative CEA.  Follow up ultrasound due in August.  GYNECOLOGIC HISTORY: Patient's last menstrual period was 08/07/2001 (approximate). Contraception:  Postmenopausal Menopausal hormone therapy:  Estrace with pessary Last mammogram:  07/26/16 Density B/BIRADS1:neg: TBC Last pap smear:   01/17/17 done with Dr. Kevan Ny 07/17/16 CIN1/LSIL. HR HPV:Neg        OB History    Gravida Para Term Preterm AB Living   2 2 1     2    SAB TAB Ectopic Multiple Live Births           2         Patient Active Problem List   Diagnosis Date Noted  . Dry eye syndrome 03/17/2016  . Headache 03/17/2016  . Hyperlipidemia 03/17/2016  . Lack of bladder control 03/17/2016  . Migraines 03/17/2016  . 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 Abnormal Pap smear of cervix 07/2016   CIN1  . Constipation   . Depression   . Diverticulosis   . H. pylori infection   . History of blood transfusion 1984  . Hx of colonic polyps   . Hypercholesteremia   . Vitamin D deficiency     Past Surgical History:  Procedure Laterality Date  . CESAREAN SECTION  1984  . COLONOSCOPY   2012  . COSMETIC SURGERY     Neck lift  . HEMORRHOID SURGERY  1984  . RECTAL PROLAPSE REPAIR     Dr. Kendrick Ranch    Current Outpatient Prescriptions  Medication Sig Dispense Refill  . acetaminophen (TYLENOL) 500 MG tablet Take 500 mg by mouth every 6 (six) hours as needed for mild pain.    . Calcium Carb-Cholecalciferol (CALCIUM 600 + D PO) Take 1 tablet by mouth daily.    . chlordiazePOXIDE (LIBRIUM) 10 MG capsule Take 2 capsules by mouth daily.    . divalproex (DEPAKOTE ER) 250 MG 24 hr tablet Take 2 tablets (500 mg total) by mouth at bedtime. 60 tablet 5  . docusate sodium (COLACE) 100 MG capsule Take by mouth.    . estradiol (ESTRACE) 0.1 MG/GM vaginal cream Use 1/2 g vaginally two times per week. 42.5 g 1  . lansoprazole (PREVACID) 30 MG capsule Take 30 mg by mouth 2 (two) times daily before a meal.     . Nutritional Supplements (VITAMIN D MAINTENANCE PO) Take 10,000 Units by mouth daily.    . Omega-3 Fatty Acids (FISH OIL) 1000 MG CAPS Take 1,000 mg by mouth daily.     . polyethylene glycol (MIRALAX / GLYCOLAX) packet Take 17 g by mouth  daily as needed (constipation).    . simethicone (PHAZYME) 125 MG chewable tablet Chew 250 mg by mouth every 6 (six) hours as needed for flatulence.    . SUMAtriptan (IMITREX) 100 MG tablet Take 1 tablet.  May repeat once in 2 hours if headache persists or recurs. 10 tablet 2  . XIIDRA 5 % SOLN      No current facility-administered medications for this visit.      ALLERGIES: Aspirin; Crestor [rosuvastatin calcium]; Inderal [propranolol]; Other; Relpax [eletriptan hydrobromide]; Salicylates; and Topamax [topiramate]  Family History  Problem Relation Age of Onset  . Migraines Mother   . Migraines Sister   . Cancer Father        Prostate    Social History   Social History  . Marital status: Married    Spouse name: N/A  . Number of children: N/A  . Years of education: N/A   Occupational History  . LPN    Social History Main Topics  .  Smoking status: Never Smoker  . Smokeless tobacco: Never Used  . Alcohol use No  . Drug use: No  . Sexual activity: Yes    Birth control/ protection: Post-menopausal   Other Topics Concern  . Not on file   Social History Narrative   Pt has L.P.N through [facility]    ROS:  Pertinent items are noted in HPI.  PHYSICAL EXAMINATION:    BP 102/70 (BP Location: Right Arm, Patient Position: Sitting, Cuff Size: Normal)   Pulse 88   Temp 97.8 F (36.6 C) (Oral)   Resp 16   Wt 134 lb (60.8 kg)   LMP 08/07/2001 (Approximate)   BMI 27.06 kg/m     General appearance: alert, cooperative and appears stated age.  Anxious.    Pelvic: External genitalia:  no lesions              Urethra:  normal appearing urethra with no masses, tenderness or lesions              Bartholins and Skenes: normal                 Vagina: normal appearing vagina with normal color and discharge, no lesions              Cervix: no lesions                Bimanual Exam:  Uterus:  normal size, contour, position, consistency, mobility, non-tender              Adnexa: no mass, fullness, tenderness           Pessary removed, cleansed, and replaced.   Chaperone was present for exam.  ASSESSMENT  Incomplete uterovaginal prolapse. Vulvovaginitis.   PLAN  Affirm.  Mycolog II for external vulvitis symptoms.  Remove pessary at least once a week and place 1/2 gram of estrogen cream on pessary. In unable to remove the pessary on her own, return for pessary care every 4 - 6 weeks initially.  Follow up prn.   An After Visit Summary was printed and given to the patient.  _15_____ minutes face to face time of which over 50% was spent in counseling.

## 2017-02-12 NOTE — Patient Instructions (Addendum)
Please let me know if the Mycolog is very expensive. This is used externally to treat inflammation and yeast infection. We will then need to separate out the medications.   Try to remove the pessary once a week on Mondays and place the vaginal estrogen cream on it before your place it inside again.  Use just 1/2 gram of the estrogen cream once a week.   If needed, I can see you every 4 - 6 weeks for pessary maintenance.   The office will contact you when the internal vaginitis testing is ready.

## 2017-02-12 NOTE — Telephone Encounter (Signed)
Spoke with patient. Patient states that she took full course of Flagyl and Diflucan. Vaginal discharge and itching have returned and are worse than when she was seen on 01/28/2017. Requesting an appointment today with Dr.Silva. Appointment scheduled for today 02/12/2017 at 11:20 am with Dr.Silva as a work in per Billie RuddySally Yeakley, Charity fundraiserN. Patient is aware this is a work in appointment.  Routing to provider for final review. Patient agreeable to disposition. Will close encounter.

## 2017-02-14 LAB — VAGINITIS/VAGINOSIS, DNA PROBE
Candida Species: POSITIVE — AB
GARDNERELLA VAGINALIS: POSITIVE — AB
Trichomonas vaginosis: NEGATIVE

## 2017-02-15 ENCOUNTER — Telehealth: Payer: Self-pay

## 2017-02-15 ENCOUNTER — Encounter: Payer: Self-pay | Admitting: Obstetrics and Gynecology

## 2017-02-15 MED ORDER — FLUCONAZOLE 150 MG PO TABS: 150.0000 mg | ORAL_TABLET | Freq: Once | ORAL | 0 refills | Status: AC

## 2017-02-15 MED ORDER — METRONIDAZOLE 500 MG PO TABS: 500.0000 mg | ORAL_TABLET | Freq: Two times a day (BID) | ORAL | 0 refills | Status: DC

## 2017-02-15 NOTE — Telephone Encounter (Signed)
Spoke with patient. Results given. Patient verbalizes understanding. Rx for Diflucan 150 mg po x 1 repeat in 72 hours #2 0RF and Flagyl 500 mg po bid for 7 days #14 0RF sent to pharmacy on file. Avoid alcohol during treatment and 24 hours after completing medication. Don't mix with alcohol if mixed can cause severe nausea, vomiting and abdominal cramping.Patient verbalizes understanding.  Will close encounter.

## 2017-02-15 NOTE — Telephone Encounter (Signed)
-----   Message from Patton SallesBrook E Amundson C Silva, MD sent at 02/14/2017  1:42 PM EDT ----- Please contact patient with repeat Affirm results showing BV and yeast.  I am recommending Diflucan 150 mg po x 1.  May repeat in 72 hours.  #2, RF none.  She will also need tx for the BV.  I recommend the Flagyl 500 mg po bid for 7 days.   ETOH precautions.

## 2017-02-21 ENCOUNTER — Telehealth: Payer: Self-pay | Admitting: Obstetrics and Gynecology

## 2017-02-21 NOTE — Telephone Encounter (Signed)
Spoke with patient regarding benefit for follow up ultrasound. Patient understood and agreeable. Patient ready to schedule. Patient scheduled 03/08/17 with Dr Edward JollySilva. Patient aware of date, arrival time and and cancellation policy. No further questions.   Routing to Dr Edward JollySilva for reveiw

## 2017-02-21 NOTE — Telephone Encounter (Signed)
Thank you for the update.  Encounter closed. 

## 2017-02-22 ENCOUNTER — Encounter (HOSPITAL_COMMUNITY): Payer: Self-pay

## 2017-02-22 ENCOUNTER — Emergency Department (HOSPITAL_COMMUNITY)
Admission: EM | Admit: 2017-02-22 | Discharge: 2017-02-23 | Disposition: A | Discharge status: Home / Self Care | Payer: Medicare Other | Attending: Emergency Medicine | Admitting: Emergency Medicine

## 2017-02-22 DIAGNOSIS — Z79899 Other long term (current) drug therapy: Secondary | ICD-10-CM | POA: Insufficient documentation

## 2017-02-22 DIAGNOSIS — R51 Headache: Secondary | ICD-10-CM

## 2017-02-22 DIAGNOSIS — R519 Headache, unspecified: Secondary | ICD-10-CM

## 2017-02-22 DIAGNOSIS — E871 Hypo-osmolality and hyponatremia: Secondary | ICD-10-CM | POA: Insufficient documentation

## 2017-02-22 HISTORY — DX: Migraine, unspecified, not intractable, without status migrainosus: G43.909

## 2017-02-22 HISTORY — DX: Hypo-osmolality and hyponatremia: E87.1

## 2017-02-22 LAB — CBC WITH DIFFERENTIAL/PLATELET
Basophils Absolute: 0 10*3/uL (ref 0.0–0.1)
Basophils Relative: 1 %
Eosinophils Absolute: 0.1 10*3/uL (ref 0.0–0.7)
Eosinophils Relative: 2 %
HCT: 36 % (ref 36.0–46.0)
Hemoglobin: 12.3 g/dL (ref 12.0–15.0)
Lymphocytes Relative: 38 %
Lymphs Abs: 2.2 10*3/uL (ref 0.7–4.0)
MCH: 31.5 pg (ref 26.0–34.0)
MCHC: 34.2 g/dL (ref 30.0–36.0)
MCV: 92.1 fL (ref 78.0–100.0)
Monocytes Absolute: 0.5 10*3/uL (ref 0.1–1.0)
Monocytes Relative: 9 %
Neutro Abs: 3 10*3/uL (ref 1.7–7.7)
Neutrophils Relative %: 50 %
Platelets: 215 10*3/uL (ref 150–400)
RBC: 3.91 MIL/uL (ref 3.87–5.11)
RDW: 12.2 % (ref 11.5–15.5)
WBC: 5.9 10*3/uL (ref 4.0–10.5)

## 2017-02-22 LAB — URINALYSIS, ROUTINE W REFLEX MICROSCOPIC
BILIRUBIN URINE: NEGATIVE
Glucose, UA: NEGATIVE mg/dL
Ketones, ur: NEGATIVE mg/dL
Leukocytes, UA: NEGATIVE
NITRITE: NEGATIVE
Protein, ur: NEGATIVE mg/dL
SPECIFIC GRAVITY, URINE: 1.009 (ref 1.005–1.030)
pH: 7 (ref 5.0–8.0)

## 2017-02-22 LAB — BASIC METABOLIC PANEL
Anion gap: 5 (ref 5–15)
BUN: 15 mg/dL (ref 6–20)
CO2: 24 mmol/L (ref 22–32)
Calcium: 8.9 mg/dL (ref 8.9–10.3)
Chloride: 100 mmol/L — ABNORMAL LOW (ref 101–111)
Creatinine, Ser: 0.51 mg/dL (ref 0.44–1.00)
GFR calc Af Amer: >60 mL/min (ref >60)
GFR calc non Af Amer: >60 mL/min (ref >60)
Glucose, Bld: 99 mg/dL (ref 65–99)
Potassium: 3.9 mmol/L (ref 3.5–5.1)
Sodium: 129 mmol/L — ABNORMAL LOW (ref 135–145)

## 2017-02-22 LAB — SODIUM, URINE, RANDOM: SODIUM UR: 78 mmol/L

## 2017-02-22 LAB — OSMOLALITY, URINE: Osmolality, Ur: 357 mOsm/kg (ref 300–900)

## 2017-02-22 LAB — TSH: TSH: 8.371 u[IU]/mL — AB (ref 0.350–4.500)

## 2017-02-22 LAB — OSMOLALITY: Osmolality: 283 mOsm/kg (ref 275–295)

## 2017-02-22 MED ORDER — SODIUM CHLORIDE 0.9 % IV BOLUS (SEPSIS)
1000.0000 mL | Freq: Once | INTRAVENOUS | Status: AC
  Administered 2017-02-22: 1000 mL via INTRAVENOUS

## 2017-02-22 MED ORDER — DIPHENHYDRAMINE HCL 50 MG/ML IJ SOLN
12.5000 mg | Freq: Once | INTRAMUSCULAR | Status: AC
  Administered 2017-02-22: 12.5 mg via INTRAVENOUS
  Filled 2017-02-22: qty 1

## 2017-02-22 MED ORDER — METOCLOPRAMIDE HCL 5 MG/ML IJ SOLN
10.0000 mg | Freq: Once | INTRAMUSCULAR | Status: AC
  Administered 2017-02-22: 10 mg via INTRAVENOUS
  Filled 2017-02-22: qty 2

## 2017-02-22 MED ORDER — MAGNESIUM SULFATE 2 GM/50ML IV SOLN
2.0000 g | Freq: Once | INTRAVENOUS | Status: AC
  Administered 2017-02-22: 2 g via INTRAVENOUS
  Filled 2017-02-22: qty 50

## 2017-02-22 NOTE — ED Triage Notes (Signed)
Per [facility]: Pt has been complaining of headache, weakness, dizziness, nausea, feeling lightheaded, and having chills at night for the last week. Pt states that she has been having to eat "every 2 hours, and my headache will go away. I have migraines, but this doesn't feel like my migraine." Pt states that she has hx of migraines and hyponatremia. No neuro deficits noted. Pt states that her headache is 8/10, pain is the at the front of the head.

## 2017-02-22 NOTE — ED Notes (Signed)
Pt states she is feeling worse and wishes to see the MD again.

## 2017-02-22 NOTE — ED Provider Notes (Addendum)
[facility]-EMERGENCY DEPT Provider Note   CSN: [id number] Arrival date & time: 02/22/17  1707  By signing my name below, I, Rosario AdieWilliam Andrew Hiatt, attest that this documentation has been prepared under the direction and in the presence of Raeford RazorKohut, Delbert Vu, MD. Electronically Signed: Rosario AdieWilliam Andrew Hiatt, ED Scribe. 02/22/17. 7:12 PM.  History   Chief Complaint Chief Complaint  Patient presents with  . Headache  . multiple complaints   The history is provided by the patient. No language interpreter was used.    HPI Comments: [name] is a 66 y.o. female who presents to the Emergency Department complaining of intermittent, dull, frontal headache beginning 2-3 weeks ago. Pt reports that she has a h/o migraine headaches for which she is followed by Dr Lin GivensJeffries of Neurology. She states that she is prescribed Depakote for this; however, over the past two to three weeks she has been experiencing a intermittent headache which does not feel typical of her usual migraines. She also reports that she will intermittently feel polyphagic, stating that she has had to eat every 2-3 hours persistently to feel satiated. She reports that if she does not eat that she will notice her headache come on; additionally, she states that soon after eating this will alleviate her headache. She has also been taking Imitrex at home w/o relief. She also notes some light-headedness, fatigue, photophobia, chills, nocturnal hyperhidrosis, and anxiousness secondary to this as well. She denies fever, neck pain, neck stiffness, visual disturbance, numbness, paraesthesias, confusion, unexpected weight change, or any other associated symptoms.   Past Medical History:  Diagnosis Date  . Abnormal Pap smear of cervix 07/2016   CIN1  . Constipation   . Depression   . Diverticulosis   . H. pylori infection   . History of blood transfusion 1984  . Hx of colonic polyps   . Hypercholesteremia   . Hyponatremia   . Migraines   .  Vitamin D deficiency    Patient Active Problem List   Diagnosis Date Noted  . Dry eye syndrome 03/17/2016  . Headache 03/17/2016  . Hyperlipidemia 03/17/2016  . Lack of bladder control 03/17/2016  . Migraines 03/17/2016  . 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 CESAREAN SECTION  1984  . COLONOSCOPY  2012  . COSMETIC SURGERY     Neck lift  . HEMORRHOID SURGERY  1984  . RECTAL PROLAPSE REPAIR     Dr. Kendrick Ranchim Davis   OB History    Rhett BannisterGravida Para Term Preterm AB Living   2 2 1     2    SAB TAB Ectopic Multiple Live Births           2     Home Medications    Prior to Admission medications   Medication Sig Start Date End Date Taking? Authorizing Provider  acetaminophen (TYLENOL) 500 MG tablet Take 500 mg by mouth every 6 (six) hours as needed for mild pain.    [provider]  Calcium Carb-Cholecalciferol (CALCIUM 600 + D PO) Take 1 tablet by mouth daily.    [provider]  chlordiazePOXIDE (LIBRIUM) 10 MG capsule Take 2 capsules by mouth daily. 08/10/16   [provider]  divalproex (DEPAKOTE ER) 250 MG 24 hr tablet Take 2 tablets (500 mg total) by mouth at bedtime. 11/24/16   Drema DallasJaffe, Adam R, DO  docusate sodium (COLACE) 100 MG capsule Take by mouth.  [provider]  estradiol (ESTRACE) 0.1 MG/GM vaginal cream Use 1/2 g vaginally two times per week. 09/13/16   Patton Salles, MD  lansoprazole (PREVACID) 30 MG capsule Take 30 mg by mouth 2 (two) times daily before a meal.     [provider]  metroNIDAZOLE (FLAGYL) 500 MG tablet Take 1 tablet (500 mg total) by mouth 2 (two) times daily. 02/15/17   Patton Salles, MD  Nutritional Supplements (VITAMIN D MAINTENANCE PO) Take 10,000 Units by mouth daily.    [provider]  nystatin-triamcinolone (MYCOLOG II) cream Apply 1 application topically 2 (two) times daily. Apply to  affected area BID for up to 7 days. 02/12/17   Patton Salles, MD  Omega-3 Fatty Acids (FISH OIL) 1000 MG CAPS Take 1,000 mg by mouth daily.     [provider]  polyethylene glycol (MIRALAX / GLYCOLAX) packet Take 17 g by mouth daily as needed (constipation).    [provider]  simethicone (PHAZYME) 125 MG chewable tablet Chew 250 mg by mouth every 6 (six) hours as needed for flatulence.    [provider]  SUMAtriptan (IMITREX) 100 MG tablet Take 1 tablet.  May repeat once in 2 hours if headache persists or recurs. 03/17/16   Drema Dallas, DO  XIIDRA 5 % SOLN  02/21/16   [provider]   Family History Family History  Problem Relation Age of Onset  . Migraines Mother   . Migraines Sister   . Cancer Father        Prostate   Social History Social History  Substance Use Topics  . Smoking status: Never Smoker  . Smokeless tobacco: Never Used  . Alcohol use No   Allergies   Aspirin; Crestor [rosuvastatin calcium]; Inderal [propranolol]; Other; Relpax [eletriptan hydrobromide]; Salicylates; and Topamax [topiramate]  Review of Systems Review of Systems  Constitutional: Positive for chills, diaphoresis ( +nocturnal hyperhidrosis) and fatigue. Negative for fever and unexpected weight change.  Eyes: Positive for photophobia. Negative for visual disturbance.  Musculoskeletal: Negative for neck pain and neck stiffness.  Neurological: Positive for headaches. Negative for numbness.  Psychiatric/Behavioral: Negative for confusion. The patient is nervous/anxious.   All other systems reviewed and are negative.  Physical Exam Updated Vital Signs BP 130/62   Pulse 72   Temp 98.4 F (36.9 C) (Oral)   Resp 17   LMP 08/07/2001 (Approximate)   SpO2 100%   Physical Exam  Constitutional: She is oriented to person, place, and time. She appears well-developed and well-nourished.  HENT:  Head: Normocephalic.  Right Ear: External ear normal.  Left  Ear: External ear normal.  Nose: Nose normal.  Mouth/Throat: Oropharynx is clear and moist.  Eyes: Conjunctivae are normal. Right eye exhibits no discharge. Left eye exhibits no discharge.  Neck: Normal range of motion.  Cardiovascular: Normal rate, regular rhythm and normal heart sounds.   No murmur heard. Pulmonary/Chest: Effort normal and breath sounds normal. No respiratory distress. She has no wheezes. She has no rales.  Abdominal: Soft. She exhibits no distension. There is no tenderness. There is no rebound and no guarding.  Musculoskeletal: Normal range of motion. She exhibits no edema or tenderness.  Neurological: She is alert and oriented to person, place, and time. She displays normal reflexes. No cranial nerve deficit or sensory deficit. She exhibits normal muscle tone. Coordination normal.  Cerebellar testing intact. Normal finger to nose bilaterally. CN 2-12 intact.   Skin:  Skin is warm and dry. No rash noted. No erythema. No pallor.  Psychiatric: She has a normal mood and affect. Her behavior is normal.  Nursing note and vitals reviewed.  ED Treatments / Results  DIAGNOSTIC STUDIES: Oxygen Saturation is 100% on RA, normal by my interpretation.   COORDINATION OF CARE: 7:11 PM-Discussed next steps with pt. Pt verbalized understanding and is agreeable with the plan.   Labs (all labs ordered are listed, but only abnormal results are displayed) Labs Reviewed  BASIC METABOLIC PANEL - Abnormal; Notable for the following:       Result Value   Sodium 129 (*)    Chloride 100 (*)    All other components within normal limits  CBC WITH DIFFERENTIAL/PLATELET    EKG  EKG Interpretation None      Radiology No results found.  Procedures Procedures   Medications Ordered in ED Medications - No data to display  Initial Impression / Assessment and Plan / ED Course  I have reviewed the triage vital signs and the nursing notes.  Pertinent labs & imaging results that were  available during my care of the patient were reviewed by me and considered in my medical decision making (see chart for details).     66yF with multiple complaints. Many of them potentially could be attributed to hyponatremia (headache, fatigue, feeling "foggy", nausea, etc).  Na 129. Per self report, she was recently told it was 132. I had several long discussions with her. Since she is possibly symptomatic from this, I advised admitting her and further working this up. She denies excessive water consumption (she says she limits it to eight 8oz servings per day and is not getting extensive fluids from other sources). No obvious offending medications on her current list.   She is extremely indecisive. She has medical decision making capability though and would prefer to follow-up with Dr Paulino Rily tomorrow. I even offered to try and expedite the work-up and obtain additional studies with the understanding that they would have to be followed up by her PCP. She continues to decline. She understands benefit of hospitalization and that hyponatremia can potentially be life threatening. Advised her to restrict her fluid intake. Preferably no additional fluid tonight and have no more than 32 oz of water until she is seen tomorrow.   Her neuro exam is nonfocal. I doubt emergent secondary cause of her HA otherwise (bleed, meningitis, acute glaucoma, etc).   9:05 PM Pt reconsidering and is agreeable to admission. Will discuss with medicine.   Final Clinical Impressions(s) / ED Diagnoses   Final diagnoses:  Hyponatremia  Nonintractable headache, unspecified chronicity pattern, unspecified headache type   New Prescriptions New Prescriptions   No medications on file   I personally preformed the services scribed in my presence. The recorded information has been reviewed is accurate. Raeford Razor, MD.        Raeford Razor, MD 02/22/17 2107

## 2017-02-22 NOTE — ED Notes (Addendum)
Pt. States that if a doctor does not come soon then she's going to leave because she has to eat every two hours. Pt. Ambulated to restroom with RN assistance. Pt. States she feels jittery, but insists on walking to restroom and not using bed pan. EDP made aware.

## 2017-02-22 NOTE — Consult Note (Signed)
We obtained a TSH to check your thyroid hormone.  We also obtained a urine sodium level and urine and serum osmolalities.  These tests Dr. Zollie Beckers can review and help to understand if your sodium level is low because of a condition called SIADH, which will respond to fluid restriction and careful review of the medicines you take, or if it is from dehydration. Dr. Zollie Beckers can and should repeat a sodium level tomorrow (Friday).                       Hospitalist Service Medical Consultation   [name]  [name]:[id number]  DOB: [birth year]  DOA: 02/22/2017  PCP: Whitney Pollack, MD   Outpatient Specialists:  [name], Neurology/Headaches     [name]   Requesting physician: Dr. Juleen Peterson  Reason for consultation: Hyponatremia   History of Present Illness: [name] is an 66 y.o. female with past medical history significant for migraines who presents with 2-3 weeks malaise, frequent daily headaches, polyphagia, cold intolerance, fatigue.    The patient was in her usual state of health until about 3 weeks ago when she started to have intermittent dull, mild-to-moderate intensity headache, different in character from her chronic migraines for which she takes depatoke and PRN imitrex.  In the meantime, she has also started to notice fatigue, polyphagia, cold intolerance, night sweats (no fever), difficulty concentrating, dizziness/lightheadedness and generalized malaise.  SHe has had no LOC, seizure, frank confusion/disorientation.    ED course: -afebrile, heart rate 76, respirations and pulse is normal, blood pressure 127/74 -Sodium 129, potassium 3.9, creatinine 0.51, WBC 5.9, hemoglobin 12.3 -TRH were asked to evaluate for hyponatremia   She has had no fever, cough, sputum production, dysuria, suprapubic/flank pain, urinary frequency.  She takes no SSRIs, opioids.  She lowered her depakote dose on her own a few months ago, and was prescribed Flagyl and fluconazole for  vaginitis recently, otherwise no new meds, no new OTCs.  She takes librium chronically for sleep.  She does not drink alcohol daily, she eats a balanced diet.  She was told by her PCP back in January that her sodium was 132.       Review of Systems:  As per HPI otherwise 10 systems were reviewed and were negative.   Past Medical History: Past Medical History:  Diagnosis Date  . Abnormal Pap smear of cervix 07/2016   CIN1  . Constipation   . Depression   . Diverticulosis   . H. pylori infection   . History of blood transfusion 1984  . Hx of colonic polyps   . Hypercholesteremia   . Hyponatremia   . Migraines   . Vitamin D deficiency     Past Surgical History: Past Surgical History:  Procedure Laterality Date  . CESAREAN SECTION  1984  . COLONOSCOPY  2012  . COSMETIC SURGERY     Neck lift  . HEMORRHOID SURGERY  1984  . RECTAL PROLAPSE REPAIR     Dr. Kendrick Peterson     Allergies:   Allergies  Allergen Reactions  . Aspirin Other (See Comments)    Hx of bleeding stomach ulcer   . Crestor [Rosuvastatin Calcium] Itching  . Inderal [Propranolol] Other (See Comments)    Chest tightness  . Other     Other reaction(s): Other (See Comments) Cholesterol meds-multiple reactions to multiple meds  . Relpax [Eletriptan Hydrobromide] Other (See Comments)    Chest Tightness   . Salicylates Other (See Comments)  Told not to take due to hx of GI ulcer  . Topamax [Topiramate] Other (See Comments)    Chest tightness     Social History:  reports that she has never smoked. She has never used smokeless tobacco. She reports that she does not drink alcohol or use drugs.  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  She lives alone.   Family History: Family History  Problem Relation Age of Onset  . Migraines Mother   . Migraines Sister   . Cancer Father        Prostate     Physical Exam: Vitals:   02/22/17 1945 02/22/17 2015 02/22/17 2030  02/22/17 2100  BP: 132/89 134/72 122/74 120/77  Pulse: 71 92 87 80  Resp: 18 (!) 23 (!) 21 20  Temp:      TempSrc:      SpO2: 100% 99% 99% 99%    Constitutional: Alert and awake, oriented x3, not in any acute distress but appears uncomfortable, anxious. Eyes: PERLA, EOMI, irises appear normal, anicteric sclera,  ENMT: external ears and nose appear normal, hearing normal            Lips appears normal, oropharynx mucosa, tongue, posterior pharynx appear normal  Neck: neck appears normal, no masses, normal ROM, no thyromegaly, no JVD  CVS: S1-S2 clear, no murmur rubs or gallops, no LE edema, normal pedal pulses  Respiratory:  clear to auscultation bilaterally, no wheezing, rales or rhonchi. Respiratory effort normal. No accessory muscle use.  GI: soft nontender, nondistended, normal bowel sounds, no hepatosplenomegaly, no hernias  Musculoskeletal: no cyanosis, clubbing or edema noted bilaterally Neuro: Pupils are 3 mm and reactive to 2 mm.  Extraocular movements are intact, without nystagmus.  Cranial nerve 5 is within normal limits.  Cranial nerve 7 is symmetrical.  Cranial nerve 8 is within normal limits.  Cranial nerves 9 and 10 reveal equal palate elevation.  Cranial nerve 11 reveals sternocleidomastoid strong.  Cranial nerve 12 is midline.  Motor strength testing is 5/5 in the upper and lower extremities bilaterally with normal motor, tone and bulk. Sensory examination is intact to light touch.  No pronator drift.  Finger-to-nose testing is within normal limits. The patient is oriented to time, place and person.  Speech is fluent.  Naming is grossly intact.  Recall, recent and remote, as well as general fund of knowledge seem within normal limits.  Attention span and concentration are within normal limits.  Psych: judgement and insight appear normal, stable mood and affect, mental status Skin: no rashes or lesions or ulcers, no induration or nodules    Data reviewed:  I have personally  reviewed following labs and imaging studies Labs:  CBC:  Recent Labs Lab 02/22/17 1926  WBC 5.9  NEUTROABS 3.0  HGB 12.3  HCT 36.0  MCV 92.1  PLT 215    Basic Metabolic Panel:  Recent Labs Lab 02/22/17 1926  NA 129*  K 3.9  CL 100*  CO2 24  GLUCOSE 99  BUN 15  CREATININE 0.51  CALCIUM 8.9   GFR Estimated Creatinine Clearance: 55.6 mL/min (by C-G formula based on SCr of 0.51 mg/dL).  Urinalysis    Component Value Date/Time   COLORURINE YELLOW 09/30/2012 1710   APPEARANCEUR CLEAR 09/30/2012 1710   LABSPEC 1.012 09/30/2012 1710   PHURINE 6.0 09/30/2012 1710   GLUCOSEU NEGATIVE 09/30/2012 1710   HGBUR NEGATIVE 09/30/2012 1710   BILIRUBINUR n 01/17/2017 1613   KETONESUR NEGATIVE 09/30/2012  1710   PROTEINUR n 01/17/2017 1613   PROTEINUR NEGATIVE 09/30/2012 1710   UROBILINOGEN 0.2 01/17/2017 1613   UROBILINOGEN 0.2 09/30/2012 1710   NITRITE n 01/17/2017 1613   NITRITE NEGATIVE 09/30/2012 1710   LEUKOCYTESUR Moderate (2+) (A) 01/17/2017 1613       Inpatient Medications:   Scheduled Meds: Continuous Infusions: . magnesium sulfate 1 - 4 g bolus IVPB       Impression/Recommendations 1. Hyponatremia: I suspect her nonspecific symptoms are not related to hyponatremia (which she reports has been present since at least January, while her headaches, malaise only for the last 2 weeks) and that this represents a long-term chronic asymptomatic hyponatremia.  The most likely culprit for this I believe would be her depakote although this is speculative. -Will obtain urine Na and Uosm/Posm to see if this is more likely SIADH or dehdyration -Check TSH -Follow up with PCP tomorrow for repeat sodium level   2. Headaches: No warning signs.  Neuro exam normal.  Etiology to me unclear.            Alberteen Samhristopher P Danford M.D. [facility] 02/22/2017, 10:06 PM

## 2017-02-22 NOTE — ED Notes (Signed)
Pt verbalized understanding discharge instructions and denies any further needs or questions at this time. VS stable, ambulatory and steady gait.   

## 2017-02-23 ENCOUNTER — Encounter: Payer: Self-pay | Admitting: Obstetrics and Gynecology

## 2017-03-02 ENCOUNTER — Telehealth: Payer: Self-pay | Admitting: Obstetrics and Gynecology

## 2017-03-02 NOTE — Telephone Encounter (Signed)
Ok to move ultrasound appointment forward to mid August.  You may close the encounter.

## 2017-03-02 NOTE — Telephone Encounter (Signed)
Patient called to cancel Ultrasound originally scheduled for 03-08-17 with Dr. Edward JollySilva. States she was recently prescribed 25mg  Synthroid for hypothroidism by Dr. Shaune Pollackonna Gates and she is not tolerating it well. She states "I am so sick. I just don't know what's wrong." She notes she was recently in the Emergency Department. She states she is unwell enough to not even be able to drive a car currently.   She was unsure about rescheduling citing current health and inability to drive. Notes she has an appointment with Dr. Kevan NyGates on 03-05-17. Rescheduled ultrasound for 03-22-17 and is aware of 48 hour cancellation policy.  Routing to Dr. Edward JollySilva to review and advise.

## 2017-03-05 NOTE — Telephone Encounter (Signed)
Noted.  Will close encounter.  

## 2017-03-08 ENCOUNTER — Other Ambulatory Visit: Payer: Medicare Other

## 2017-03-08 ENCOUNTER — Other Ambulatory Visit: Payer: Medicare Other | Admitting: Obstetrics and Gynecology

## 2017-03-15 ENCOUNTER — Telehealth: Payer: Self-pay | Admitting: Obstetrics and Gynecology

## 2017-03-15 NOTE — Telephone Encounter (Signed)
Patient is scheduled for an ultrasound on 03/22/17 and is having vaginal discharge. She states it is getting worst. She wants to know if she will still need to be seen with this

## 2017-03-15 NOTE — Telephone Encounter (Signed)
Spoke with patient. Patient states that she took Diflucan that she had on hand on 03/09/2017 due to vaginal discharge. Took pessary out, cleaned it and replaced it on 03/12/2017. A few days later vaginal discharge returned. On 03/11/17 and 03/13/17 she removed pessary, cleaned it, and replaced it. Applying 1/2 gram of Estrogen cream on pessary once per week after cleaning. Woke up today with increased vaginal discharge and irritation. Denies any vaginal itching or odor with discharge. Reports pain with urination when urine touches skin. Advised she will need to be seen in the office for further evaluation. Patient is agreeable. Appointment scheduled for tomorrow 03/16/2017 at 3 pm with Dr.Silva. Patient is agreeable to date and time.  Routing to provider for final review. Patient agreeable to disposition. Will close encounter.

## 2017-03-16 ENCOUNTER — Encounter: Payer: Self-pay | Admitting: Obstetrics and Gynecology

## 2017-03-16 ENCOUNTER — Ambulatory Visit (INDEPENDENT_AMBULATORY_CARE_PROVIDER_SITE_OTHER): Payer: Medicare Other | Admitting: Obstetrics and Gynecology

## 2017-03-16 VITALS — BP 124/84 | HR 100 | Resp 16 | Ht 59.5 in | Wt 129.4 lb

## 2017-03-16 DIAGNOSIS — R300 Dysuria: Secondary | ICD-10-CM | POA: Diagnosis not present

## 2017-03-16 DIAGNOSIS — N812 Incomplete uterovaginal prolapse: Secondary | ICD-10-CM

## 2017-03-16 DIAGNOSIS — N760 Acute vaginitis: Secondary | ICD-10-CM

## 2017-03-16 DIAGNOSIS — R8290 Unspecified abnormal findings in urine: Secondary | ICD-10-CM

## 2017-03-16 LAB — POCT URINALYSIS DIPSTICK
BILIRUBIN UA: NEGATIVE
GLUCOSE UA: NEGATIVE
KETONES UA: NEGATIVE
Nitrite, UA: NEGATIVE
PROTEIN UA: NEGATIVE
Urobilinogen, UA: 0.2 E.U./dL
pH, UA: 5 (ref 5.0–8.0)

## 2017-03-16 NOTE — Patient Instructions (Addendum)
Take the pessary out twice a week at bedtime and leave it out overnight.  Place it back in the morning.   You may use your vaginal Estrace cream 1/2 gram on the pessary twice a week.   Use just soap and water for cleansing of the pessary.  Do not use any detergents or sterilizing agents.   You may use the Mycolog II cream externally twice a week for one week to treat vulvar irritation.   Dove soap is very moisturizing and neutral for skin.  It is usual skin friendly in the genital area.

## 2017-03-16 NOTE — Progress Notes (Signed)
GYNECOLOGY  VISIT   HPI: 66 y.o.   Married  Caucasian  female   G2P1002 with Patient's last menstrual period was 08/07/2001 (approximate).   here for increased amount of vaginal discharge which is white thick look and had a slight greenish tint yesterday.  Took out the pessary last night and had lots of solid green discharge.   Taking the pessary out once a week on Sundays and uses a pea size amount of estrogen cream when she does so.  Seen on 02/12/17 for vulvovaginitis. Had yeast and bacterial vaginosis.  Treated with Mycolog II, Diflucan and Flagyl.   Considering surgery for prolapse.  Went to the ER 3 weeks ago for headache.  She had hyponatremia with Na 129 and was noted to have elevated TSH of 8.371.  Started Synthroid in follow up to this but did not tolerate this.  Had a normal T3 and T4 so she stopped the Synthroid.   Suffering a lot of anxiety.  PCP wants here to see a grief counselor.   Urine dip - mod blood, 2+ WBC.  (may be from her vaginal discharge due to pessary)  GYNECOLOGIC HISTORY: Patient's last menstrual period was 08/07/2001 (approximate). Contraception: Postmenopausal Menopausal hormone therapy:  Estrace cream with pessary Last mammogram: 07/26/16 Density B/BIRADS1:neg: TBC Last pap smear:  01/17/17 done with Dr. Kevan NyGates 07/17/16 CIN1/LSIL. HR HPV:Neg        OB History    Gravida Para Term Preterm AB Living   2 2 1     2    SAB TAB Ectopic Multiple Live Births           2         Patient Active Problem List   Diagnosis Date Noted  . Dry eye syndrome 03/17/2016  . Headache 03/17/2016  . Hyperlipidemia 03/17/2016  . Lack of bladder control 03/17/2016  . Migraines 03/17/2016  . 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 Abnormal Pap smear of cervix 07/2016   CIN1  . Constipation   . Depression   . Diverticulosis   . H. pylori infection    . History of blood transfusion 1984  . Hx of colonic polyps   . Hypercholesteremia   . Hyponatremia   . Migraines   . Vitamin D deficiency     Past Surgical History:  Procedure Laterality Date  . CESAREAN SECTION  1984  . COLONOSCOPY  2012  . COSMETIC SURGERY     Neck lift  . HEMORRHOID SURGERY  1984  . RECTAL PROLAPSE REPAIR     Dr. Kendrick Ranchim Davis    Current Outpatient Prescriptions  Medication Sig Dispense Refill  . acetaminophen (TYLENOL) 500 MG tablet Take 500 mg by mouth every 6 (six) hours as needed for mild pain.    . Calcium Carb-Cholecalciferol (CALCIUM 600 + D PO) Take 1 tablet by mouth daily.    . chlordiazePOXIDE (LIBRIUM) 10 MG capsule Take 2 capsules by mouth daily.    . divalproex (DEPAKOTE ER) 250 MG 24 hr tablet Take 2 tablets (500 mg total) by mouth at bedtime. 60 tablet 5  . docusate sodium (COLACE) 100 MG capsule Take by mouth.    . estradiol (ESTRACE) 0.1 MG/GM vaginal cream Use 1/2 g vaginally two times per week. 42.5 g 1  . lansoprazole (PREVACID) 30 MG capsule Take 30 mg by mouth 2 (two) times daily before a meal.     .  metroNIDAZOLE (FLAGYL) 500 MG tablet Take 1 tablet (500 mg total) by mouth 2 (two) times daily. 14 tablet 0  . Nutritional Supplements (VITAMIN D MAINTENANCE PO) Take 10,000 Units by mouth daily.    [name] nystatin-triamcinolone (MYCOLOG II) cream Apply 1 application topically 2 (two) times daily. Apply to affected area BID for up to 7 days. 60 g 0  . Omega-3 Fatty Acids (FISH OIL) 1000 MG CAPS Take 1,000 mg by mouth daily.     . polyethylene glycol (MIRALAX / GLYCOLAX) packet Take 17 g by mouth daily as needed (constipation).    . simethicone (PHAZYME) 125 MG chewable tablet Chew 250 mg by mouth every 6 (six) hours as needed for flatulence.    . SUMAtriptan (IMITREX) 100 MG tablet Take 1 tablet.  May repeat once in 2 hours if headache persists or recurs. 10 tablet 2  . XIIDRA 5 % SOLN      No current facility-administered medications for this  visit.      ALLERGIES: Aspirin; Crestor [rosuvastatin calcium]; Inderal [propranolol]; Other; Relpax [eletriptan hydrobromide]; Salicylates; and Topamax [topiramate]  Family History  Problem Relation Age of Onset  . Migraines Mother   . Migraines Sister   . Cancer Father        Prostate    Social History   Social History  . Marital status: Married    Spouse name: N/A  . Number of children: N/A  . Years of education: N/A   Occupational History  . LPN    Social History Main Topics  . Smoking status: Never Smoker  . Smokeless tobacco: Never Used  . Alcohol use No  . Drug use: No  . Sexual activity: Yes    Birth control/ protection: Post-menopausal   Other Topics Concern  . Not on file   Social History Narrative   Pt has L.P.N through [facility]    ROS:  Pertinent items are noted in HPI.  PHYSICAL EXAMINATION:    BP 124/84 (BP Location: Right Arm, Patient Position: Sitting, Cuff Size: Small)   Pulse 100   Resp 16   Ht 4' 11.5" (1.511 m)   Wt 129 lb 6.4 oz (58.7 kg)   LMP 08/07/2001 (Approximate)   BMI 25.70 kg/m     General appearance: alert, cooperative and appears stated age  Pelvic: External genitalia:  no lesions              Urethra:  normal appearing urethra with no masses, tenderness or lesions              Bartholins and Skenes: normal                 Vagina: normal appearing vagina with normal color and discharge, no lesions              Cervix: no lesions                Bimanual Exam:  Uterus:  normal size, contour, position, consistency, mobility, non-tender              Adnexa: no mass, fullness, tenderness              Pessary removed, cleansed, and replaced.  Thin green discharge on the pessary.   Chaperone was present for exam.  ASSESSMENT  Incomplete uterovaginal prolapse.  Using pessary.  No signs of ulceration or inflammation from pessary use.  Vaginitis.   Echogenic left ovary.   PLAN  We talked about discharge related to  pessary  use and how this means that it needs to come out more frequently and use more vaginal estrogen cream. I recommend removing the pessary twice a week and leaving it out overnight.  She will use 1/2 gm of estrogen cream twice weekly when she puts the pessary back in.  Affirm done.  UC sent.  No treatment initiated. Return for ultrasound next week to check ovary.  We started talking about prolapse repair and recovery from this.  We will continue our discussion about potential surgical care at her next visit.   An After Visit Summary was printed and given to the patient.  _25____ minutes face to face time of which over 50% was spent in counseling.

## 2017-03-17 LAB — URINE CULTURE: Organism ID, Bacteria: NO GROWTH

## 2017-03-17 LAB — VAGINITIS/VAGINOSIS, DNA PROBE
Candida Species: POSITIVE — AB
Gardnerella vaginalis: POSITIVE — AB
Trichomonas vaginosis: NEGATIVE

## 2017-03-19 ENCOUNTER — Telehealth: Payer: Self-pay | Admitting: *Deleted

## 2017-03-19 MED ORDER — METRONIDAZOLE 500 MG PO TABS: 500.0000 mg | ORAL_TABLET | Freq: Two times a day (BID) | ORAL | 0 refills | Status: DC

## 2017-03-19 MED ORDER — FLUCONAZOLE 150 MG PO TABS: ORAL_TABLET | ORAL | 0 refills | Status: DC

## 2017-03-19 NOTE — Telephone Encounter (Signed)
Spoke with patient, advised as seen below per Dr. Silva. Patient thankful for f/u and verbalizes understanding.  Patient is agreeable to disposition. Will close encounter.  

## 2017-03-19 NOTE — Telephone Encounter (Signed)
Spoke with patient, advised as seen below per Dr. Edward JollySilva. Rx for Flagyl and diflucan to verified pharmacy on file.   Patient is concerned with leaving pessary out for a week. Is starting a new job requiring her to walk more and climb stairs. Patient is concerned with increased back pain and does not want to be "back to where she started with bladder protruding out". Patient states this is upsetting, any other recommendations? Advised patient would review with Dr. Edward JollySilva and return call with recommendations. Patient verbalizes understanding and is agreeable.  Dr. Edward JollySilva -please review and advise?

## 2017-03-19 NOTE — Telephone Encounter (Signed)
Notes recorded by Leda MinHamm, Diahn Waidelich N, RN on 03/19/2017 at 11:08 AM EDT Left message to call Noreene LarssonJill at [phone]. See telephone encounter dated 03/19/17.   ------  Notes recorded by Patton SallesAmundson C Silva, Brook E, MD on 03/18/2017 at 9:58 AM EDT Please inform of negative urine culture.  There is a separate note regarding her Affirm test which showed BV and yeast.  ------  Notes recorded by Patton SallesAmundson C Silva, Brook E, MD on 03/17/2017 at 7:02 PM EDT Please report vaginitis result to patient showing both yeast and bacterial vaginosis.  I recommend she use Flagyl 500 mg po bid x 1 week and Diflucan 150 mg po x 2 - one tab at the beginning of her treatment and one at the end of her treatment.   I would leave the pessary out for one entire week and not use the vaginal estrogen cream while she is treating the vaginitis.   Her urine culture is not back yet.  She has an appointment for pelvic ultrasound this week with me.

## 2017-03-19 NOTE — Telephone Encounter (Signed)
Ok to place the pessary back again.

## 2017-03-22 ENCOUNTER — Encounter: Payer: Self-pay | Admitting: Obstetrics and Gynecology

## 2017-03-22 ENCOUNTER — Ambulatory Visit (INDEPENDENT_AMBULATORY_CARE_PROVIDER_SITE_OTHER): Payer: Medicare Other | Admitting: Obstetrics and Gynecology

## 2017-03-22 ENCOUNTER — Ambulatory Visit (INDEPENDENT_AMBULATORY_CARE_PROVIDER_SITE_OTHER): Payer: Medicare Other

## 2017-03-22 VITALS — BP 108/70 | HR 64 | Ht 59.5 in | Wt 129.0 lb

## 2017-03-22 DIAGNOSIS — R300 Dysuria: Secondary | ICD-10-CM | POA: Diagnosis not present

## 2017-03-22 DIAGNOSIS — R319 Hematuria, unspecified: Secondary | ICD-10-CM | POA: Diagnosis not present

## 2017-03-22 DIAGNOSIS — N838 Other noninflammatory disorders of ovary, fallopian tube and broad ligament: Secondary | ICD-10-CM

## 2017-03-22 DIAGNOSIS — N812 Incomplete uterovaginal prolapse: Secondary | ICD-10-CM

## 2017-03-22 DIAGNOSIS — R1900 Intra-abdominal and pelvic swelling, mass and lump, unspecified site: Secondary | ICD-10-CM

## 2017-03-22 DIAGNOSIS — N839 Noninflammatory disorder of ovary, fallopian tube and broad ligament, unspecified: Secondary | ICD-10-CM | POA: Diagnosis not present

## 2017-03-22 LAB — POCT URINALYSIS DIPSTICK
BILIRUBIN UA: NEGATIVE
GLUCOSE UA: NEGATIVE
Ketones, UA: NEGATIVE
NITRITE UA: NEGATIVE
Protein, UA: NEGATIVE
Urobilinogen, UA: 0.2 E.U./dL
pH, UA: 5 (ref 5.0–8.0)

## 2017-03-22 NOTE — Progress Notes (Signed)
GYNECOLOGY  VISIT   HPI: 66 y.o.   Married  Caucasian  female   G2P1002 with Patient's last menstrual period was 08/07/2001 (approximate).   here for pelvic ultrasound for echogenic left ovary.    Patient complaining of some dysuria but has been having some vaginal drainage. Negative UC from 03/16/17.  Is removing the pessary nightly.   No leakage with cough, laugh, sneeze, or walking.  Having problems with chronic vaginitis - BV and yeast.   Has back pain when she does not use the pessary.  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   Urine Dip:1+WBCs, Trace RBCs  GYNECOLOGIC HISTORY: Patient's last menstrual period was 08/07/2001 (approximate). Contraception: Postmenopausal Menopausal hormone therapy:  Estrace cream with pessary Last mammogram:  07/26/16 Density B/BIRADS1:neg: TBC Last pap smear:  01/17/17 negative - done with Dr. Kevan Ny 07/17/16 CIN1/LSIL. HR HPV:Neg                OB History    Gravida Para Term Preterm AB Living   2 2 1     2    SAB TAB Ectopic Multiple Live Births           2         Patient Active Problem List   Diagnosis Date Noted  . Dry eye syndrome 03/17/2016  . Headache 03/17/2016  . Hyperlipidemia 03/17/2016  . Lack of bladder control 03/17/2016  . Migraines 03/17/2016  . 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 Abnormal Pap smear of cervix 07/2016   CIN1  . Constipation   . Depression   . Diverticulosis   . H. pylori infection   . History of blood transfusion 1984  . Hx of colonic polyps   . Hypercholesteremia   . Hyponatremia   . Migraines   . Vitamin D deficiency     Past Surgical History:  Procedure Laterality Date  . CESAREAN SECTION  1984  . COLONOSCOPY  2012  . COSMETIC SURGERY     Neck lift  . HEMORRHOID SURGERY  1984  . RECTAL PROLAPSE REPAIR     Dr. Kendrick Ranch    Current Outpatient  Prescriptions  Medication Sig Dispense Refill  . acetaminophen (TYLENOL) 500 MG tablet Take 500 mg by mouth every 6 (six) hours as needed for mild pain.    . Calcium Carb-Cholecalciferol (CALCIUM 600 + D PO) Take 1 tablet by mouth daily.    . chlordiazePOXIDE (LIBRIUM) 10 MG capsule Take 2 capsules by mouth daily.    . divalproex (DEPAKOTE ER) 250 MG 24 hr tablet Take 2 tablets (500 mg total) by mouth at bedtime. 60 tablet 5  . docusate sodium (COLACE) 100 MG capsule Take by mouth.    . estradiol (ESTRACE) 0.1 MG/GM vaginal cream Use 1/2 g vaginally two times per week. 42.5 g 1  . fluconazole (DIFLUCAN) 150 MG tablet Take one tablet at beginning of Flagyl treatment and one tablet at the end of treatment. 2 tablet 0  . lansoprazole (PREVACID) 30 MG capsule Take 30 mg by mouth 2 (two) times daily before a meal.     . metroNIDAZOLE (FLAGYL) 500 MG tablet Take 1 tablet (500 mg total) by mouth 2 (two) times daily. 14 tablet 0  . Nutritional Supplements (VITAMIN D MAINTENANCE PO) Take 10,000 Units by mouth daily.    [name] nystatin-triamcinolone (MYCOLOG II) cream Apply 1 application topically  2 (two) times daily. Apply to affected area BID for up to 7 days. 60 g 0  . Omega-3 Fatty Acids (FISH OIL) 1000 MG CAPS Take 1,000 mg by mouth daily.     . polyethylene glycol (MIRALAX / GLYCOLAX) packet Take 17 g by mouth daily as needed (constipation).    . simethicone (PHAZYME) 125 MG chewable tablet Chew 250 mg by mouth every 6 (six) hours as needed for flatulence.    . SUMAtriptan (IMITREX) 100 MG tablet Take 1 tablet.  May repeat once in 2 hours if headache persists or recurs. 10 tablet 2  . XIIDRA 5 % SOLN      No current facility-administered medications for this visit.      ALLERGIES: Aspirin; Crestor [rosuvastatin calcium]; Inderal [propranolol]; Other; Relpax [eletriptan hydrobromide]; Salicylates; and Topamax [topiramate]  Family History  Problem Relation Age of Onset  . Migraines Mother   .  Migraines Sister   . Cancer Father        Prostate    Social History   Social History  . Marital status: Married    Spouse name: N/A  . Number of children: N/A  . Years of education: N/A   Occupational History  . LPN    Social History Main Topics  . Smoking status: Never Smoker  . Smokeless tobacco: Never Used  . Alcohol use No  . Drug use: No  . Sexual activity: Yes    Birth control/ protection: Post-menopausal   Other Topics Concern  . Not on file   Social History Narrative   Pt has L.P.N through [facility]    ROS:  Pertinent items are noted in HPI.  PHYSICAL EXAMINATION:    BP 108/70   Pulse 64   Ht 4' 11.5" (1.511 m)   Wt 129 lb (58.5 kg)   LMP 08/07/2001 (Approximate)   BMI 25.62 kg/m     General appearance: alert, cooperative and appears stated age   Pelvic: External genitalia:  no lesions              Urethra:  normal appearing urethra with no masses, tenderness or lesions              Bartholins and Skenes: normal                 Vagina: normal appearing vagina with normal color and discharge, no lesions. Second degree cystocele.  First to second degree uterine prolapse.  Minimal rectocele.  (Patient examined in lithotomy and standing position.)  Pelvic ultrasound: Uterus no masses.  EMS 1.67 mm. Right ovary - atrophic.  Left ovary - cystic mass 1 . 10 mm with internal echoes and 2 mm calcification with shadowing.  Minor change in size vs. Position for measuring.  No abnormal blood flow.  No free fluid.   Chaperone was present for exam.  ASSESSMENT  Incomplete uterovaginal prolapse.  Dysuria.  Recurrent vaginitis with pessary use.  Echogenic left ovary.  Mildly increased in size? Hx LGSIL pap with PCP.  FU pap normal 01/2017.   PLAN  UC.  Remove pessary twice weekly.  She may be developing urethral irritation from removing it too often.  We discussed continued management with pessary versus laparoscopically assisted vaginal hysterectomy with BSO  and anterior colporrhaphy, cystoscopy.  She may consider this but she would like for her daughter to hear more about potential surgery.  The patient thinks she will need assistance at home during her post op recovery and needs family to  understand some of her restrictions.  She will schedule a follow up appointment with this in mind.   An After Visit Summary was printed and given to the patient.  ___25___ minutes face to face time of which over 50% was spent in counseling.

## 2017-03-22 NOTE — Progress Notes (Signed)
Encounter reviewed by Dr. Brook Amundson C. Silva.  

## 2017-03-23 LAB — URINE CULTURE

## 2017-03-28 ENCOUNTER — Encounter: Payer: Self-pay | Admitting: Obstetrics and Gynecology

## 2017-03-29 ENCOUNTER — Telehealth: Payer: Self-pay | Admitting: *Deleted

## 2017-03-29 NOTE — Telephone Encounter (Signed)
I anticipate surgery would be a laparoscopically assisted vaginal hysterectomy with BSO and anterior colporrhaphy, and cystoscopy.

## 2017-03-29 NOTE — Telephone Encounter (Signed)
Call to patient regarding My Chart message from patient. Patient desires to proceed with scheduling surgery as soon as possible. Currently scheduled for consult with daughter on 04-25-17. Wants to know how soon surgery can be scheduled?  Advised MD will advise of surgery instructions and then can take 2--6 weeks depending on patients needs.  Patient anxious to proceed due to chronic yeast and BV. She is also expecting twin grandchildren due in December.  Offered to move consult appointment up to 04-06-17 to discuss surgery with Dr Edward Jolly.  Patient will check with daughter on availability but also wants to confirm this is needed. Just saw Dr Edward Jolly on 03-16-17 and discussed surgery at that time. She is ready to schedule. Please advise.

## 2017-03-30 NOTE — Telephone Encounter (Signed)
This is in follow up to the last communication I sent through.  The procedure I listed was correct and complete.

## 2017-04-02 NOTE — Telephone Encounter (Signed)
Office consult scheduled for 04-06-17. Will initiate precert prior to patient's appointment.   Encounter closed.

## 2017-04-06 ENCOUNTER — Ambulatory Visit (INDEPENDENT_AMBULATORY_CARE_PROVIDER_SITE_OTHER): Payer: Medicare Other | Admitting: Obstetrics and Gynecology

## 2017-04-06 ENCOUNTER — Encounter: Payer: Self-pay | Admitting: Obstetrics and Gynecology

## 2017-04-06 VITALS — BP 110/70 | HR 72 | Resp 16 | Wt 129.0 lb

## 2017-04-06 DIAGNOSIS — N838 Other noninflammatory disorders of ovary, fallopian tube and broad ligament: Secondary | ICD-10-CM

## 2017-04-06 DIAGNOSIS — N839 Noninflammatory disorder of ovary, fallopian tube and broad ligament, unspecified: Secondary | ICD-10-CM

## 2017-04-06 DIAGNOSIS — N812 Incomplete uterovaginal prolapse: Secondary | ICD-10-CM

## 2017-04-06 NOTE — Progress Notes (Signed)
GYNECOLOGY  VISIT   HPI: 66 y.o.   Married  Caucasian  female   G2P1002 with [name]'s last menstrual period was 08/07/2001 (approximate).   here for  Uterovaginal prolapse.   Daughter is present for the visit today.  [name] is nervous about potential surgery.  Second degree cystocele.  First to second degree uterine prolapse.  Minimal rectocele.  ([name] examined in lithotomy and standing position at office visit on 03/22/17.)  Having recurrent vaginal and bladder infections with her pessary use.   Also has echogenic area of there left ovary.  Pelvic US done 03/22/17: Uterus no masses.  EMS 1.67 mm. Right ovary - atrophic.  Left ovary - cystic mass 1 . 10 mm with internal echoes and 2 mm calcification with shadowing.  Minor change in size vs. Position for measuring.  No abnormal blood flow.  No free fluid.   No urinary incontinence with or without pessary.  Had rare leakage of urine with sneeze prior to pessary usage.  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  PCP - Dr. Kevan Ny.  Sees her on Sept. 5.   GYNECOLOGIC HISTORY: [name]'s last menstrual period was 08/07/2001 (approximate). Contraception:  Postmenopausal Menopausal hormone therapy:  Estrace cream with pessary Last mammogram:  07/26/16 Density B/BIRADS1:neg: TBC Last pap smear:   01/17/17 negative - done with Dr. Kevan Ny 07/17/16 CIN1/LSIL. HR HPV:Neg        OB History    Gravida Para Term Preterm AB Living   2 2 1     2    SAB TAB Ectopic Multiple Live Births           2         [name] Active Problem List   Diagnosis Date Noted  . Dry eye syndrome 03/17/2016  . Headache 03/17/2016  . Hyperlipidemia 03/17/2016  . Lack of bladder control 03/17/2016  . Migraines 03/17/2016  . 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 Abnormal Pap smear of cervix 07/2016   CIN1   . Constipation   . Depression   . Diverticulosis   . H. pylori infection   . History of blood transfusion 1984  . Hx of colonic polyps   . Hypercholesteremia   . Hyponatremia   . Migraines   . Vitamin D deficiency     Past Surgical History:  Procedure Laterality Date  . CESAREAN SECTION  1984  . COLONOSCOPY  2012  . COSMETIC SURGERY     Neck lift  . HEMORRHOID SURGERY  1984  . RECTAL PROLAPSE REPAIR     Dr. Kendrick Ranch    Current Outpatient Prescriptions  Medication Sig Dispense Refill  . acetaminophen (TYLENOL) 500 MG tablet Take 500 mg by mouth every 6 (six) hours as needed for mild pain.    . Calcium Carb-Cholecalciferol (CALCIUM 600 + D PO) Take 1 tablet by mouth daily.    . chlordiazePOXIDE (LIBRIUM) 10 MG capsule Take 2 capsules by mouth daily.    . divalproex (DEPAKOTE ER) 250 MG 24 hr tablet Take 2 tablets (500 mg total) by mouth at bedtime. 60 tablet 5  . docusate sodium (COLACE) 100 MG capsule Take by mouth.    . estradiol (ESTRACE) 0.1 MG/GM vaginal cream Use 1/2 g vaginally two times per week. 42.5 g 1  . lansoprazole (PREVACID) 30 MG capsule Take 30 mg by mouth 2 (two) times  daily before a meal.     . Nutritional Supplements (VITAMIN D MAINTENANCE PO) Take 10,000 Units by mouth daily.    [name]. nystatin-triamcinolone (MYCOLOG II) cream Apply 1 application topically 2 (two) times daily. Apply to affected area BID for up to 7 days. 60 g 0  . Omega-3 Fatty Acids (FISH OIL) 1000 MG CAPS Take 1,000 mg by mouth daily.     . polyethylene glycol (MIRALAX / GLYCOLAX) packet Take 17 g by mouth daily as needed (constipation).    . simethicone (PHAZYME) 125 MG chewable tablet Chew 250 mg by mouth every 6 (six) hours as needed for flatulence.    . SUMAtriptan (IMITREX) 100 MG tablet Take 1 tablet.  May repeat once in 2 hours if headache persists or recurs. 10 tablet 2  . XIIDRA 5 % SOLN      No current facility-administered medications for this visit.      ALLERGIES: Aspirin;  Crestor [rosuvastatin calcium]; Inderal [propranolol]; Other; Relpax [eletriptan hydrobromide]; Salicylates; and Topamax [topiramate]  Family History  Problem Relation Age of Onset  . Migraines Mother   . Migraines Sister   . Cancer Father        Prostate    Social History   Social History  . Marital status: Married    Spouse name: N/A  . Number of children: N/A  . Years of education: N/A   Occupational History  . LPN    Social History Main Topics  . Smoking status: Never Smoker  . Smokeless tobacco: Never Used  . Alcohol use No  . Drug use: No  . Sexual activity: Yes    Birth control/ protection: Post-menopausal   Other Topics Concern  . Not on file   Social History Narrative   Pt has L.P.N through [facility]    ROS:  Pertinent items are noted in HPI.  PHYSICAL EXAMINATION:    BP 110/70 (BP Location: Right Arm, [name] Position: Sitting, Cuff Size: Normal)   Pulse 72   Resp 16   Wt 129 lb (58.5 kg)   LMP 08/07/2001 (Approximate)   BMI 25.62 kg/m     General appearance: alert, cooperative and appears stated age   ASSESSMENT  Incomplete uterovaginal prolapse.  Dysuria.  Recurrent vaginitis with pessary use.  Echogenic left ovary.  Mildly increased in size? Hx LGSIL pap with PCP.  FU pap normal 01/2017.  Umbilical surgery as a child.   PLAN Discussed laparoscopically assisted vaginal hysterectomy with BSO, collection of pelvic washings, anterior and possible posterior colporrhaphy, cystoscopy. We discussed benefits, risks, and alternatives to surgery.  Risks include but are not limited to bleeding, infection, damage to surrounding organs, reaction to anesthesia, DVT, PE, death, reoperation, and risk of recurrence or prolapse up to 30%.   Surgical expectations and recovery discussed.  Will have medical clearance from Dr. Kevan NyGates.  AGOG HO on prolapse and surgery for prolapse to [name] and her daughter.   An After Visit Summary was printed and given to the  [name].  __25____ minutes face to face time of which over 50% was spent in counseling.

## 2017-04-06 NOTE — Patient Instructions (Signed)
Kennon RoundsSally will call to discuss potential surgery dates.

## 2017-04-11 ENCOUNTER — Telehealth: Payer: Self-pay | Admitting: *Deleted

## 2017-04-11 NOTE — Telephone Encounter (Signed)
Call back from patient. Desires to proceed with surgery on 04-24-17. Surgery instructions reviewed and printed copy will be mailed to patient. Has appointment with PCP, Dr Kevan NyGates today.  Routing to provider for final review. Patient agreeable to disposition. Will close encounter.   Routing to Dr Hyacinth MeekerMiller, covering while Dr Edward JollySilva out of office.  Dr Hyacinth MeekerMiller will also be assisting with case.

## 2017-04-11 NOTE — Telephone Encounter (Signed)
Call to patient to discuss surgery date options. Patient thinks she would like to proceed with first available date option of 04-24-17. Will discuss with family and call back with final decision.  Requests to speak with business office regarding insurance questions.

## 2017-04-11 NOTE — Telephone Encounter (Signed)
Fax note from PCP, Dr Shaune Pollackonna Gates, stating patient medically stable for surgery. [facility] after you review.

## 2017-04-13 NOTE — Patient Instructions (Signed)
Your procedure is scheduled on:  Tuesday, Sept. 18, 2018  [facility] at:  6:00 AM  Pick up the phone at the desk and dial [phone].  Call this number if you have problems the morning of surgery: [phone].  Remember: Do NOT eat food or drink after:  Midnight Monday  Take these medicines the morning of surgery with a SIP OF WATER:  None  Stop ALL herbal medications and Omega 3 (Fish oil) at this time  Do NOT smoke the day of surgery.  Do NOT wear jewelry (body piercing), metal hair clips/bobby pins, make-up, artifical eyelashes or nail polish. Do NOT wear lotions, powders, or perfumes.  You may wear deodorant. Do NOT shave for 48 hours prior to surgery. Do NOT bring valuables to the hospital. Contacts, dentures, or bridgework may not be worn into surgery.  Leave suitcase in car.  After surgery it may be brought to your room.  For patients admitted to the hospital, checkout time is 11:00 AM the day of discharge.  Bring a copy of your healthcare power of attorney and living will documents.

## 2017-04-16 ENCOUNTER — Other Ambulatory Visit: Payer: Self-pay | Admitting: Obstetrics & Gynecology

## 2017-04-16 ENCOUNTER — Encounter (HOSPITAL_COMMUNITY)
Admission: RE | Admit: 2017-04-16 | Discharge: 2017-04-16 | Disposition: A | Discharge status: Home / Self Care | Payer: Medicare Other | Source: Ambulatory Visit | Attending: Obstetrics and Gynecology | Admitting: Obstetrics and Gynecology

## 2017-04-16 ENCOUNTER — Encounter (HOSPITAL_COMMUNITY): Payer: Self-pay

## 2017-04-16 DIAGNOSIS — E785 Hyperlipidemia, unspecified: Secondary | ICD-10-CM | POA: Insufficient documentation

## 2017-04-16 DIAGNOSIS — R32 Unspecified urinary incontinence: Secondary | ICD-10-CM | POA: Diagnosis not present

## 2017-04-16 DIAGNOSIS — H16223 Keratoconjunctivitis sicca, not specified as Sjogren's, bilateral: Secondary | ICD-10-CM | POA: Insufficient documentation

## 2017-04-16 DIAGNOSIS — E7800 Pure hypercholesterolemia, unspecified: Secondary | ICD-10-CM | POA: Diagnosis not present

## 2017-04-16 DIAGNOSIS — H04129 Dry eye syndrome of unspecified lacrimal gland: Secondary | ICD-10-CM | POA: Diagnosis not present

## 2017-04-16 DIAGNOSIS — E871 Hypo-osmolality and hyponatremia: Secondary | ICD-10-CM | POA: Diagnosis not present

## 2017-04-16 DIAGNOSIS — E559 Vitamin D deficiency, unspecified: Secondary | ICD-10-CM | POA: Diagnosis not present

## 2017-04-16 DIAGNOSIS — G43909 Migraine, unspecified, not intractable, without status migrainosus: Secondary | ICD-10-CM | POA: Insufficient documentation

## 2017-04-16 DIAGNOSIS — Z01812 Encounter for preprocedural laboratory examination: Secondary | ICD-10-CM | POA: Diagnosis not present

## 2017-04-16 DIAGNOSIS — R51 Headache: Secondary | ICD-10-CM | POA: Diagnosis not present

## 2017-04-16 LAB — COMPREHENSIVE METABOLIC PANEL
ALBUMIN: 4.1 g/dL (ref 3.5–5.0)
ALK PHOS: 39 U/L (ref 38–126)
ALT: 11 U/L — AB (ref 14–54)
AST: 15 U/L (ref 15–41)
Anion gap: 8 (ref 5–15)
BILIRUBIN TOTAL: 0.5 mg/dL (ref 0.3–1.2)
BUN: 15 mg/dL (ref 6–20)
CALCIUM: 9.3 mg/dL (ref 8.9–10.3)
CO2: 25 mmol/L (ref 22–32)
CREATININE: 0.52 mg/dL (ref 0.44–1.00)
Chloride: 95 mmol/L — ABNORMAL LOW (ref 101–111)
GFR calc Af Amer: >60 mL/min (ref >60)
GFR calc non Af Amer: >60 mL/min (ref >60)
GLUCOSE: 99 mg/dL (ref 65–99)
POTASSIUM: 4.1 mmol/L (ref 3.5–5.1)
Sodium: 128 mmol/L — ABNORMAL LOW (ref 135–145)
TOTAL PROTEIN: 6.7 g/dL (ref 6.5–8.1)

## 2017-04-16 LAB — CBC
HEMATOCRIT: 38.2 % (ref 36.0–46.0)
Hemoglobin: 13.5 g/dL (ref 12.0–15.0)
MCH: 32.7 pg (ref 26.0–34.0)
MCHC: 35.3 g/dL (ref 30.0–36.0)
MCV: 92.5 fL (ref 78.0–100.0)
Platelets: 186 10*3/uL (ref 150–400)
RBC: 4.13 MIL/uL (ref 3.87–5.11)
RDW: 12 % (ref 11.5–15.5)
WBC: 3.9 10*3/uL — ABNORMAL LOW (ref 4.0–10.5)

## 2017-04-16 LAB — TYPE AND SCREEN
ABO/RH(D): O NEG
Antibody Screen: NEGATIVE

## 2017-04-16 LAB — ABO/RH: ABO/RH(D): O NEG

## 2017-04-18 NOTE — Progress Notes (Signed)
GYNECOLOGY  VISIT   HPI: 66 y.o.   Widowed  Caucasian  female   G2P1002 with Patient's last menstrual period was 08/07/2001 (approximate).   here for surgical consult.    Desires surgery for pelvic organ prolapse. Had a trial of a pessary and developed recurrent vaginal infections. Takes an stool softener and Miralax nightly to keep bowel movements regular. Hx hemorrhoid surgery. No urinary incontinence with pessary use. Had rare leak of urine with sneeze prior to pessary usage.   Has known echogenic area of the left ovary 16 x 10 mm with internal echoes and 2 calcifications.  No abnormal blood flow.  Minor increase in size since prior US.  No free fluid.  Normal right ovary.  Uterus normal with EMS 1.67 mm.  CA125 = 9 and CEA = 0.9 on 01/25/17.  Hx transfusion 8 years ago due to bleeding ulcer.  Positive H Pylori.  No problems since then.  Does not take NSAIDs.  GYNECOLOGIC HISTORY: Patient's last menstrual period was 08/07/2001 (approximate). Contraception:  Postmenopausal Menopausal hormone therapy:  Estrace cream with pessary Last mammogram:07/26/16 Density B/BIRADS1:neg: TBC  Last pap smear:  01/17/17 negative - done with Dr. Kevan NyGates 07/17/16 CIN1/LSIL. HR HPV:Neg        OB History    Gravida Para Term Preterm AB Living   2 2 1     2    SAB TAB Ectopic Multiple Live Births           2         Patient Active Problem List   Diagnosis Date Noted  . Dry eye syndrome 03/17/2016  . Headache 03/17/2016  . Hyperlipidemia 03/17/2016  . Lack of bladder control 03/17/2016  . Migraines 03/17/2016  . 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 Abnormal Pap smear of cervix 07/2016   CIN1  . Constipation   . Depression   . Diverticulosis   . H. pylori infection   . History of blood transfusion 1984  . Hx of colonic polyps   . Hypercholesteremia   . Hyponatremia   .  Migraines   . Vitamin D deficiency     Past Surgical History:  Procedure Laterality Date  . CESAREAN SECTION  1984  . COLONOSCOPY  2012  . COSMETIC SURGERY     Neck lift  . HEMORRHOID SURGERY  1984  . RECTAL PROLAPSE REPAIR     Dr. Kendrick Ranchim [place]  . UMBILICAL HERNIA REPAIR     had surgery as a child.    Current Outpatient Prescriptions  Medication Sig Dispense Refill  . acetaminophen (TYLENOL) 500 MG tablet Take 500 mg by mouth every 6 (six) hours as needed for mild pain or headache.     . chlordiazePOXIDE (LIBRIUM) 10 MG capsule Take 20 mg by mouth at bedtime.     . cholecalciferol (VITAMIN D) 1000 units tablet Take 1,000 Units by mouth at bedtime.    . divalproex (DEPAKOTE ER) 250 MG 24 hr tablet Take 2 tablets (500 mg total) by mouth at bedtime. 60 tablet 5  . docusate sodium (COLACE) 100 MG capsule Take 100 mg by mouth at bedtime.     [name]. estradiol (ESTRACE) 0.1 MG/GM vaginal cream Use 1/2 g vaginally two times per week. 42.5 g 1  . lansoprazole (PREVACID) 30 MG capsule Take 30 mg by mouth 2 (two) times daily before a meal.     . nystatin-triamcinolone (  MYCOLOG II) cream Apply 1 application topically 2 (two) times daily. Apply to affected area BID for up to 7 days. 60 g 0  . Omega-3 Fatty Acids (FISH OIL PO) Take 1 capsule by mouth daily.    . polyethylene glycol (MIRALAX / GLYCOLAX) packet Take 17 g by mouth at bedtime.     . Simethicone (PHAZYME MAXIMUM STRENGTH) 250 MG CAPS Take 250 mg by mouth daily as needed (gas).    . SUMAtriptan (IMITREX) 100 MG tablet Take 1 tablet.  May repeat once in 2 hours if headache persists or recurs. (Patient taking differently: Take 100 mg by mouth every 2 (two) hours as needed. Take 1 tablet.  May repeat once in 2 hours if headache persists or recurs.) 10 tablet 2  . XIIDRA 5 % SOLN Place 1 drop into both eyes 2 (two) times daily.      No current facility-administered medications for this visit.    Facility-Administered Medications Ordered in Other  Visits  Medication Dose Route Frequency Provider Last Rate Last Dose  . cefoTEtan in Dextrose 5% (CEFOTAN) IVPB 2 g  2 g Intravenous On Call to OR Jerene Bears, MD         ALLERGIES: Aspirin; Crestor [rosuvastatin calcium]; Inderal [propranolol]; Other; Relpax [eletriptan hydrobromide]; Salicylates; and Topamax [topiramate]  Family History  Problem Relation Age of Onset  . Migraines Mother   . Migraines Sister   . Cancer Father        Prostate    Social History   Social History  . Marital status: Widowed    Spouse name: N/A  . Number of children: N/A  . Years of education: N/A   Occupational History  . LPN    Social History Main Topics  . Smoking status: Never Smoker  . Smokeless tobacco: Never Used  . Alcohol use No  . Drug use: No  . Sexual activity: Yes    Birth control/ protection: Post-menopausal   Other Topics Concern  . Not on file   Social History Narrative   Pt has L.P.N through [facility]    ROS:  Pertinent items are noted in HPI.  PHYSICAL EXAMINATION:    BP 110/76 (BP Location: Right Arm, Patient Position: Sitting, Cuff Size: Normal)   Pulse 72   Resp 16   Wt 127 lb (57.6 kg)   LMP 08/07/2001 (Approximate)   BMI 23.23 kg/m     General appearance: alert, cooperative and appears stated age Head: Normocephalic, without obvious abnormality, atraumatic Neck: no adenopathy, supple, symmetrical, trachea midline and thyroid normal to inspection and palpation Lungs: clear to auscultation bilaterally Heart: regular rate and rhythm Abdomen: soft, non-tender, no masses,  no organomegaly Extremities: extremities normal, atraumatic, no cyanosis or edema Skin: Skin color, texture, turgor normal. No rashes or lesions Lymph nodes: Cervical, supraclavicular, and axillary nodes normal. No abnormal inguinal nodes palpated Neurologic: Grossly normal  Pelvic: External genitalia:  no lesions              Urethra:  normal appearing urethra with no masses, tenderness  or lesions              Bartholins and Skenes: normal                 Vagina: normal appearing vagina with normal color and discharge, no lesions/  First to second degree cystocele, first degree uterine prolapse, first degree rectocele.               Cervix:  no lesions                Bimanual Exam:  Uterus:  normal size, contour, position, consistency, mobility, non-tender              Adnexa: no mass, fullness, tenderness              Rectal exam: Yes.  .  Confirms.              Anus:  normal sphincter tone, no lesions  Chaperone was present for exam.  ASSESSMENT  Incomplete uterovaginal prolapse. Echogenic left ovary.  Hx LGSIL pap with follow up pap normal. Hx Cesarean Section.  Hx umbilical herniorrhaphy. Hx bleeding ulcer.  On Librium for sleep assistance.  Elevated TSH on 02/22/17 on chat review.  Currently off Synthroid. SIADH.   PLAN  We discussed laparoscopically assisted vaginal hysterectomy with bilateral salpingo-oophorectomy, anterior and posterior colporrhaphy, cystoscopy, and collection of pelvic washings.  Risks, benefits, and alternatives reviewed with the patient who wishes to proceed.  Patient does understand up to 30% recurrence rates for prolapse and incontinence following surgery.  Surgical recovery and expectations reviewed. Enema tonight.  No NSAIDs post op. Will take just one Librium (10 mg) at night while taking narcotic.    An After Visit Summary was printed and given to the patient.  _25_____ minutes face to face time of which over 50% was spent in counseling.

## 2017-04-23 ENCOUNTER — Encounter: Payer: Self-pay | Admitting: Obstetrics and Gynecology

## 2017-04-23 ENCOUNTER — Ambulatory Visit (INDEPENDENT_AMBULATORY_CARE_PROVIDER_SITE_OTHER): Payer: Medicare Other | Admitting: Obstetrics and Gynecology

## 2017-04-23 VITALS — BP 110/76 | HR 72 | Resp 16 | Wt 127.0 lb

## 2017-04-23 DIAGNOSIS — N812 Incomplete uterovaginal prolapse: Secondary | ICD-10-CM

## 2017-04-24 ENCOUNTER — Encounter (HOSPITAL_COMMUNITY)
Admission: AD | Disposition: A | Discharge status: Home / Self Care | Payer: Self-pay | Source: Ambulatory Visit | Attending: Obstetrics and Gynecology

## 2017-04-24 ENCOUNTER — Encounter (HOSPITAL_COMMUNITY): Payer: Self-pay | Admitting: *Deleted

## 2017-04-24 ENCOUNTER — Encounter: Payer: Self-pay | Admitting: Obstetrics and Gynecology

## 2017-04-24 ENCOUNTER — Observation Stay (HOSPITAL_COMMUNITY)
Admission: AD | Admit: 2017-04-24 | Discharge: 2017-04-25 | Disposition: A | Discharge status: Home / Self Care | Payer: Medicare Other | Source: Ambulatory Visit | Attending: Obstetrics and Gynecology | Admitting: Obstetrics and Gynecology

## 2017-04-24 ENCOUNTER — Ambulatory Visit (HOSPITAL_COMMUNITY): Payer: Medicare Other | Admitting: Anesthesiology

## 2017-04-24 DIAGNOSIS — Z79899 Other long term (current) drug therapy: Secondary | ICD-10-CM | POA: Diagnosis not present

## 2017-04-24 DIAGNOSIS — N7011 Chronic salpingitis: Secondary | ICD-10-CM | POA: Diagnosis not present

## 2017-04-24 DIAGNOSIS — N812 Incomplete uterovaginal prolapse: Secondary | ICD-10-CM | POA: Diagnosis not present

## 2017-04-24 DIAGNOSIS — N736 Female pelvic peritoneal adhesions (postinfective): Secondary | ICD-10-CM

## 2017-04-24 DIAGNOSIS — E7800 Pure hypercholesterolemia, unspecified: Secondary | ICD-10-CM | POA: Diagnosis not present

## 2017-04-24 DIAGNOSIS — Z90710 Acquired absence of both cervix and uterus: Secondary | ICD-10-CM | POA: Diagnosis present

## 2017-04-24 DIAGNOSIS — Z886 Allergy status to analgesic agent status: Secondary | ICD-10-CM | POA: Diagnosis not present

## 2017-04-24 DIAGNOSIS — E222 Syndrome of inappropriate secretion of antidiuretic hormone: Secondary | ICD-10-CM | POA: Diagnosis not present

## 2017-04-24 DIAGNOSIS — N839 Noninflammatory disorder of ovary, fallopian tube and broad ligament, unspecified: Secondary | ICD-10-CM | POA: Diagnosis not present

## 2017-04-24 DIAGNOSIS — F329 Major depressive disorder, single episode, unspecified: Secondary | ICD-10-CM | POA: Diagnosis not present

## 2017-04-24 DIAGNOSIS — E785 Hyperlipidemia, unspecified: Secondary | ICD-10-CM | POA: Diagnosis not present

## 2017-04-24 DIAGNOSIS — E559 Vitamin D deficiency, unspecified: Secondary | ICD-10-CM | POA: Diagnosis not present

## 2017-04-24 DIAGNOSIS — Z8042 Family history of malignant neoplasm of prostate: Secondary | ICD-10-CM | POA: Insufficient documentation

## 2017-04-24 DIAGNOSIS — Z888 Allergy status to other drugs, medicaments and biological substances status: Secondary | ICD-10-CM | POA: Diagnosis not present

## 2017-04-24 DIAGNOSIS — G43909 Migraine, unspecified, not intractable, without status migrainosus: Secondary | ICD-10-CM | POA: Diagnosis not present

## 2017-04-24 HISTORY — PX: CYSTOSCOPY: SHX5120

## 2017-04-24 HISTORY — PX: LAPAROSCOPIC VAGINAL HYSTERECTOMY WITH SALPINGO OOPHORECTOMY: SHX6681

## 2017-04-24 HISTORY — PX: ANTERIOR AND POSTERIOR REPAIR: SHX5121

## 2017-04-24 HISTORY — PX: LAPAROSCOPIC LYSIS OF ADHESIONS: SHX5905

## 2017-04-24 SURGERY — HYSTERECTOMY, VAGINAL, LAPAROSCOPY-ASSISTED, WITH SALPINGO-OOPHORECTOMY
Anesthesia: General | Site: Abdomen

## 2017-04-24 MED ORDER — POLYVINYL ALCOHOL 1.4 % OP SOLN
1.0000 [drp] | Freq: Two times a day (BID) | OPHTHALMIC | Status: DC | PRN
  Filled 2017-04-24: qty 15

## 2017-04-24 MED ORDER — SIMETHICONE 80 MG PO CHEW: 80.0000 mg | CHEWABLE_TABLET | Freq: Every day | ORAL | Status: DC | PRN

## 2017-04-24 MED ORDER — GLYCOPYRROLATE 0.2 MG/ML IJ SOLN
INTRAMUSCULAR | Status: DC | PRN
  Administered 2017-04-24: 0.1 mg via INTRAVENOUS

## 2017-04-24 MED ORDER — PROMETHAZINE HCL 25 MG/ML IJ SOLN: 6.2500 mg | INTRAMUSCULAR | Status: DC | PRN

## 2017-04-24 MED ORDER — ACETAMINOPHEN 500 MG PO TABS
500.0000 mg | ORAL_TABLET | Freq: Four times a day (QID) | ORAL | Status: DC | PRN
  Administered 2017-04-25: 500 mg via ORAL
  Filled 2017-04-24: qty 1

## 2017-04-24 MED ORDER — SUGAMMADEX SODIUM 200 MG/2ML IV SOLN
INTRAVENOUS | Status: DC | PRN
  Administered 2017-04-24: 120 mg via INTRAVENOUS

## 2017-04-24 MED ORDER — ROCURONIUM BROMIDE 100 MG/10ML IV SOLN
INTRAVENOUS | Status: DC | PRN
  Administered 2017-04-24: 10 mg via INTRAVENOUS
  Administered 2017-04-24: 20 mg via INTRAVENOUS
  Administered 2017-04-24: 10 mg via INTRAVENOUS
  Administered 2017-04-24: 40 mg via INTRAVENOUS
  Administered 2017-04-24 (×2): 10 mg via INTRAVENOUS

## 2017-04-24 MED ORDER — MENTHOL 3 MG MT LOZG: 1.0000 | LOZENGE | OROMUCOSAL | Status: DC | PRN

## 2017-04-24 MED ORDER — LACTATED RINGERS IV SOLN
INTRAVENOUS | Status: DC
  Administered 2017-04-24 – 2017-04-25 (×2): via INTRAVENOUS

## 2017-04-24 MED ORDER — DIVALPROEX SODIUM ER 500 MG PO TB24
500.0000 mg | ORAL_TABLET | Freq: Every day | ORAL | Status: DC
  Administered 2017-04-24: 500 mg via ORAL
  Filled 2017-04-24 (×2): qty 1

## 2017-04-24 MED ORDER — ONDANSETRON HCL 4 MG/2ML IJ SOLN: 4.0000 mg | Freq: Four times a day (QID) | INTRAMUSCULAR | Status: DC | PRN

## 2017-04-24 MED ORDER — DIPHENHYDRAMINE HCL 12.5 MG/5ML PO ELIX: 12.5000 mg | ORAL_SOLUTION | Freq: Four times a day (QID) | ORAL | Status: DC | PRN

## 2017-04-24 MED ORDER — CEFOTETAN DISODIUM-DEXTROSE 2-2.08 GM-% IV SOLR
INTRAVENOUS | Status: AC
  Filled 2017-04-24: qty 50

## 2017-04-24 MED ORDER — MIDAZOLAM HCL 2 MG/2ML IJ SOLN
INTRAMUSCULAR | Status: AC
  Filled 2017-04-24: qty 2

## 2017-04-24 MED ORDER — PANTOPRAZOLE SODIUM 40 MG PO TBEC
40.0000 mg | DELAYED_RELEASE_TABLET | Freq: Every day | ORAL | Status: DC
  Administered 2017-04-25: 40 mg via ORAL

## 2017-04-24 MED ORDER — SUMATRIPTAN SUCCINATE 100 MG PO TABS
100.0000 mg | ORAL_TABLET | Freq: Once | ORAL | Status: DC | PRN
  Filled 2017-04-24: qty 1

## 2017-04-24 MED ORDER — CHLORDIAZEPOXIDE HCL 5 MG PO CAPS
10.0000 mg | ORAL_CAPSULE | Freq: Every day | ORAL | Status: DC
  Administered 2017-04-24: 10 mg via ORAL
  Filled 2017-04-24 (×2): qty 2

## 2017-04-24 MED ORDER — BUPIVACAINE HCL (PF) 0.25 % IJ SOLN
INTRAMUSCULAR | Status: AC
  Filled 2017-04-24: qty 30

## 2017-04-24 MED ORDER — OXYCODONE-ACETAMINOPHEN 5-325 MG PO TABS: 1.0000 | ORAL_TABLET | ORAL | Status: DC | PRN

## 2017-04-24 MED ORDER — FENTANYL CITRATE (PF) 100 MCG/2ML IJ SOLN
INTRAMUSCULAR | Status: DC | PRN
  Administered 2017-04-24 (×2): 50 ug via INTRAVENOUS
  Administered 2017-04-24: 25 ug via INTRAVENOUS
  Administered 2017-04-24: 50 ug via INTRAVENOUS
  Administered 2017-04-24: 25 ug via INTRAVENOUS

## 2017-04-24 MED ORDER — DEXAMETHASONE SODIUM PHOSPHATE 10 MG/ML IJ SOLN
INTRAMUSCULAR | Status: AC
  Filled 2017-04-24: qty 1

## 2017-04-24 MED ORDER — PHENYLEPHRINE 40 MCG/ML (10ML) SYRINGE FOR IV PUSH (FOR BLOOD PRESSURE SUPPORT)
PREFILLED_SYRINGE | INTRAVENOUS | Status: AC
  Filled 2017-04-24: qty 10

## 2017-04-24 MED ORDER — SODIUM CHLORIDE 0.9 % IJ SOLN
INTRAMUSCULAR | Status: AC
  Filled 2017-04-24: qty 10

## 2017-04-24 MED ORDER — LIFITEGRAST 5 % OP SOLN: 1.0000 [drp] | Freq: Two times a day (BID) | OPHTHALMIC | Status: DC

## 2017-04-24 MED ORDER — SUGAMMADEX SODIUM 200 MG/2ML IV SOLN
INTRAVENOUS | Status: AC
  Filled 2017-04-24: qty 2

## 2017-04-24 MED ORDER — ESTRADIOL 0.1 MG/GM VA CREA
TOPICAL_CREAM | VAGINAL | Status: AC
  Filled 2017-04-24: qty 42.5

## 2017-04-24 MED ORDER — ACETAMINOPHEN 10 MG/ML IV SOLN: 1000.0000 mg | Freq: Once | INTRAVENOUS | Status: DC | PRN

## 2017-04-24 MED ORDER — DIPHENHYDRAMINE HCL 50 MG/ML IJ SOLN: 12.5000 mg | Freq: Four times a day (QID) | INTRAMUSCULAR | Status: DC | PRN

## 2017-04-24 MED ORDER — ONDANSETRON HCL 4 MG PO TABS: 4.0000 mg | ORAL_TABLET | Freq: Four times a day (QID) | ORAL | Status: DC | PRN

## 2017-04-24 MED ORDER — LIDOCAINE-EPINEPHRINE 1 %-1:100000 IJ SOLN
INTRAMUSCULAR | Status: AC
  Filled 2017-04-24: qty 1

## 2017-04-24 MED ORDER — GLYCOPYRROLATE 0.2 MG/ML IJ SOLN
INTRAMUSCULAR | Status: AC
  Filled 2017-04-24: qty 1

## 2017-04-24 MED ORDER — LIDOCAINE HCL (CARDIAC) 20 MG/ML IV SOLN
INTRAVENOUS | Status: AC
  Filled 2017-04-24: qty 5

## 2017-04-24 MED ORDER — EPHEDRINE 5 MG/ML INJ
INTRAVENOUS | Status: AC
  Filled 2017-04-24: qty 20

## 2017-04-24 MED ORDER — MORPHINE SULFATE 2 MG/ML IV SOLN
INTRAVENOUS | Status: DC
  Administered 2017-04-24: 1 mg via INTRAVENOUS
  Administered 2017-04-24: 3 mg via INTRAVENOUS
  Administered 2017-04-24: 16:00:00 via INTRAVENOUS
  Administered 2017-04-24: 1.5 mL via INTRAVENOUS
  Administered 2017-04-25: 2 mg via INTRAVENOUS
  Administered 2017-04-25: 1 mg via INTRAVENOUS
  Filled 2017-04-24: qty 30

## 2017-04-24 MED ORDER — NALOXONE HCL 0.4 MG/ML IJ SOLN: 0.4000 mg | INTRAMUSCULAR | Status: DC | PRN

## 2017-04-24 MED ORDER — ACETAMINOPHEN 10 MG/ML IV SOLN
INTRAVENOUS | Status: AC
  Filled 2017-04-24: qty 100

## 2017-04-24 MED ORDER — DEXAMETHASONE SODIUM PHOSPHATE 10 MG/ML IJ SOLN
INTRAMUSCULAR | Status: DC | PRN
  Administered 2017-04-24: 8 mg via INTRAVENOUS

## 2017-04-24 MED ORDER — LIDOCAINE HCL (CARDIAC) 20 MG/ML IV SOLN
INTRAVENOUS | Status: DC | PRN
  Administered 2017-04-24: 50 mg via INTRAVENOUS

## 2017-04-24 MED ORDER — FENTANYL CITRATE (PF) 250 MCG/5ML IJ SOLN
INTRAMUSCULAR | Status: AC
  Filled 2017-04-24: qty 5

## 2017-04-24 MED ORDER — MIDAZOLAM HCL 2 MG/2ML IJ SOLN
INTRAMUSCULAR | Status: DC | PRN
  Administered 2017-04-24 (×2): 1 mg via INTRAVENOUS

## 2017-04-24 MED ORDER — ONDANSETRON HCL 4 MG/2ML IJ SOLN
INTRAMUSCULAR | Status: DC | PRN
  Administered 2017-04-24: 4 mg via INTRAVENOUS

## 2017-04-24 MED ORDER — PROPOFOL 10 MG/ML IV BOLUS
INTRAVENOUS | Status: DC | PRN
  Administered 2017-04-24: 120 mg via INTRAVENOUS

## 2017-04-24 MED ORDER — PROPOFOL 10 MG/ML IV BOLUS
INTRAVENOUS | Status: AC
  Filled 2017-04-24: qty 20

## 2017-04-24 MED ORDER — CEFOTETAN DISODIUM-DEXTROSE 2-2.08 GM-% IV SOLR
2.0000 g | INTRAVENOUS | Status: AC
  Administered 2017-04-24: 2 g via INTRAVENOUS

## 2017-04-24 MED ORDER — ENOXAPARIN SODIUM 40 MG/0.4ML ~~LOC~~ SOLN
40.0000 mg | SUBCUTANEOUS | Status: DC
  Administered 2017-04-24: 40 mg via SUBCUTANEOUS
  Filled 2017-04-24: qty 0.4

## 2017-04-24 MED ORDER — ARTIFICIAL TEARS OPHTHALMIC OINT
TOPICAL_OINTMENT | OPHTHALMIC | Status: AC
  Filled 2017-04-24: qty 3.5

## 2017-04-24 MED ORDER — SODIUM CHLORIDE 0.9 % IR SOLN
Status: DC | PRN
  Administered 2017-04-24: 3000 mL

## 2017-04-24 MED ORDER — EPHEDRINE SULFATE 50 MG/ML IJ SOLN
INTRAMUSCULAR | Status: DC | PRN
  Administered 2017-04-24 (×2): 10 mg via INTRAVENOUS

## 2017-04-24 MED ORDER — LACTATED RINGERS IV SOLN
INTRAVENOUS | Status: DC
  Administered 2017-04-24 (×3): via INTRAVENOUS

## 2017-04-24 MED ORDER — LIDOCAINE-EPINEPHRINE 1 %-1:100000 IJ SOLN
INTRAMUSCULAR | Status: DC | PRN
  Administered 2017-04-24: 19 mL

## 2017-04-24 MED ORDER — MEPERIDINE HCL 25 MG/ML IJ SOLN: 6.2500 mg | INTRAMUSCULAR | Status: DC | PRN

## 2017-04-24 MED ORDER — ROCURONIUM BROMIDE 100 MG/10ML IV SOLN
INTRAVENOUS | Status: AC
Start: 2017-04-24 — End: ?
  Filled 2017-04-24: qty 1

## 2017-04-24 MED ORDER — BUPIVACAINE HCL (PF) 0.25 % IJ SOLN
INTRAMUSCULAR | Status: DC | PRN
  Administered 2017-04-24: 14 mL

## 2017-04-24 MED ORDER — ESTRADIOL 0.1 MG/GM VA CREA
TOPICAL_CREAM | VAGINAL | Status: DC | PRN
  Administered 2017-04-24: 1 via VAGINAL

## 2017-04-24 MED ORDER — ACETAMINOPHEN 10 MG/ML IV SOLN
INTRAVENOUS | Status: DC | PRN
  Administered 2017-04-24: 1000 mg via INTRAVENOUS

## 2017-04-24 MED ORDER — PHENYLEPHRINE HCL 10 MG/ML IJ SOLN
INTRAMUSCULAR | Status: DC | PRN
  Administered 2017-04-24 (×5): .04 mg via INTRAVENOUS

## 2017-04-24 MED ORDER — ONDANSETRON HCL 4 MG/2ML IJ SOLN
INTRAMUSCULAR | Status: AC
  Filled 2017-04-24: qty 2

## 2017-04-24 MED ORDER — HYDROMORPHONE HCL 1 MG/ML IJ SOLN: 0.2500 mg | INTRAMUSCULAR | Status: DC | PRN

## 2017-04-24 MED ORDER — STERILE WATER FOR IRRIGATION IR SOLN
Status: DC | PRN
  Administered 2017-04-24: 1000 mL via INTRAVESICAL

## 2017-04-24 MED ORDER — SODIUM CHLORIDE 0.9% FLUSH: 9.0000 mL | INTRAVENOUS | Status: DC | PRN

## 2017-04-24 SURGICAL SUPPLY — 84 items
APPLICATOR ARISTA FLEXITIP XL (MISCELLANEOUS) ×5 IMPLANT
APPLICATOR COTTON TIP 6IN STRL (MISCELLANEOUS) ×5 IMPLANT
BLADE SURG 11 STRL SS (BLADE) ×5 IMPLANT
BLADE SURG 15 STRL LF C SS BP (BLADE) ×3 IMPLANT
BLADE SURG 15 STRL SS (BLADE) ×2
CABLE HIGH FREQUENCY MONO STRZ (ELECTRODE) ×5 IMPLANT
CANISTER SUCT 3000ML PPV (MISCELLANEOUS) ×5 IMPLANT
CATH FOLEY 2WAY SLVR  5CC 18FR (CATHETERS)
CATH FOLEY 2WAY SLVR 5CC 18FR (CATHETERS) IMPLANT
CLOTH BEACON ORANGE TIMEOUT ST (SAFETY) ×5 IMPLANT
CONT PATH 16OZ SNAP LID 3702 (MISCELLANEOUS) ×5 IMPLANT
COVER BACK TABLE 60X90IN (DRAPES) ×5 IMPLANT
DECANTER SPIKE VIAL GLASS SM (MISCELLANEOUS) ×10 IMPLANT
DERMABOND ADVANCED (GAUZE/BANDAGES/DRESSINGS) ×2
DERMABOND ADVANCED .7 DNX12 (GAUZE/BANDAGES/DRESSINGS) ×3 IMPLANT
DEVICE CAPIO SLIM SINGLE (INSTRUMENTS) IMPLANT
DILATOR CANAL MILEX (MISCELLANEOUS) ×5 IMPLANT
DRSG OPSITE POSTOP 3X4 (GAUZE/BANDAGES/DRESSINGS) IMPLANT
DURAPREP 26ML APPLICATOR (WOUND CARE) ×5 IMPLANT
ELECT REM PT RETURN 9FT ADLT (ELECTROSURGICAL)
ELECTRODE REM PT RTRN 9FT ADLT (ELECTROSURGICAL) IMPLANT
GAUZE PACKING 2X5 YD STRL (GAUZE/BANDAGES/DRESSINGS) ×5 IMPLANT
GLOVE BIO SURGEON STRL SZ 6.5 (GLOVE) ×12 IMPLANT
GLOVE BIO SURGEONS STRL SZ 6.5 (GLOVE) ×3
GLOVE BIOGEL PI IND STRL 6.5 (GLOVE) ×3 IMPLANT
GLOVE BIOGEL PI IND STRL 7.0 (GLOVE) ×9 IMPLANT
GLOVE BIOGEL PI INDICATOR 6.5 (GLOVE) ×2
GLOVE BIOGEL PI INDICATOR 7.0 (GLOVE) ×6
GOWN STRL REUS W/TWL LRG LVL3 (GOWN DISPOSABLE) ×20 IMPLANT
HEMOSTAT ARISTA ABSORB 3G PWDR (MISCELLANEOUS) ×5 IMPLANT
LEGGING LITHOTOMY PAIR STRL (DRAPES) ×10 IMPLANT
LIGASURE VESSEL 5MM BLUNT TIP (ELECTROSURGICAL) IMPLANT
NEEDLE HYPO 22GX1.5 SAFETY (NEEDLE) ×5 IMPLANT
NEEDLE INSUFFLATION 120MM (ENDOMECHANICALS) ×5 IMPLANT
NEEDLE MAYO 6 CRC TAPER PT (NEEDLE) IMPLANT
NS IRRIG 1000ML POUR BTL (IV SOLUTION) ×15 IMPLANT
OCCLUDER COLPOPNEUMO (BALLOONS) IMPLANT
PACK LAVH (CUSTOM PROCEDURE TRAY) ×5 IMPLANT
PACK ROBOTIC GOWN (GOWN DISPOSABLE) ×5 IMPLANT
PACK TRENDGUARD 450 HYBRID PRO (MISCELLANEOUS) ×3 IMPLANT
PACK TRENDGUARD 600 HYBRD PROC (MISCELLANEOUS) IMPLANT
PACK VAGINAL WOMENS (CUSTOM PROCEDURE TRAY) ×5 IMPLANT
PAD MAGNETIC INST (MISCELLANEOUS) ×5 IMPLANT
PLUG CATH AND CAP STER (CATHETERS) ×5 IMPLANT
POUCH LAPAROSCOPIC INSTRUMENT (MISCELLANEOUS) IMPLANT
PROTECTOR NERVE ULNAR (MISCELLANEOUS) ×10 IMPLANT
SCISSORS LAP 5X35 DISP (ENDOMECHANICALS) ×5 IMPLANT
SET CYSTO W/LG BORE CLAMP LF (SET/KITS/TRAYS/PACK) ×5 IMPLANT
SET IRRIG TUBING LAPAROSCOPIC (IRRIGATION / IRRIGATOR) ×10 IMPLANT
SLEEVE ADV FIXATION 5X100MM (TROCAR) ×10 IMPLANT
SLEEVE XCEL OPT CAN 5 100 (ENDOMECHANICALS) ×5 IMPLANT
SPONGE SURGIFOAM ABS GEL 12-7 (HEMOSTASIS) IMPLANT
SURGIFLO W/THROMBIN 8M KIT (HEMOSTASIS) IMPLANT
SUT CAPIO ETHIBPND (SUTURE) IMPLANT
SUT VIC AB 0 CT1 18XCR BRD8 (SUTURE) ×6 IMPLANT
SUT VIC AB 0 CT1 27 (SUTURE) ×6
SUT VIC AB 0 CT1 27XBRD ANBCTR (SUTURE) ×9 IMPLANT
SUT VIC AB 0 CT1 36 (SUTURE) ×5 IMPLANT
SUT VIC AB 0 CT1 8-18 (SUTURE) ×4
SUT VIC AB 2-0 CT1 27 (SUTURE)
SUT VIC AB 2-0 CT1 TAPERPNT 27 (SUTURE) IMPLANT
SUT VIC AB 2-0 CT2 27 (SUTURE) IMPLANT
SUT VIC AB 2-0 SH 27 (SUTURE) ×8
SUT VIC AB 2-0 SH 27XBRD (SUTURE) ×12 IMPLANT
SUT VIC AB 2-0 UR6 27 (SUTURE) IMPLANT
SUT VICRYL 0 TIES 12 18 (SUTURE) ×5 IMPLANT
SUT VICRYL 4-0 PS2 18IN ABS (SUTURE) ×5 IMPLANT
SYR 50ML LL SCALE MARK (SYRINGE) IMPLANT
SYRINGE 10CC LL (SYRINGE) IMPLANT
TIP UTERINE 5.1X6CM LAV DISP (MISCELLANEOUS) ×5 IMPLANT
TIP UTERINE 6.7X10CM GRN DISP (MISCELLANEOUS) IMPLANT
TIP UTERINE 6.7X6CM WHT DISP (MISCELLANEOUS) IMPLANT
TIP UTERINE 6.7X8CM BLUE DISP (MISCELLANEOUS) IMPLANT
TOWEL OR 17X24 6PK STRL BLUE (TOWEL DISPOSABLE) ×10 IMPLANT
TRAY FOLEY CATH SILVER 14FR (SET/KITS/TRAYS/PACK) ×5 IMPLANT
TRAY FOLEY CATH SILVER 16FR (SET/KITS/TRAYS/PACK) ×5 IMPLANT
TRENDGUARD 450 HYBRID PRO PACK (MISCELLANEOUS) ×5
TRENDGUARD 600 HYBRID PROC PK (MISCELLANEOUS)
TROCAR ADV FIXATION 5X100MM (TROCAR) IMPLANT
TROCAR BALLN 12MMX100 BLUNT (TROCAR) ×5 IMPLANT
TROCAR XCEL NON-BLD 5MMX100MML (ENDOMECHANICALS) IMPLANT
TUBING NON-CON 1/4 X 20 CONN (TUBING) ×4 IMPLANT
TUBING NON-CON 1/4 X 20' CONN (TUBING) ×1
WARMER LAPAROSCOPE (MISCELLANEOUS) ×5 IMPLANT

## 2017-04-24 NOTE — Op Note (Signed)
OPERATIVE REPORT   PREOPERATIVE DIAGNOSES:   Incomplete uterovaginal prolapse, echogenic left ovary.  POSTOPERATIVE DIAGNOSES:   Incomplete uterovagainal prolapse, echogenic left ovary, severe pelvic adhesions.   PROCEDURES:  Laparoscopically assisted vaginal hysterectomy with bilateral salpingo-oophhorectomy, anterior and posterior colporrhaphy, cystoscopy, and lysis of adhesions.  SURGEON:  Randye Lobo, M.D.  ASSISTANT:  Annamaria Boots, M.D.  ANESTHESIA:  General endotracheal, local with 1% lidocaine with epinephrine, 1:100,000.  EBL:   150 cc.  URINE OUTPUT:   150 cc.  IV FLUIDS:    2300 cc LR.  COMPLICATIONS:  None.  INDICATIONS FOR THE PROCEDURE:  The patient is a 66 year old para 2 Caucasian female who presents with pelvic organ prolapse and recurrent vaginal infections with pessary use. Pelvic ultrasound shows an echogenic area of the left ovary measuring 10 x 16 mm.  CA125 and CEA levels are normal.The patient desires surgical repair and a plan is made to proceed now with a laparoscopically assisted vaginal hysterectomy, bilateral salpingectomy with pelvic washings, anterior and posterior colporrhaphy and cystoscopy.  Risks, benefits, and alternatives are reviewed with the patient, who wishes to proceed.  FINDINGS:  Examination under anesthesia revealed a second degree cystocele, first degree uterine prolapse, and a minimal rectocele.   The bladder was visualized throughout 360 degrees and was normal.  There  was no foreign body in the bladder or the urethra.  The ureters were noted to  be patent bilaterally.  Laparoscopy revealed dense adhesions of the left adnexa to the left pelvic side wall.  The fallopian tube appeared to be dilated.  The ovary itself was unremarkable.  The sigmoid colon was adherent to the left adnexa as well.  The right tube and ovary contained minor adhesions to the right pelvic side wall, but they were otherwise normal.  The uterus was noted to  be normal.  The appendix, liver, and gall bladder were unremarkable.   SPECIMENS:  Uterus, cervix, bilateral tubes and ovaries were sent to pathology separately from the pelvic washings.  DESCRIPTION OF PROCEDURE:  The patient was reidentified in the preoperative hold area.  She received Cefotetan 2 grams IV for antibiotic prophylaxis.  She received Lovenox and PAS stockings for DVT prophylaxis.  The patient was transferred to the operating room where she was placed in the dorsal lithotomy position with Allen stirrups.  General endotracheal anesthesia was induced.  The Trendgard was used to support the patient on the OR table.  The patient's lower abdomen, vagina and perineum were then sterilely prepped and she was draped.  A Foley catheter was sterilely placed inside the bladder and left to gravity drainage throughout the procedure and at the termination of the procedure.  An examination under anesthesia was performed.  A speculum was placed in the vagina and a single-tooth tenaculum was placed on the anterior cervical lip. This was replaced with a Hulka tenaculum after an os finder was used to dilate the cervical os.  The speculum was removed.  A Foley catheter was placed inside the bladder and left to gravity drainage.   Attention was turned to the abdomen where the left upper quadrant trocar insertion was performed.  The skin was injected with 0.25% Marcaine, and the skin was incised with the scalpel.  The 5 mm Optiview trocar was used for entry into the abdominal cavity.  A CO2 pneumoperitoneum was achieved.  5 mm incisions were then created in the left lower abdomen and the right lower abdomen after the skin was injected  locally with 0.25% Marcaine.  The 5 mm trocars were then placed under visualization of the laparoscope.     At this time, the patient was placed in Trendelenburg position. An inspection of the abdomen and pelvis was performed. The findings are as noted above.   Pelvic  washings were collected and sent to Pathology.   Dense adhesions were noted between the left adnexa and the left pelvic side wall.  The sigmoid colon was also adherent to the left tube and ovary.  A 5 mm umbilical incision was created after injection of the umbilicus with 0.25% Marcaine, and then a 5 mm trocar was placed under direction vision of the laparoscope.  The adhesions of the left adnexa were lysed with sharp dissection with a laparoscopic scissors and required 1.5 hours of time to restore normal anatomy and properly dissect out the left ureter. The left uterine artery at the level of the ureter was dissected out as well and 2 small perforating vessels of the left uterine artery bled slightly.  These were eventually cauterized with the Ligasure for good hemostasis after the uterine manipulator was converted over the a 6 cm RUMI with small KOH ring for definition of the anatomy.  The left infundibulopelvic ligament was grasped and the Ligasure was used to cauterize and cut the pedicle.  The left round ligament was then cauterized and divided with same instrument. Dissection was performed to the anterior and posterior leaves of the broad ligaments using the monopolar laparoscopic scissors. The incision was carried across the anterior cul- de-sac along the vesicouterine fold and the bladder was dissected away from the cervix using the laparoscopic scissors. The peritoneum was taken down posteriorly. The left uterine artery was skeletonized at this time using sharp dissection and monopolar cautery.   It was then cauterized and cut with the Ligasure instrument.  Attention was turned to the patient's right-hand side at this time. The same procedure that was performed on the left side was repeated on the right side with respect to the isolation, cautery, and transection of the vessels and the bladder flap dissection.  Minor adhesions of the right adnexa to the right pelvic side wall was performed with  the Ligasure instrument.   Hemostasis was good and the remained of the procedure continued vaginally with the exception of final laparoscopy to confirm hemostasis at the end of the surgery.  The laparoscopic ports were left in place and the instruments were removed from the peritoneal cavity.  A weight speculum was placed in the vagina. A Jacob's tenaculum was placed on the anterior cervical lip. The cervix was injected locally with 1% lidocaine with epinephrine, 1:100,000.  The cervix was circumscribed with a scalpel and a Mayo scissors was used to dissect into the endocervical fascia around the cervix.  The posterior cul de sac was entered sharply with the Mayo scissors.  The posterior vaginal cuff was tagged with a suture of 0/0 Vicryl.  The long weighted speculum was placed in the posterior cul de sac.  The uterosacral ligaments were then clamped with Haney clamps, sharply divided, and suture ligated with transfixing sutures of 0/0 Vicryl. The anterior cul de sac was entered sharply with a Metzenbaum scissors, and a Dever retractor was placed in the anterior cul de sac. The remaining cardinal ligaments were clamped with a Haney clamp, sharply divided, and suture ligated with 0/0 Vicryl.  The specimen was free from all pedicles and was sent to Pathology.   The posterior vaginal cuff was  whip stitched with 0/0 Vicryl.  A McCall's culdoplasty was performed with 0/0 Vicryl.  The suture was brought from the vaginal cuff at 6:00 to inside the cul de sac.  It was brought through the uterosacral ligament, across the posterior cul de sac, down through the right uterosacral ligament, and the in the cul de sac and back out at 6:00.  The suture was held and tied at the end of the vaginal procedure, and there was good elevation and support of the vaginal cuff.  Allis clamps were used to mark the anterior vaginal wall from 1 cm below the urethra to the vaginal apex.  The anterior vaginal wall mucosa was injected  locally with 1% lidocaine with epinephrine, 1:100,000.  The vaginal mucosa was then incised vertically in the midline with a Metzenbaum scissors.  With a combination of sharp and blunt dissection, the subvaginal tissue was dissected off the bladder bilaterally.  The dissection was carried back to the urethrovesical junction. The anterior colporrhaphy was performed with vertical mattress sutures.  2/0 Vicryl was used at the urethrovesical junction, and 0 Vicryl was used for reduction of the cystocele. Hemostasis was good.  The anterior vaginal wall mucosa was trimmed and then the anterior vaginal wall was closed with a running locked suture of 2-0 Vicryl.  The posterior colporrhaphy was performed next.  Allis clamps were used to mark the perineal body and then the posterior vaginal mucosa up to the level of the vaginal apex.  The perineal body and mucosa were injected locally with 1% lidocaine with epinephrine, 1:100,000.  A triangular wedge of epithelium was excised from the perineal body and the posterior mucosa was incised vertically in the midline with a Metzenbaum scissors.  Sharp and blunt dissection were used to dissect the perirectal fascia off the vaginal mucosa bilaterally.  The rectocele was reduced.  A purse string suture of 2/0 Vicryl was placed at the top of the rectocele repair.  Vertical mattress sutures of 0 Vicryl were then used to reduce the rectocele. Excess vaginal mucosa was trimmed at this time and the posterior vaginal wall was closed with a running lock suture of 2-0 Vicryl down to the hymen.  This suture was then brought behind the hymen. The 2-0 Vicryl was used to then place running sutures along the transverse superficial perineal muscles.  This suture was then brought up the perineum in a subcuticular fashion and the knot was tied at the hymen as for an episiotomy-type repair.  Cystoscopy was performed, and the findings are as noted above.  Rectal exam was performed and there was no  evidence of any suture or foreign body in the rectum.  There was good support and elevation to the anterior apical and posterior vaginal walls.  A gauze packing with Estrace cream was placed inside the vagina.  All vaginal instruments were removed.   Final laparoscopy was performed after reinsufflation with CO2 gas.  Hemostasis was adequate. Arista was placed over the surgical field.   The left upper quadrant and left lower abdominal trocars were removed under visualization of the laparoscope.  The pneumoperitoneum was released, and the umbilical trocar and right lower quadrant trocars were removed after the patient received manual breaths to remove the remaining pneumoperitoneum.   All incisions were closed with subcuticular suture of 4/0 Vicryl and Dermabond.   The patient was awakened and extubated, and escorted to the recovery room in stable condition.  There were no complications.  All needle, instrument, and sponge counts  were correct.     Randye Lobo, M.D.

## 2017-04-24 NOTE — Progress Notes (Signed)
Nursing phone call at 11:45.  Pt's BP was 80/40s sitting and standing.  Pt talking.  No SOB.  No palpitations.  No dizziness.  Has eaten.  Pulse 78.  UOP last three hours 50 cc.  Has been much higher in the evening.  Will check stat H&H and monitor UOP closely.

## 2017-04-24 NOTE — Anesthesia Preprocedure Evaluation (Signed)
Anesthesia Evaluation  Patient identified by MRN, date of birth, ID band Patient awake    Reviewed: Allergy & Precautions, NPO status , Patient's Chart, lab work & pertinent test results  Airway Mallampati: I       Dental no notable dental hx. (+) Teeth Intact   Pulmonary neg pulmonary ROS,    Pulmonary exam normal        Cardiovascular negative cardio ROS Normal cardiovascular exam Rhythm:Regular Rate:Normal     Neuro/Psych    GI/Hepatic negative GI ROS, Neg liver ROS,   Endo/Other  negative endocrine ROS  Renal/GU negative Renal ROS  Female GU complaint     Musculoskeletal negative musculoskeletal ROS (+)   Abdominal Normal abdominal exam  (+)   Peds  Hematology negative hematology ROS (+)   Anesthesia Other Findings   Reproductive/Obstetrics                             Anesthesia Physical Anesthesia Plan  ASA: II  Anesthesia Plan: General   Post-op Pain Management:    Induction: Intravenous  PONV Risk Score and Plan: 4 or greater and Ondansetron, Dexamethasone and Midazolam  Airway Management Planned: Oral ETT  Additional Equipment:   Intra-op Plan:   Post-operative Plan: Extubation in OR  Informed Consent: I have reviewed the patients History and Physical, chart, labs and discussed the procedure including the risks, benefits and alternatives for the proposed anesthesia with the patient or authorized representative who has indicated his/her understanding and acceptance.   Dental advisory given  Plan Discussed with: CRNA and Surgeon  Anesthesia Plan Comments:         Anesthesia Quick Evaluation

## 2017-04-24 NOTE — Anesthesia Procedure Notes (Signed)
Procedure Name: Intubation Date/Time: 04/24/2017 7:39 AM Performed by: Georgeanne Nim Pre-anesthesia Checklist: Patient identified, Emergency Drugs available, Suction available, Timeout performed and Patient being monitored Patient Re-evaluated:Patient Re-evaluated prior to induction Oxygen Delivery Method: Circle system utilized Preoxygenation: Pre-oxygenation with 100% oxygen Induction Type: IV induction Ventilation: Mask ventilation without difficulty Laryngoscope Size: Mac and 3 Grade View: Grade I Tube type: Oral Tube size: 7.0 mm Number of attempts: 1 Airway Equipment and Method: Stylet Placement Confirmation: ETT inserted through vocal cords under direct vision,  CO2 detector,  positive ETCO2 and breath sounds checked- equal and bilateral Secured at: 20 cm Tube secured with: Tape Dental Injury: Teeth and Oropharynx as per pre-operative assessment

## 2017-04-24 NOTE — H&P (Signed)
Office Visit   04/23/2017 [facility]  Amundson Illene Regulus, Forrestine Him, MD  Obstetrics and Gynecology   Incomplete uterovaginal prolapse  Dx   Office Visit ; Referred by Shaune Pollack, MD  Reason for Visit   Additional Documentation   Vitals:   BP 110/76 (BP Location: Right Arm, Patient Position: Sitting, Cuff Size: Normal)   Pulse 72   Resp 16   Wt 127 lb (57.6 kg)   LMP 08/07/2001 (Approximate)   BMI 23.23 kg/m   BSA 1.59 m   Flowsheets:   Infectious Disease Screening,   Custom Formula Data,   MEWS Score,   Anthropometrics     Encounter Info:   Billing Info,   History,   Allergies,   Detailed Report     All Notes   Progress Notes by Patton Salles, MD at 04/23/2017 10:30 AM   Author: Patton Salles, MD Author Type: Physician Filed: 04/24/2017 6:19 AM  Note Status: Signed Cosign: Cosign Not Required Encounter Date: 04/23/2017  Editor: Patton Salles, MD (Physician)  Prior Versions: 1. Lum Babe CMA (Certified Medical Assistant) at 04/23/2017 11:00 AM - Sign at close encounter   2. Alphonsa Overall, CMA (Certified Medical Assistant) at 04/18/2017 10:15 AM - Sign at close encounter    GYNECOLOGY  VISIT   HPI: 66 y.o.   Widowed  Caucasian  female   G2P1002 with Patient's last menstrual period was 08/07/2001 (approximate).   here for surgical consult.    Desires surgery for pelvic organ prolapse. Had a trial of a pessary and developed recurrent vaginal infections. Takes an stool softener and Miralax nightly to keep bowel movements regular. Hx hemorrhoid surgery. No urinary incontinence with pessary use. Had rare leak of urine with sneeze prior to pessary usage.   Has known echogenic area of the left ovary 16 x 10 mm with internal echoes and 2 calcifications.  No abnormal blood flow.  Minor increase in size since prior US.  No free fluid.  Normal right ovary.  Uterus normal with EMS 1.67 mm.  CA125 = 9 and CEA =  0.9 on 01/25/17.  Hx transfusion 8 years ago due to bleeding ulcer.  Positive H Pylori.  No problems since then.  Does not take NSAIDs.  GYNECOLOGIC HISTORY: Patient's last menstrual period was 08/07/2001 (approximate). Contraception:  Postmenopausal Menopausal hormone therapy:  Estrace cream with pessary Last mammogram:07/26/16 Density B/BIRADS1:neg: TBC  Last pap smear: 01/17/17 negative - done with Dr. Kevan Ny 07/17/16 CIN1/LSIL. HR HPV:Neg                OB History    Gravida Para Term Preterm AB Living   SAB TAB Ectopic Multiple Live Births           2             Patient Active Problem List   Diagnosis Date Noted  . Dry eye syndrome 03/17/2016  . Headache 03/17/2016  . Hyperlipidemia 03/17/2016  . Lack of bladder control 03/17/2016  . Migraines 03/17/2016  . 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 Abnormal Pap smear of cervix 07/2016   CIN1  . Constipation   . Depression   . Diverticulosis   . H. pylori infection   . History of blood  transfusion 1984  . Hx of colonic polyps   . Hypercholesteremia   . Hyponatremia   . Migraines   . Vitamin D deficiency          Past Surgical History:  Procedure Laterality Date  . CESAREAN SECTION  1984  . COLONOSCOPY  2012  . COSMETIC SURGERY     Neck lift  . HEMORRHOID SURGERY  1984  . RECTAL PROLAPSE REPAIR     Dr. Kendrick Ranch  . UMBILICAL HERNIA REPAIR     had surgery as a child.          Current Outpatient Prescriptions  Medication Sig Dispense Refill  . acetaminophen (TYLENOL) 500 MG tablet Take 500 mg by mouth every 6 (six) hours as needed for mild pain or headache.     . chlordiazePOXIDE (LIBRIUM) 10 MG capsule Take 20 mg by mouth at bedtime.     . cholecalciferol (VITAMIN D) 1000 units tablet Take 1,000 Units by mouth at bedtime.    .  divalproex (DEPAKOTE ER) 250 MG 24 hr tablet Take 2 tablets (500 mg total) by mouth at bedtime. 60 tablet 5  . docusate sodium (COLACE) 100 MG capsule Take 100 mg by mouth at bedtime.     [name] estradiol (ESTRACE) 0.1 MG/GM vaginal cream Use 1/2 g vaginally two times per week. 42.5 g 1  . lansoprazole (PREVACID) 30 MG capsule Take 30 mg by mouth 2 (two) times daily before a meal.     . nystatin-triamcinolone (MYCOLOG II) cream Apply 1 application topically 2 (two) times daily. Apply to affected area BID for up to 7 days. 60 g 0  . Omega-3 Fatty Acids (FISH OIL PO) Take 1 capsule by mouth daily.    . polyethylene glycol (MIRALAX / GLYCOLAX) packet Take 17 g by mouth at bedtime.     . Simethicone (PHAZYME MAXIMUM STRENGTH) 250 MG CAPS Take 250 mg by mouth daily as needed (gas).    . SUMAtriptan (IMITREX) 100 MG tablet Take 1 tablet.  May repeat once in 2 hours if headache persists or recurs. (Patient taking differently: Take 100 mg by mouth every 2 (two) hours as needed. Take 1 tablet.  May repeat once in 2 hours if headache persists or recurs.) 10 tablet 2  . XIIDRA 5 % SOLN Place 1 drop into both eyes 2 (two) times daily.      No current facility-administered medications for this visit.             Facility-Administered Medications Ordered in Other Visits  Medication Dose Route Frequency Provider Last Rate Last Dose  . cefoTEtan in Dextrose 5% (CEFOTAN) IVPB 2 g  2 g Intravenous On Call to OR Jerene Bears, MD         ALLERGIES: Aspirin; Crestor [rosuvastatin calcium]; Inderal [propranolol]; Other; Relpax [eletriptan hydrobromide]; Salicylates; and Topamax [topiramate]       Family History  Problem Relation Age of Onset  . Migraines Mother   . Migraines Sister   . Cancer Father        Prostate    Social History        Social History  . Marital status: Widowed    Spouse name: N/A  . Number of children: N/A  . Years of education: N/A       Occupational  History  . LPN         Social History Main Topics  . Smoking status: Never Smoker  . Smokeless tobacco: Never Used  .  Alcohol use No  . Drug use: No  . Sexual activity: Yes    Birth control/ protection: Post-menopausal       Other Topics Concern  . Not on file      Social History Narrative   Pt has L.P.N through [facility]    ROS:  Pertinent items are noted in HPI.  PHYSICAL EXAMINATION:    BP 110/76 (BP Location: Right Arm, Patient Position: Sitting, Cuff Size: Normal)   Pulse 72   Resp 16   Wt 127 lb (57.6 kg)   LMP 08/07/2001 (Approximate)   BMI 23.23 kg/m     General appearance: alert, cooperative and appears stated age Head: Normocephalic, without obvious abnormality, atraumatic Neck: no adenopathy, supple, symmetrical, trachea midline and thyroid normal to inspection and palpation Lungs: clear to auscultation bilaterally Heart: regular rate and rhythm Abdomen: soft, non-tender, no masses,  no organomegaly Extremities: extremities normal, atraumatic, no cyanosis or edema Skin: Skin color, texture, turgor normal. No rashes or lesions Lymph nodes: Cervical, supraclavicular, and axillary nodes normal. No abnormal inguinal nodes palpated Neurologic: Grossly normal  Pelvic: External genitalia:  no lesions              Urethra:  normal appearing urethra with no masses, tenderness or lesions              Bartholins and Skenes: normal                 Vagina: normal appearing vagina with normal color and discharge, no lesions/  First to second degree cystocele, first degree uterine prolapse, first degree rectocele.               Cervix: no lesions                Bimanual Exam:  Uterus:  normal size, contour, position, consistency, mobility, non-tender              Adnexa: no mass, fullness, tenderness              Rectal exam: Yes.  .  Confirms.              Anus:  normal sphincter tone, no lesions  Chaperone was present for  exam.  ASSESSMENT  Incomplete uterovaginal prolapse. Echogenic left ovary.  Hx LGSIL pap with follow up pap normal. Hx Cesarean Section.  Hx umbilical herniorrhaphy. Hx bleeding ulcer.  On Librium for sleep assistance.  Elevated TSH on 02/22/17 on chat review.  Currently off Synthroid. SIADH.   PLAN  We discussed laparoscopically assisted vaginal hysterectomy with bilateral salpingo-oophorectomy, anterior and posterior colporrhaphy, cystoscopy, and collection of pelvic washings.  Risks, benefits, and alternatives reviewed with the patient who wishes to proceed.  Patient does understand up to 30% recurrence rates for prolapse and incontinence following surgery.  Surgical recovery and expectations reviewed. Enema tonight.  No NSAIDs post op. Will take just one Librium (10 mg) at night while taking narcotic.    An After Visit Summary was printed and given to the patient.  _25_____ minutes face to face time of which over 50% was spent in counseling.

## 2017-04-24 NOTE — Progress Notes (Signed)
Day of Surgery Procedure(s) (LRB): LAPAROSCOPIC ASSISTED VAGINAL HYSTERECTOMY WITH SALPINGO OOPHORECTOMY and pelvic washings (Bilateral) ANTERIOR (CYSTOCELE) AND POSTERIOR (RECTOCELE)  REPAIR (N/A) CYSTOSCOPY (N/A) EXTENSIVE LAPAROSCOPIC LYSIS OF ADHESIONS (N/A)  Subjective: Patient reports she would like to eat food.  Ambulated once with the nurse.  Catheter is irritating.   Objective: I have reviewed patient's vital signs and intake and output. Vitals:   04/24/17 1608 04/24/17 1707  BP: 114/60 (!) 112/57  Pulse: 85 83  Resp: 15 18  Temp: 98.3 F (36.8 C) 97.8 F (36.6 C)  SpO2: 100% 99%   I/O - 2800 cc/675 cc UO  General: alert and cooperative Resp: clear to auscultation bilaterally Cardio: regular rate and rhythm, S1, S2 normal, no murmur, click, rub or gallop GI: soft, non-tender; bowel sounds normal; no masses,  no organomegaly and incision: clean, dry, intact and LLQ incision wiht 3 cm indurated area and no bleeding. Extremities: TED hose and PAS on.  DPs 2+ bilaterally.  Vaginal Bleeding: minimal  Assessment: s/p Procedure(s) with comments: LAPAROSCOPIC ASSISTED VAGINAL HYSTERECTOMY WITH SALPINGO OOPHORECTOMY and pelvic washings (Bilateral) - 3 hours ANTERIOR (CYSTOCELE) AND POSTERIOR (RECTOCELE)  REPAIR (N/A) CYSTOSCOPY (N/A) EXTENSIVE LAPAROSCOPIC LYSIS OF ADHESIONS (N/A): stable  I suspect a hematoma of the LLQ incision.   Plan: Advance diet  Foley overnight.  CBC and BMP in am.  Will watch LLQ incision.   Surgical findings and procedure discussed.  Questions answered.   LOS: 0 days    Melony Overly 04/24/2017, 5:55 PM

## 2017-04-24 NOTE — Transfer of Care (Signed)
Immediate Anesthesia Transfer of Care Note  Patient: [name]  Procedure(s) Performed: Procedure(s) with comments: LAPAROSCOPIC ASSISTED VAGINAL HYSTERECTOMY WITH SALPINGO OOPHORECTOMY and pelvic washings (Bilateral) - 3 hours ANTERIOR (CYSTOCELE) AND POSTERIOR (RECTOCELE)  REPAIR (N/A) CYSTOSCOPY (N/A) EXTENSIVE LAPAROSCOPIC LYSIS OF ADHESIONS (N/A)  Patient Location: PACU  Anesthesia Type:General  Level of Consciousness: awake, alert  and patient cooperative  Airway & Oxygen Therapy: Patient Spontanous Breathing and Patient connected to nasal cannula oxygen  Post-op Assessment: Report given to RN and Post -op Vital signs reviewed and stable  Post vital signs: Reviewed and stable  Last Vitals:  Vitals:   04/24/17 0618  BP: (!) 118/56  Pulse: (!) 58  Resp: 18  Temp: 36.5 C  SpO2: 100%    Last Pain:  Vitals:   04/24/17 0618  TempSrc: Oral      Patients Stated Pain Goal: 4 (04/24/17 0618)  Complications: No apparent anesthesia complications

## 2017-04-24 NOTE — Brief Op Note (Signed)
04/24/2017  12:25 PM  PATIENT:  [name]  66 y.o. female  PRE-OPERATIVE DIAGNOSIS:  incomplete uterovaginal prolapse, left ovarian mass  POST-OPERATIVE DIAGNOSIS:  incomplete uterovaginal prolapse, left ovarian mass, PELVIC ADHESIONS  PROCEDURE:  Procedure(s) with comments: LAPAROSCOPIC ASSISTED VAGINAL HYSTERECTOMY WITH SALPINGO OOPHORECTOMY and pelvic washings (Bilateral) - 3 hours ANTERIOR (CYSTOCELE) AND POSTERIOR (RECTOCELE)  REPAIR (N/A) CYSTOSCOPY (N/A) EXTENSIVE LAPAROSCOPIC LYSIS OF ADHESIONS (N/A)  SURGEON:  Surgeon(s) and Role:    * Patton Salles, MD - Primary    * Jerene Bears, MD - Assisting  PHYSICIAN ASSISTANT: NA  ASSISTANTS: Annamaria Boots, MD   ANESTHESIA:   local and general  EBL:  Total I/O In: 2300 [I.V.:2300] Out: 300 [Urine:150; Blood:150]  BLOOD ADMINISTERED:none  DRAINS: Urinary Catheter (Foley)   LOCAL MEDICATIONS USED:  MARCAINE    and LIDOCAINE   SPECIMEN:  Source of Specimen:  1 - uterus, cervix, bilateral tubes and ovaries.  2 - pelvic washings  DISPOSITION OF SPECIMEN:  PATHOLOGY  COUNTS:  YES  TOURNIQUET:  * No tourniquets in log *  DICTATION: .Other Dictation: Dictation Number    PLAN OF CARE: Admit for overnight observation  PATIENT DISPOSITION:  PACU - hemodynamically stable.   Delay start of Pharmacological VTE agent (>24hrs) due to surgical blood loss or risk of bleeding: not applicable

## 2017-04-24 NOTE — Progress Notes (Signed)
Update to History and Physical  Patient states her PCP checked her free T3 and free T4 which were normal despite the mild elevation in her TSH.  She is not on Synthroid.   Patient has received Lovenox this morning.  Patient examined.   OK to proceed with surgery.

## 2017-04-24 NOTE — OR Nursing (Signed)
SPECIMEN WEIGHED IN OR AT 60.4 GRAMS

## 2017-04-24 NOTE — Anesthesia Postprocedure Evaluation (Signed)
Anesthesia Post Note  Patient: [name]  Procedure(s) Performed: Procedure(s) (LRB): LAPAROSCOPIC ASSISTED VAGINAL HYSTERECTOMY WITH SALPINGO OOPHORECTOMY and pelvic washings (Bilateral) ANTERIOR (CYSTOCELE) AND POSTERIOR (RECTOCELE)  REPAIR (N/A) CYSTOSCOPY (N/A) EXTENSIVE LAPAROSCOPIC LYSIS OF ADHESIONS (N/A)     Patient location during evaluation: PACU Anesthesia Type: General Level of consciousness: awake, awake and alert and oriented Pain management: pain level controlled Vital Signs Assessment: post-procedure vital signs reviewed and stable Respiratory status: spontaneous breathing, nonlabored ventilation and respiratory function stable Cardiovascular status: stable Postop Assessment: no apparent nausea or vomiting Anesthetic complications: no    Last Vitals:  Vitals:   04/24/17 1513 04/24/17 1608  BP: (!) 104/50 114/60  Pulse: 83 85  Resp: 16 15  Temp: 36.6 C 36.8 C  SpO2: 100% 100%    Last Pain:  Vitals:   04/24/17 1608  TempSrc:   PainSc: 6    Pain Goal: Patients Stated Pain Goal: 4 (04/24/17 1400)               Cecile Hearing

## 2017-04-25 ENCOUNTER — Encounter (HOSPITAL_COMMUNITY): Payer: Self-pay | Admitting: Obstetrics and Gynecology

## 2017-04-25 ENCOUNTER — Telehealth: Payer: Self-pay | Admitting: Obstetrics and Gynecology

## 2017-04-25 ENCOUNTER — Institutional Professional Consult (permissible substitution): Payer: Medicare Other | Admitting: Obstetrics and Gynecology

## 2017-04-25 DIAGNOSIS — N812 Incomplete uterovaginal prolapse: Secondary | ICD-10-CM | POA: Diagnosis not present

## 2017-04-25 LAB — BASIC METABOLIC PANEL
Anion gap: 5 (ref 5–15)
BUN: 8 mg/dL (ref 6–20)
CHLORIDE: 100 mmol/L — AB (ref 101–111)
CO2: 26 mmol/L (ref 22–32)
CREATININE: 0.49 mg/dL (ref 0.44–1.00)
Calcium: 7.7 mg/dL — ABNORMAL LOW (ref 8.9–10.3)
Glucose, Bld: 98 mg/dL (ref 65–99)
POTASSIUM: 3.5 mmol/L (ref 3.5–5.1)
SODIUM: 131 mmol/L — AB (ref 135–145)

## 2017-04-25 LAB — HEMOGLOBIN AND HEMATOCRIT, BLOOD
HEMATOCRIT: 27.8 % — AB (ref 36.0–46.0)
HEMOGLOBIN: 9.9 g/dL — AB (ref 12.0–15.0)

## 2017-04-25 LAB — CBC
HCT: 26.1 % — ABNORMAL LOW (ref 36.0–46.0)
Hemoglobin: 9.2 g/dL — ABNORMAL LOW (ref 12.0–15.0)
MCH: 33.6 pg (ref 26.0–34.0)
MCHC: 35.2 g/dL (ref 30.0–36.0)
MCV: 95.3 fL (ref 78.0–100.0)
PLATELETS: 125 10*3/uL — AB (ref 150–400)
RBC: 2.74 MIL/uL — AB (ref 3.87–5.11)
RDW: 12.6 % (ref 11.5–15.5)
WBC: 6.5 10*3/uL (ref 4.0–10.5)

## 2017-04-25 MED ORDER — OXYCODONE-ACETAMINOPHEN 5-325 MG PO TABS: 1.0000 | ORAL_TABLET | ORAL | 0 refills | Status: DC | PRN

## 2017-04-25 NOTE — Telephone Encounter (Signed)
Patient called and rescheduled her one week post op appointment with Dr. Edward Jolly from 05/01/17 to Friday, 04/27/17 at 3:00 PM. Patient stated she is still in the hospital this morning and that she was told by Dr. Edward Jolly to do this. Routing to provider for FYI.

## 2017-04-25 NOTE — Discharge Instructions (Signed)
Laparoscopically Assisted Vaginal Hysterectomy, Care After Refer to this sheet in the next few weeks. These instructions provide you with information on caring for yourself after your procedure. Your health care provider may also give you more specific instructions. Your treatment has been planned according to current medical practices, but problems sometimes occur. Call your health care provider if you have any problems or questions after your procedure. What can I expect after the procedure? After your procedure, it is typical to have the following:  Abdominal pain. You will be given pain medicine to control it.  Sore throat from the breathing tube that was inserted during surgery.  Follow these instructions at home:  Only take over-the-counter or prescription medicines for pain, discomfort, or fever as directed by your health care provider.  Do not take aspirin. It can cause bleeding.  Do not drive when taking pain medicine.  Follow your health care provider's advice regarding diet, exercise, lifting, driving, and general activities.  Resume your usual diet as directed and allowed.  Get plenty of rest and sleep.  Do not douche, use tampons, or have sexual intercourse for at least 6 weeks, or until your health care provider gives you permission.  Change your bandages (dressings) as directed by your health care provider.  Monitor your temperature and notify your health care provider of a fever.  Take showers instead of baths for 2-3 weeks.  Do not drink alcohol until your health care provider gives you permission.  If you develop constipation, you may take a mild laxative with your health care provider's permission. Bran foods may help with constipation problems. Drinking enough fluids to keep your urine clear or pale yellow may help as well.  Try to have someone home with you for 1-2 weeks to help around the house.  Keep all of your follow-up appointments as directed by your  health care provider. Contact a health care provider if:  You have swelling, redness, or increasing pain around your incision sites.  You have pus coming from your incision.  You notice a bad smell coming from your incision.  Your incision breaks open.  You feel dizzy or lightheaded.  You have pain or bleeding when you urinate.  You have persistent diarrhea.  You have persistent nausea and vomiting.  You have abnormal vaginal discharge.  You have a rash.  You have any type of abnormal reaction or develop an allergy to your medicine.  You have poor pain control with your prescribed medicine. Get help right away if:  You have a fever.  You have severe abdominal pain.  You have chest pain.  You have shortness of breath.  You faint.  You have pain, swelling, or redness in your leg.  You have heavy vaginal bleeding with blood clots. This information is not intended to replace advice given to you by your health care provider. Make sure you discuss any questions you have with your health care provider. Document Released: 07/13/2011 Document Revised: 12/30/2015 Document Reviewed: 02/06/2013 Elsevier Interactive Patient Education  2017 [employer]  Anterior and Posterior Colporrhaphy, Care After Refer to this sheet in the next few weeks. These instructions provide you with information on caring for yourself after your procedure. Your health care provider may also give you more specific instructions. Your treatment has been planned according to current medical practices, but problems sometimes occur. Call your health care provider if you have any problems or questions after your procedure. Follow these instructions at home:  Take frequent rest  periods throughout the day.  Only take over-the-counter or prescription medicines as directed by your health care provider.  Avoid strenuous activity such as heavy lifting (more than 10 pounds [4.5 kg]), pushing, and pulling until  your health care provider says it is okay.  Take showers if your health care provider approves. Pat incisions dry. Do not rub incisions with a washcloth or towel. Do not take tub baths until your health care provider approves.  Wear compression stockings as directed by your health care provider. These stockings help prevent blood clots from forming in your legs.  Talk with your health care provider about when you may return to work and your exercise routine.  Do not drive until your health care provider approves.  You may resume your normal diet. Eat a well-balanced diet.  Drink enough fluids to keep your urine clear or pale yellow.  Your normal bowel function should return. If you become constipated, you may: ? Take a mild laxative. ? Add fruit and bran to your diet. ? Drink more fluids.  Do not have sexual intercourse until permitted by your health care provider.  Follow up with your health care provider as directed. Contact a health care provider if: You have persistent nausea or vomiting. Get help right away if:  You have increased bleeding (more than a small spot) from the vaginal area.  Your pain is not relieved with medicine or becomes worse.  You have redness, swelling, or increasing pain in the vaginal area.  You have abdominal pain.  You see pus coming from the wounds.  You develop a fever.  You have a foul smell coming from your vaginal area.  You develop light-headedness or you feel faint.  You have difficulty breathing. This information is not intended to replace advice given to you by your health care provider. Make sure you discuss any questions you have with your health care provider. Document Released: 02/09/2005 Document Revised: 12/30/2015 Document Reviewed: 12/13/2012 Elsevier Interactive Patient Education  2017 [employer].

## 2017-04-25 NOTE — Plan of Care (Signed)
Problem: Activity: Goal: Risk for activity intolerance will decrease c  Comments: Pt is tolerating Regular diet without nausea. Sh has ambulated in hallway and tolerated activity well. Using PCA with Morphine for pain control. Urine output decreased: from admission from PACU @ 1330 to 1640=500 ml. From 1640-2100= 800 ml and from 2100-2342= 50 ml. Also pt had BP reading of 87/46 at 2342. Dr. Hyacinth Meeker was notified and ordered a stat H&H. Pt is alert, and denies shortness of breath, dizziness or feelings of anxiety. Will continue to monitor UOP. PCA to continue throughout the night per Dr. Hyacinth Meeker.

## 2017-04-25 NOTE — Progress Notes (Signed)
1 Day Post-Op Procedure(s) (LRB): LAPAROSCOPIC ASSISTED VAGINAL HYSTERECTOMY WITH SALPINGO OOPHORECTOMY and pelvic washings (Bilateral) ANTERIOR (CYSTOCELE) AND POSTERIOR (RECTOCELE)  REPAIR (N/A) CYSTOSCOPY (N/A) EXTENSIVE LAPAROSCOPIC LYSIS OF ADHESIONS (N/A)  Subjective: Patient reports tolerating PO and + flatus.    Had low BP during the night and Hgb was measured at 9.9.  Hgb now 9.2.   Objective: I have reviewed patient's vital signs, intake and output and labs. Vitals:   04/25/17 0616 04/25/17 0632  BP:    Pulse:    Resp:  18  Temp:    SpO2: 100% 100%    I/O - 3370 cc/2275 cc.  Gen:  NAD.   Resp: clear to auscultation bilaterally Cardio: regular rate and rhythm, S1, S2 normal, no murmur, click, rub or gallop GI: soft, non-tender; bowel sounds normal; no masses,  no organomegaly and incision: clean, dry, intact and left lower quadrant incision with induration consistent with hemotoma in subQ.  Nontender.  About 2.5 cm.  Extremities: PAS on. Vaginal Bleeding: minimal  Assessment: s/p Procedure(s) with comments: LAPAROSCOPIC ASSISTED VAGINAL HYSTERECTOMY WITH SALPINGO OOPHORECTOMY and pelvic washings (Bilateral) - 3 hours ANTERIOR (CYSTOCELE) AND POSTERIOR (RECTOCELE)  REPAIR (N/A) CYSTOSCOPY (N/A) EXTENSIVE LAPAROSCOPIC LYSIS OF ADHESIONS (N/A): progressing well  Low BP post op.  I think this is effect of anesthesia and PCA and Librium taken last hs. Anemia is appropriate.   Plan: Encourage ambulation Advance to PO medication Voiding trials.   Plan for discharge today.  Follow up in 2 days in office.  Instructions and precautions reviewed. Rx for Percocet.   LOS: 0 days    Brook Ananias Pilgrim 04/25/2017, 7:59 AM

## 2017-04-27 ENCOUNTER — Ambulatory Visit (INDEPENDENT_AMBULATORY_CARE_PROVIDER_SITE_OTHER): Payer: Medicare Other | Admitting: Obstetrics and Gynecology

## 2017-04-27 ENCOUNTER — Encounter: Payer: Self-pay | Admitting: Obstetrics and Gynecology

## 2017-04-27 VITALS — BP 132/70 | HR 70 | Ht 59.5 in | Wt 134.8 lb

## 2017-04-27 DIAGNOSIS — Z9889 Other specified postprocedural states: Secondary | ICD-10-CM

## 2017-04-27 DIAGNOSIS — D649 Anemia, unspecified: Secondary | ICD-10-CM

## 2017-04-27 NOTE — Progress Notes (Signed)
GYNECOLOGY  VISIT   HPI: 66 y.o.   Widowed  Caucasian  female   G2P1002 with Patient's last menstrual period was 08/07/2001 (approximate).   here for 3 day follow up  LAPAROSCOPIC ASSISTED VAGINAL HYSTERECTOMY WITH SALPINGO OOPHORECTOMY and pelvic washings (Bilateral ) ANTERIOR (CYSTOCELE) AND POSTERIOR (RECTOCELE) REPAIR (N/A ) CYSTOSCOPY (N/A ) EXTENSIVE LAPAROSCOPIC LYSIS OF ADHESIONS (N/A Abdomen).  Pathology report - benign uterus, cervix, bilateral tubes and ovaries.  Left tube with salpingitis isthmic nodosa.  Thinks she is not taking Percocet enough.    Voided on post op day one in hospital prior to discharge.  Had frequency of small amounts of urine the first night after leaving the hospital. Now voiding better. No dysuria. No fevers, shakes, chills.   Bleeding is less now.   Had a BM today.  Taking Miralax and stool softener.  Was really concerned about constipation issues occurring post op.   Hgb 10.3.  GYNECOLOGIC HISTORY: Patient's last menstrual period was 08/07/2001 (approximate). Contraception: Postmenopausal Menopausal hormone therapy:  Estrace Cream with pessary Last mammogram: 07/26/16 Density B/BIRADS1:neg: TBC  Last pap smear: 01/17/17 negative - done with Dr. Kevan Ny 07/17/16 CIN1/LSIL. HR HPV:Neg        OB History    Gravida Para Term Preterm AB Living   SAB TAB Ectopic Multiple Live Births           2         Patient Active Problem List   Diagnosis Date Noted  . Status post laparoscopy-assisted vaginal hysterectomy 04/24/2017  . Dry eye syndrome 03/17/2016  . Headache 03/17/2016  . Hyperlipidemia 03/17/2016  . Lack of bladder control 03/17/2016  . Migraines 03/17/2016  . 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 Abnormal Pap smear of cervix 07/2016   CIN1  . Constipation   . Depression   . Diverticulosis   .  H. pylori infection   . History of blood transfusion 1984  . Hx of colonic polyps   . Hypercholesteremia   . Hyponatremia   . Migraines   . Vitamin D deficiency     Past Surgical History:  Procedure Laterality Date  . ANTERIOR AND POSTERIOR REPAIR N/A 04/24/2017   Procedure: ANTERIOR (CYSTOCELE) AND POSTERIOR (RECTOCELE)  REPAIR;  Surgeon: Patton Salles, MD;  Location: [facility];  Service: Gynecology;  Laterality: N/A;  . CESAREAN SECTION  1984  . COLONOSCOPY  2012  . COSMETIC SURGERY     Neck lift  . CYSTOSCOPY N/A 04/24/2017   Procedure: CYSTOSCOPY;  Surgeon: Patton Salles, MD;  Location: [facility];  Service: Gynecology;  Laterality: N/A;  . HEMORRHOID SURGERY  1984  . LAPAROSCOPIC LYSIS OF ADHESIONS N/A 04/24/2017   Procedure: EXTENSIVE LAPAROSCOPIC LYSIS OF ADHESIONS;  Surgeon: Patton Salles, MD;  Location: [facility];  Service: Gynecology;  Laterality: N/A;  . LAPAROSCOPIC VAGINAL HYSTERECTOMY WITH SALPINGO OOPHORECTOMY Bilateral 04/24/2017   Procedure: LAPAROSCOPIC ASSISTED VAGINAL HYSTERECTOMY WITH SALPINGO OOPHORECTOMY and pelvic washings;  Surgeon: Patton Salles, MD;  Location: [facility];  Service: Gynecology;  Laterality: Bilateral;  3 hours  . RECTAL PROLAPSE REPAIR     Dr. Kendrick Ranch  . UMBILICAL HERNIA REPAIR     had surgery as a child.    Current Outpatient Prescriptions  Medication Sig Dispense Refill  .  acetaminophen (TYLENOL) 325 MG tablet Take 325 mg by mouth every 6 (six) hours as needed.     . chlordiazePOXIDE (LIBRIUM) 10 MG capsule Take 20 mg by mouth at bedtime.     . cholecalciferol (VITAMIN D) 1000 units tablet Take 1,000 Units by mouth at bedtime.    . divalproex (DEPAKOTE ER) 250 MG 24 hr tablet Take 2 tablets (500 mg total) by mouth at bedtime. 60 tablet 5  . docusate sodium (COLACE) 100 MG capsule Take 100 mg by mouth at bedtime.     . lansoprazole (PREVACID) 30 MG capsule Take 30 mg by mouth 2 (two) times daily before a  meal.     . Omega-3 Fatty Acids (FISH OIL PO) Take 1 capsule by mouth daily.    [name] oxyCODONE-acetaminophen (PERCOCET/ROXICET) 5-325 MG tablet Take 1-2 tablets by mouth every 4 (four) hours as needed for severe pain (moderate to severe pain (when tolerating fluids)). 30 tablet 0  . polyethylene glycol (MIRALAX / GLYCOLAX) packet Take 17 g by mouth at bedtime.     . Simethicone (PHAZYME MAXIMUM STRENGTH) 250 MG CAPS Take 250 mg by mouth daily as needed (gas).    . SUMAtriptan (IMITREX) 100 MG tablet Take 1 tablet.  May repeat once in 2 hours if headache persists or recurs. (Patient taking differently: Take 100 mg by mouth every 2 (two) hours as needed. Take 1 tablet.  May repeat once in 2 hours if headache persists or recurs.) 10 tablet 2  . XIIDRA 5 % SOLN Place 1 drop into both eyes 2 (two) times daily.      No current facility-administered medications for this visit.      ALLERGIES: Aspirin; Crestor [rosuvastatin calcium]; Inderal [propranolol]; Other; Relpax [eletriptan hydrobromide]; Salicylates; and Topamax [topiramate]  Family History  Problem Relation Age of Onset  . Migraines Mother   . Migraines Sister   . Cancer Father        Prostate    Social History   Social History  . Marital status: Widowed    Spouse name: N/A  . Number of children: N/A  . Years of education: N/A   Occupational History  . LPN    Social History Main Topics  . Smoking status: Never Smoker  . Smokeless tobacco: Never Used  . Alcohol use No  . Drug use: No  . Sexual activity: Yes    Birth control/ protection: Post-menopausal, Surgical     Comment: Hyst   Other Topics Concern  . Not on file   Social History Narrative   Pt has L.P.N through [facility]    ROS:  Pertinent items are noted in HPI.  PHYSICAL EXAMINATION:    BP 132/70 (BP Location: Right Arm, Patient Position: Sitting, Cuff Size: Normal)   Pulse 70   Ht 4' 11.5" (1.511 m)   Wt 134 lb 12.8 oz (61.1 kg)   LMP 08/07/2001 (Approximate)    BMI 26.77 kg/m     General appearance: alert, cooperative and appears stated age   Abdomen: incisions intact.  LLQ incision with minimal induration.  Abdomen is soft, non-tender, no masses,  no organomegaly   Pelvic:  Deferred.  Chaperone was present for exam.  ASSESSMENT  Doing well post op.  Anemia improved.   PLAN  Surgical findings, procedure, and pathology report discussed. I did encourage her to use percocet 1 tablet tid if needed.  Full CBC ordered.  She will follow up in 1 - 2 weeks so I can do a  bimanual pelvic exam.    An After Visit Summary was printed and given to the patient.

## 2017-04-28 LAB — CBC
HEMATOCRIT: 30.6 % — AB (ref 34.0–46.6)
Hemoglobin: 10.3 g/dL — ABNORMAL LOW (ref 11.1–15.9)
MCH: 32.8 pg (ref 26.6–33.0)
MCHC: 33.7 g/dL (ref 31.5–35.7)
MCV: 98 fL — ABNORMAL HIGH (ref 79–97)
PLATELETS: 185 10*3/uL (ref 150–379)
RBC: 3.14 x10E6/uL — ABNORMAL LOW (ref 3.77–5.28)
RDW: 12.7 % (ref 12.3–15.4)
WBC: 6.8 10*3/uL (ref 3.4–10.8)

## 2017-05-01 ENCOUNTER — Ambulatory Visit: Payer: Medicare Other | Admitting: Obstetrics and Gynecology

## 2017-05-01 ENCOUNTER — Other Ambulatory Visit (HOSPITAL_COMMUNITY): Payer: Self-pay | Admitting: Obstetrics and Gynecology

## 2017-05-02 ENCOUNTER — Ambulatory Visit (INDEPENDENT_AMBULATORY_CARE_PROVIDER_SITE_OTHER): Payer: Medicare Other | Admitting: Obstetrics and Gynecology

## 2017-05-02 ENCOUNTER — Encounter: Payer: Self-pay | Admitting: Obstetrics and Gynecology

## 2017-05-02 VITALS — BP 120/76 | HR 84 | Temp 98.3°F | Ht 59.5 in | Wt 130.8 lb

## 2017-05-02 DIAGNOSIS — R319 Hematuria, unspecified: Secondary | ICD-10-CM

## 2017-05-02 DIAGNOSIS — R102 Pelvic and perineal pain: Secondary | ICD-10-CM | POA: Diagnosis not present

## 2017-05-02 DIAGNOSIS — Z9889 Other specified postprocedural states: Secondary | ICD-10-CM

## 2017-05-02 LAB — POCT URINALYSIS DIPSTICK
BILIRUBIN UA: NEGATIVE
Glucose, UA: NEGATIVE
KETONES UA: NEGATIVE
Nitrite, UA: NEGATIVE
PH UA: 5 (ref 5.0–8.0)
PROTEIN UA: NEGATIVE
Urobilinogen, UA: 0.2 E.U./dL

## 2017-05-02 NOTE — Telephone Encounter (Signed)
Spoke with patient. S/p LAVH. Patient requesting refill of oxycodone. States she will not have enough left for the weekend if she takes 2-3 per day, f/u appointment previously scheduled for 10/1. Has 10 oxycodone tabs left.   Reports last oxycodone x1 last night with Tylenol 325 mg , has not taken any this morning, was unsure if appointment would be needed today. Reports pain in lower back and lower stomach as pressure, 7/10.   Denies any urinary complaints. Reports BM as "normal" and soft abdomen. Bleeding as spotting.   Advised patient will review with Dr. Edward Jolly and return call. Patient is agreeable.   Dr. Edward Jolly -ok to take oxycodone now and move appointment up? Please advise?

## 2017-05-02 NOTE — Telephone Encounter (Signed)
Reviewed with Dr. Edward Jolly -recommends OV for today.  Spoke with patient, scheduled for OV today at 3pm with Dr. Edward Jolly. Patient states she will take oxycodone and tylenol this morning. Patient verbalizes understanding and is agreeable to date and time.  Routing to provider for final review. Patient is agreeable to disposition. Will close encounter.

## 2017-05-02 NOTE — Discharge Summary (Signed)
Physician Discharge Summary  Patient ID: [name] MRN: [id number] DOB/AGE: 02/09/1951 66 y.o.  Admit date: 04/24/2017 Discharge date: 04/25/17  Admission Diagnoses: 1.  Incomplete uterovaginal prolapse. 2.  Echogenic left ovary.  Discharge Diagnoses:  1.  Incomplete uterovagainal prolapse.  2.  Echogenic left ovary.  3.  Severe left adnexal adhesions. 4.  Status post laparoscopically assisted vaginal hysterectomy with bilateral salpingo-oophorectomy, extensive lysis of adhesions, collection of pelvic washings, anterior and posterior colporrhaphy, cystoscopy.   Active Problems:   Status post laparoscopy-assisted vaginal hysterectomy   Discharged Condition: good  Hospital Course:  The patient was admitted on 04/24/17 for a laparoscopically assisted vaginal hysterectomy with bilateral salpingo-oophorectomy, extensive lysis of adhesions, collection of pelvic washings, anterior and posterior colporrhaphy, cystoscopy.  which were performed without complication while under general anesthesia. On the evening of her surgery, the patient had low blood pressure and low urinary output.  She had a hemoglobin which measured 9.9.  She was otherwise stable and she was supported with IVF.  The following morning, it measured 9.2   She had a morphine PCA for pain control initially, and this was converted over to Percocet and Motrin on post op day one when the patient began taking po well.  She ambulated independently and wore PAS  for DVT prophylaxis while in bed.  She received Lovenox preop.  Her foley catheter were removed on post op day one, and she voided good volumes. She showed no signs of infection during her hospitalization. She had very minimal vaginal bleeding, and her incision(s) demonstrated no signs of erythema or significant drainage.  Her left lower quadrant incision was indurated and suspicious for an incisional hematoma.  This was treated with an ice pack, which helped to resolve the  swelling.  No drainage occurred from this incision, and she had no significant ecchymoses of the skin.  She was found to be in good condition and ready for discharge on post op day one.  Consults: None  Significant Diagnostic Studies: labs:  See Hospital Course.  Treatments: surgery:  laparoscopically assisted vaginal hysterectomy with bilateral salpingo-oophorectomy, extensive lysis of adhesions, collection of pelvic washings, anterior and posterior colporrhaphy, cystoscopy.  Discharge Exam: Blood pressure (!) 122/57, pulse 77, temperature 98.4 F (36.9 C), temperature source Oral, resp. rate 18, height  (1.575 m), weight 127 lb (57.6 kg), last menstrual period 08/07/2001, SpO2 99 %. I/O - 3370 cc/2275 cc.  Gen:  NAD.   Resp: clear to auscultation bilaterally Cardio: regular rate and rhythm, S1, S2 normal, no murmur, click, rub or gallop GI: soft, non-tender; bowel sounds normal; no masses,  no organomegaly and incision: clean, dry, intact and left lower quadrant incision with induration consistent with hemotoma in subQ.  Nontender.  About 2.5 cm.  Extremities: PAS on. Vaginal Bleeding: minimal  Disposition: 01-Home or Self Care  Discharge instructions were reviewed in verbal and written form.  Allergies as of 04/25/2017      Reactions   Aspirin Other (See Comments)   Hx of bleeding stomach ulcer   Crestor [rosuvastatin Calcium] Itching   Inderal [propranolol] Other (See Comments)   Chest tightness   Other    Other reaction(s): Other (See Comments) Cholesterol meds-multiple reactions to multiple meds   Relpax [eletriptan Hydrobromide] Other (See Comments)   Chest Tightness   Salicylates Other (See Comments)   Told not to take due to hx of GI ulcer   Topamax [topiramate] Other (See Comments)   Chest tightness  Medication List    STOP taking these medications   acetaminophen 500 MG tablet Commonly known as:  TYLENOL   estradiol 0.1 MG/GM vaginal cream Commonly  known as:  ESTRACE   nystatin-triamcinolone cream Commonly known as:  MYCOLOG II     TAKE these medications   chlordiazePOXIDE 10 MG capsule Commonly known as:  LIBRIUM Take 20 mg by mouth at bedtime.   cholecalciferol 1000 units tablet Commonly known as:  VITAMIN D Take 1,000 Units by mouth at bedtime.   divalproex 250 MG 24 hr tablet Commonly known as:  DEPAKOTE ER Take 2 tablets (500 mg total) by mouth at bedtime.   docusate sodium 100 MG capsule Commonly known as:  COLACE Take 100 mg by mouth at bedtime.   FISH OIL PO Take 1 capsule by mouth daily.   lansoprazole 30 MG capsule Commonly known as:  PREVACID Take 30 mg by mouth 2 (two) times daily before a meal.   oxyCODONE-acetaminophen 5-325 MG tablet Commonly known as:  PERCOCET/ROXICET Take 1-2 tablets by mouth every 4 (four) hours as needed for severe pain (moderate to severe pain (when tolerating fluids)).   PHAZYME MAXIMUM STRENGTH 250 MG Caps Generic drug:  Simethicone Take 250 mg by mouth daily as needed (gas).   polyethylene glycol packet Commonly known as:  MIRALAX / GLYCOLAX Take 17 g by mouth at bedtime.   SUMAtriptan 100 MG tablet Commonly known as:  IMITREX Take 1 tablet.  May repeat once in 2 hours if headache persists or recurs. What changed:  how much to take  how to take this  when to take this  reasons to take this  additional instructions   XIIDRA 5 % Soln Generic drug:  Lifitegrast Place 1 drop into both eyes 2 (two) times daily.            Discharge Care Instructions        Start     Ordered   04/25/17 0000  oxyCODONE-acetaminophen (PERCOCET/ROXICET) 5-325 MG tablet  Every 4 hours PRN     04/25/17 0810     Follow-up Information    Patton Salles, MD In 2 days.   Specialty:  Obstetrics and Gynecology Contact information: [address] [place] [address] [phone]           Signed: Melony Overly 05/02/2017, 6:33 AM

## 2017-05-02 NOTE — Patient Instructions (Signed)
You make take Tylenol 650 mg by mouth every 4 - 6 hours as needed for pain.  Start reducing your Percocet.  Take one tonight if you need it, but then see if you can use just once daily.

## 2017-05-02 NOTE — Progress Notes (Signed)
GYNECOLOGY  VISIT   HPI: 66 y.o.   Widowed  Caucasian  female   G2P1002 with Patient's last menstrual period was 08/07/2001 (approximate).   here for LAPAROSCOPIC ASSISTED VAGINAL HYSTERECTOMY WITH SALPINGO OOPHORECTOMY and pelvic washings (Bilateral ) ANTERIOR (CYSTOCELE) AND POSTERIOR (RECTOCELE) REPAIR (N/A ) CYSTOSCOPY (N/A ) EXTENSIVE LAPAROSCOPIC LYSIS OF ADHESIONS (N/A Abdomen).   Patient complaining of lower pelvic pain and lower back pain. No urinary symptoms. Today having increased pain.  Thinks she needs more oxycodone.  Taking one pill twice a day.   Walking 1/2 - 1 mild per day! Having normal BMs with Miralax and Colace daily.  Normal food.  Voiding well.  Spotting is decreasing.  Taking showers.   Wonders if she needs a new mattress.  Urine ZOX:[id number] RBCs, 2+WBCs(Pt.states still spotting a little) Temp:98.3    GYNECOLOGIC HISTORY: Patient's last menstrual period was 08/07/2001 (approximate). Contraception: Postmenopausal/Hysterectomy Menopausal hormone therapy:  n/a Last mammogram: 07/26/16 Density B/BIRADS1:neg: TBC Last pap smear: : 01/17/17 negative - done with Dr. Kevan Ny 07/17/16 CIN1/LSIL. HR HPV:Neg        OB History    Gravida Para Term Preterm AB Living   SAB TAB Ectopic Multiple Live Births           2         Patient Active Problem List   Diagnosis Date Noted  . Status post laparoscopy-assisted vaginal hysterectomy 04/24/2017  . Dry eye syndrome 03/17/2016  . Headache 03/17/2016  . Hyperlipidemia 03/17/2016  . Lack of bladder control 03/17/2016  . Migraines 03/17/2016  . 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 Abnormal Pap smear of cervix 07/2016   CIN1  . Constipation   . Depression   . Diverticulosis   . H. pylori infection   . History of blood transfusion 1984  . Hx of colonic polyps   .  Hypercholesteremia   . Hyponatremia   . Migraines   . Vitamin D deficiency     Past Surgical History:  Procedure Laterality Date  . ANTERIOR AND POSTERIOR REPAIR N/A 04/24/2017   Procedure: ANTERIOR (CYSTOCELE) AND POSTERIOR (RECTOCELE)  REPAIR;  Surgeon: Patton Salles, MD;  Location: [facility];  Service: Gynecology;  Laterality: N/A;  . CESAREAN SECTION  1984  . COLONOSCOPY  2012  . COSMETIC SURGERY     Neck lift  . CYSTOSCOPY N/A 04/24/2017   Procedure: CYSTOSCOPY;  Surgeon: Patton Salles, MD;  Location: [facility];  Service: Gynecology;  Laterality: N/A;  . HEMORRHOID SURGERY  1984  . LAPAROSCOPIC LYSIS OF ADHESIONS N/A 04/24/2017   Procedure: EXTENSIVE LAPAROSCOPIC LYSIS OF ADHESIONS;  Surgeon: Patton Salles, MD;  Location: [facility];  Service: Gynecology;  Laterality: N/A;  . LAPAROSCOPIC VAGINAL HYSTERECTOMY WITH SALPINGO OOPHORECTOMY Bilateral 04/24/2017   Procedure: LAPAROSCOPIC ASSISTED VAGINAL HYSTERECTOMY WITH SALPINGO OOPHORECTOMY and pelvic washings;  Surgeon: Patton Salles, MD;  Location: [facility];  Service: Gynecology;  Laterality: Bilateral;  3 hours  . RECTAL PROLAPSE REPAIR     Dr. Kendrick Ranch  . UMBILICAL HERNIA REPAIR     had surgery as a child.    Current Outpatient Prescriptions  Medication Sig Dispense Refill  . acetaminophen (TYLENOL) 325 MG tablet Take 325 mg by mouth every 6 (six) hours as needed.     [name]  chlordiazePOXIDE (LIBRIUM) 10 MG capsule Take 20 mg by mouth at bedtime.     . cholecalciferol (VITAMIN D) 1000 units tablet Take 1,000 Units by mouth at bedtime.    . divalproex (DEPAKOTE ER) 250 MG 24 hr tablet Take 2 tablets (500 mg total) by mouth at bedtime. 60 tablet 5  . docusate sodium (COLACE) 100 MG capsule Take 100 mg by mouth at bedtime.     . lansoprazole (PREVACID) 30 MG capsule Take 30 mg by mouth 2 (two) times daily before a meal.     . Omega-3 Fatty Acids (FISH OIL PO) Take 1 capsule by mouth daily.    [name]  oxyCODONE-acetaminophen (PERCOCET/ROXICET) 5-325 MG tablet Take 1-2 tablets by mouth every 4 (four) hours as needed for severe pain (moderate to severe pain (when tolerating fluids)). 30 tablet 0  . polyethylene glycol (MIRALAX / GLYCOLAX) packet Take 17 g by mouth at bedtime.     . Simethicone (PHAZYME MAXIMUM STRENGTH) 250 MG CAPS Take 250 mg by mouth daily as needed (gas).    . SUMAtriptan (IMITREX) 100 MG tablet Take 1 tablet.  May repeat once in 2 hours if headache persists or recurs. (Patient taking differently: Take 100 mg by mouth every 2 (two) hours as needed. Take 1 tablet.  May repeat once in 2 hours if headache persists or recurs.) 10 tablet 2  . XIIDRA 5 % SOLN Place 1 drop into both eyes 2 (two) times daily.      No current facility-administered medications for this visit.      ALLERGIES: Aspirin; Crestor [rosuvastatin calcium]; Inderal [propranolol]; Other; Relpax [eletriptan hydrobromide]; Salicylates; and Topamax [topiramate]  Family History  Problem Relation Age of Onset  . Migraines Mother   . Migraines Sister   . Cancer Father        Prostate    Social History   Social History  . Marital status: Widowed    Spouse name: N/A  . Number of children: N/A  . Years of education: N/A   Occupational History  . LPN    Social History Main Topics  . Smoking status: Never Smoker  . Smokeless tobacco: Never Used  . Alcohol use No  . Drug use: No  . Sexual activity: Yes    Birth control/ protection: Post-menopausal, Surgical     Comment: Hyst   Other Topics Concern  . Not on file   Social History Narrative   Pt has L.P.N through [facility]    ROS:  Pertinent items are noted in HPI.  PHYSICAL EXAMINATION:    BP 120/76 (BP Location: Right Arm, Patient Position: Sitting, Cuff Size: Normal)   Pulse 84   Temp 98.3 F (36.8 C) (Oral)   Ht 4' 11.5" (1.511 m)   Wt 130 lb 12.8 oz (59.3 kg)   LMP 08/07/2001 (Approximate)   BMI 25.98 kg/m     General appearance: alert,  cooperative and appears stated age   Abdomen: incisions intact. LLQ incision with small induration and ecchymoses. Soft, non-tender, no masses,  no organomegaly   Bimanual Exam:  Uterus:  Absent.  Suture lines intact.  No agglutination of vaginal sutures.  Chaperone was present for exam.  ASSESSMENT  Post op pain.  Doing too much activity! Positive urine dip.  This is likely from vaginal drainage.  PLAN  Will check urine micro and culture. No abx at this time. Reduce activity level but be up several times a day.  Reduce use of oxycodone.  Tylenol 650 mg  po q 4 - 6 hours prn.  Follow up for 6 week check, sooner if needed.  An After Visit Summary was printed and given to the patient.

## 2017-05-02 NOTE — Telephone Encounter (Signed)
Patient is asking for a nurse to call her and let her know when her prescription has been sent to her pharmacy. Patient is asking if she will need to be seen for an appointment?

## 2017-05-03 ENCOUNTER — Encounter: Payer: Self-pay | Admitting: Obstetrics and Gynecology

## 2017-05-03 LAB — URINE CULTURE

## 2017-05-03 LAB — URINALYSIS, MICROSCOPIC ONLY: Casts: NONE SEEN /lpf

## 2017-05-05 ENCOUNTER — Encounter: Payer: Self-pay | Admitting: Obstetrics and Gynecology

## 2017-05-07 ENCOUNTER — Ambulatory Visit: Payer: Medicare Other | Admitting: Obstetrics and Gynecology

## 2017-05-10 ENCOUNTER — Ambulatory Visit (INDEPENDENT_AMBULATORY_CARE_PROVIDER_SITE_OTHER): Payer: Medicare Other | Admitting: Obstetrics and Gynecology

## 2017-05-10 ENCOUNTER — Telehealth: Payer: Self-pay | Admitting: Obstetrics and Gynecology

## 2017-05-10 ENCOUNTER — Encounter: Payer: Self-pay | Admitting: Obstetrics and Gynecology

## 2017-05-10 VITALS — BP 112/74 | HR 80 | Ht 59.5 in | Wt 130.6 lb

## 2017-05-10 DIAGNOSIS — M545 Low back pain, unspecified: Secondary | ICD-10-CM

## 2017-05-10 DIAGNOSIS — N898 Other specified noninflammatory disorders of vagina: Secondary | ICD-10-CM | POA: Diagnosis not present

## 2017-05-10 DIAGNOSIS — R8290 Unspecified abnormal findings in urine: Secondary | ICD-10-CM | POA: Diagnosis not present

## 2017-05-10 LAB — POCT URINALYSIS DIPSTICK
Bilirubin, UA: NEGATIVE
GLUCOSE UA: NEGATIVE
KETONES UA: NEGATIVE
Nitrite, UA: NEGATIVE
Protein, UA: NEGATIVE
UROBILINOGEN UA: 0.2 U/dL
pH, UA: 6 (ref 5.0–8.0)

## 2017-05-10 MED ORDER — METRONIDAZOLE 500 MG PO TABS: 500.0000 mg | ORAL_TABLET | Freq: Two times a day (BID) | ORAL | 0 refills | Status: DC

## 2017-05-10 NOTE — Progress Notes (Signed)
GYNECOLOGY  VISIT   HPI: 66 y.o.   Widowed  Caucasian  female   G2P1002 with Patient's last menstrual period was 08/07/2001 (approximate).   here for severe lower back pain 2 weeks status post LAPAROSCOPIC ASSISTED VAGINAL HYSTERECTOMY WITH SALPINGO OOPHORECTOMY and pelvic washings (Bilateral ) ANTERIOR (CYSTOCELE) AND POSTERIOR (RECTOCELE) REPAIR (N/A )CYSTOSCOPY (N/A ) EXTENSIVE LAPAROSCOPIC LYSIS OF ADHESIONS  (N/A Abdomen).  Patient complaining of lower back pain since last office visit and states progressively getting worse. She does have a history of back pain prior to surgery. Pain is mainly in center of back to lower back. She is not sleeping. She is having normal BMs. She denies any dysuria. Patient very emotional today.   Crying all the time.  Feeling pain and nauseated from the pain.  Ice helps the pain.  Has Percocet at home, still has 5. She decided not to take any more.  Taking 625 mg of Tylenol every 4 hours.   Abdomen is much less sore.  Feeling good from an abdominal standpoint.  Having some spotting. Having BMs. Voiding well.  No dysuria.   Temp: 98.0  GYNECOLOGIC HISTORY: Patient's last menstrual period was 08/07/2001 (approximate). Contraception:  Postmenopausal/Hysterectomy Menopausal hormone therapy: n/a Last mammogram: 07/26/16 Density B/BIRADS1:neg: TBC Last pap smear: : 01/17/17 negative - done with Dr. Kevan Ny 07/17/16 CIN1/LSIL. HR HPV:Neg        OB History    Gravida Para Term Preterm AB Living   SAB TAB Ectopic Multiple Live Births           2         Patient Active Problem List   Diagnosis Date Noted  . Status post laparoscopy-assisted vaginal hysterectomy 04/24/2017  . Dry eye syndrome 03/17/2016  . Headache 03/17/2016  . Hyperlipidemia 03/17/2016  . Lack of bladder control 03/17/2016  . Migraines 03/17/2016  . 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 Abnormal Pap smear of cervix 07/2016   CIN1  . Constipation   . Depression   . Diverticulosis   . H. pylori infection   . History of blood transfusion 1984  . Hx of colonic polyps   . Hypercholesteremia   . Hyponatremia   . Migraines   . Vitamin D deficiency     Past Surgical History:  Procedure Laterality Date  . ANTERIOR AND POSTERIOR REPAIR N/A 04/24/2017   Procedure: ANTERIOR (CYSTOCELE) AND POSTERIOR (RECTOCELE)  REPAIR;  Surgeon: Patton Salles, MD;  Location: [facility];  Service: Gynecology;  Laterality: N/A;  . CESAREAN SECTION  1984  . COLONOSCOPY  2012  . COSMETIC SURGERY     Neck lift  . CYSTOSCOPY N/A 04/24/2017   Procedure: CYSTOSCOPY;  Surgeon: Patton Salles, MD;  Location: [facility];  Service: Gynecology;  Laterality: N/A;  . HEMORRHOID SURGERY  1984  . LAPAROSCOPIC LYSIS OF ADHESIONS N/A 04/24/2017   Procedure: EXTENSIVE LAPAROSCOPIC LYSIS OF ADHESIONS;  Surgeon: Patton Salles, MD;  Location: [facility];  Service: Gynecology;  Laterality: N/A;  . LAPAROSCOPIC VAGINAL HYSTERECTOMY WITH SALPINGO OOPHORECTOMY Bilateral 04/24/2017   Procedure: LAPAROSCOPIC ASSISTED VAGINAL HYSTERECTOMY WITH SALPINGO OOPHORECTOMY and pelvic washings;  Surgeon: Patton Salles, MD;  Location: [facility];  Service: Gynecology;  Laterality: Bilateral;  3 hours  . RECTAL PROLAPSE REPAIR  Dr. Kendrick Ranch  . UMBILICAL HERNIA REPAIR     had surgery as a child.    Current Outpatient Prescriptions  Medication Sig Dispense Refill  . acetaminophen (TYLENOL) 325 MG tablet Take 325 mg by mouth every 6 (six) hours as needed.     . chlordiazePOXIDE (LIBRIUM) 10 MG capsule Take 20 mg by mouth at bedtime.     . cholecalciferol (VITAMIN D) 1000 units tablet Take 1,000 Units by mouth at bedtime.    . divalproex (DEPAKOTE ER) 250 MG 24 hr tablet Take 2 tablets (500 mg total) by mouth at bedtime. 60 tablet 5  . docusate sodium  (COLACE) 100 MG capsule Take 100 mg by mouth at bedtime.     . lansoprazole (PREVACID) 30 MG capsule Take 30 mg by mouth 2 (two) times daily before a meal.     . Omega-3 Fatty Acids (FISH OIL PO) Take 1 capsule by mouth daily.    . polyethylene glycol (MIRALAX / GLYCOLAX) packet Take 17 g by mouth at bedtime.     . Simethicone (PHAZYME MAXIMUM STRENGTH) 250 MG CAPS Take 250 mg by mouth daily as needed (gas).    . SUMAtriptan (IMITREX) 100 MG tablet Take 1 tablet.  May repeat once in 2 hours if headache persists or recurs. (Patient taking differently: Take 100 mg by mouth every 2 (two) hours as needed. Take 1 tablet.  May repeat once in 2 hours if headache persists or recurs.) 10 tablet 2  . XIIDRA 5 % SOLN Place 1 drop into both eyes 2 (two) times daily.     [name] oxyCODONE-acetaminophen (PERCOCET/ROXICET) 5-325 MG tablet Take 1-2 tablets by mouth every 4 (four) hours as needed for severe pain (moderate to severe pain (when tolerating fluids)). (Patient not taking: Reported on 05/10/2017) 30 tablet 0   No current facility-administered medications for this visit.      ALLERGIES: Aspirin; Crestor [rosuvastatin calcium]; Inderal [propranolol]; Other; Relpax [eletriptan hydrobromide]; Salicylates; and Topamax [topiramate]  Family History  Problem Relation Age of Onset  . Migraines Mother   . Migraines Sister   . Cancer Father        Prostate    Social History   Social History  . Marital status: Widowed    Spouse name: N/A  . Number of children: N/A  . Years of education: N/A   Occupational History  . LPN    Social History Main Topics  . Smoking status: Never Smoker  . Smokeless tobacco: Never Used  . Alcohol use No  . Drug use: No  . Sexual activity: Yes    Birth control/ protection: Post-menopausal, Surgical     Comment: Hyst   Other Topics Concern  . Not on file   Social History Narrative   Pt has L.P.N through [facility]    ROS:  Pertinent items are noted in HPI.  PHYSICAL  EXAMINATION:    BP 112/74 (BP Location: Right Arm, Patient Position: Sitting, Cuff Size: Normal)   Pulse 80   Ht 4' 11.5" (1.511 m)   Wt 130 lb 9.6 oz (59.2 kg)   LMP 08/07/2001 (Approximate)   BMI 25.94 kg/m     General appearance: alert, cooperative and appears stated age.  Tearful.  Head: Normocephalic, without obvious abnormality, atraumatic Lungs: clear to auscultation bilaterally Heart: regular rate and rhythm Abdomen: incisions intact. Abdomen is soft, non-tender, no masses,  no organomegaly. Back:  No CVA tenderness.  No point tenderness in lower abdomen.    Pelvic: External  genitalia:  no lesions              Urethra:  normal appearing urethra with no masses, tenderness or lesions              Bartholins and Skenes: normal                 Vagina: normal appearing vagina with normal color and discharge, no lesions.  Suture lines present. Odor noted.              Cervix:  Absent.                 Bimanual Exam:  Uterus:   Absent.                Adnexa: no mass, fullness, tenderness          Chaperone was present for exam.  ASSESSMENT  Low back pain.  I do not think this is surgically related.  I think she may have an underlying musculoskeletal issue that has been undiagnosed. Vaginal odor.   PLAN  Urine dip and possible culture, CBC, CMP.  Flagyl 500 mg po bid x 7 days. See PCP for back pain. She will call today.  Ok for Tylenol. Follow up for 6 week post op visit.    An After Visit Summary was printed and given to the patient.  __15____ minutes face to face time of which over 50% was spent in counseling.

## 2017-05-10 NOTE — Telephone Encounter (Signed)
Patient is a post op and having severe back pain.

## 2017-05-10 NOTE — Telephone Encounter (Signed)
Spoke with patient. Patient is very tearful. Patient had a laparoscopic vaginal hysterectomy on 04/24/2017. States she is having sever lower back pain. Is taking 625 mg of tylenol every 4 hours without relief. Denies any pain with urination, fever, or chills. Is applying ice to lower back for relief. Has been unable to sleep due to pain. Has not performed any heavy lifting or movements that may have strained her back. Unable to be seen until 3 pm as she does not have a car and will need a ride to the office. Advised will speak with Dr.Silva and return call.  Return call to patient after review with Dr.Silva. Appointment scheduled for today 05/10/2017 at 3 pm with Dr.Silva. Patient is agreeable to date and time.  Routing to provider for final review. Patient agreeable to disposition. Will close encounter.

## 2017-05-11 LAB — CBC
HEMATOCRIT: 35.5 % (ref 34.0–46.6)
Hemoglobin: 12 g/dL (ref 11.1–15.9)
MCH: 32.2 pg (ref 26.6–33.0)
MCHC: 33.8 g/dL (ref 31.5–35.7)
MCV: 95 fL (ref 79–97)
Platelets: 324 10*3/uL (ref 150–379)
RBC: 3.73 x10E6/uL — ABNORMAL LOW (ref 3.77–5.28)
RDW: 13.1 % (ref 12.3–15.4)
WBC: 7.1 10*3/uL (ref 3.4–10.8)

## 2017-05-11 LAB — COMPREHENSIVE METABOLIC PANEL
A/G RATIO: 2 (ref 1.2–2.2)
ALBUMIN: 4.3 g/dL (ref 3.6–4.8)
ALT: 7 IU/L (ref 0–32)
AST: 13 IU/L (ref 0–40)
Alkaline Phosphatase: 53 IU/L (ref 39–117)
BILIRUBIN TOTAL: 0.3 mg/dL (ref 0.0–1.2)
BUN / CREAT RATIO: 22 (ref 12–28)
BUN: 13 mg/dL (ref 8–27)
CHLORIDE: 96 mmol/L (ref 96–106)
CO2: 21 mmol/L (ref 20–29)
Calcium: 9.5 mg/dL (ref 8.7–10.3)
Creatinine, Ser: 0.59 mg/dL (ref 0.57–1.00)
GFR calc non Af Amer: 96 mL/min/{1.73_m2} (ref >59)
GFR, EST AFRICAN AMERICAN: 111 mL/min/{1.73_m2} (ref >59)
Globulin, Total: 2.1 g/dL (ref 1.5–4.5)
Glucose: 104 mg/dL — ABNORMAL HIGH (ref 65–99)
POTASSIUM: 4.1 mmol/L (ref 3.5–5.2)
Sodium: 131 mmol/L — ABNORMAL LOW (ref 134–144)
TOTAL PROTEIN: 6.4 g/dL (ref 6.0–8.5)

## 2017-05-11 LAB — URINE CULTURE: Organism ID, Bacteria: NO GROWTH

## 2017-05-11 LAB — URINALYSIS, MICROSCOPIC ONLY: Casts: NONE SEEN /lpf

## 2017-05-17 ENCOUNTER — Other Ambulatory Visit: Payer: Self-pay | Admitting: Family Medicine

## 2017-05-17 ENCOUNTER — Ambulatory Visit
Admission: RE | Admit: 2017-05-17 | Discharge: 2017-05-17 | Disposition: A | Discharge status: Home / Self Care | Payer: Medicare Other | Source: Ambulatory Visit | Attending: Family Medicine | Admitting: Family Medicine

## 2017-05-17 DIAGNOSIS — M5440 Lumbago with sciatica, unspecified side: Secondary | ICD-10-CM

## 2017-05-21 ENCOUNTER — Other Ambulatory Visit (INDEPENDENT_AMBULATORY_CARE_PROVIDER_SITE_OTHER): Payer: Medicare Other

## 2017-05-21 ENCOUNTER — Telehealth: Payer: Self-pay | Admitting: Obstetrics and Gynecology

## 2017-05-21 DIAGNOSIS — G43009 Migraine without aura, not intractable, without status migrainosus: Secondary | ICD-10-CM

## 2017-05-21 DIAGNOSIS — G44219 Episodic tension-type headache, not intractable: Secondary | ICD-10-CM

## 2017-05-21 LAB — CBC WITH DIFFERENTIAL/PLATELET
Basophils Absolute: 0.1 10*3/uL (ref 0.0–0.1)
Basophils Relative: 1.7 % (ref 0.0–3.0)
EOS PCT: 3.6 % (ref 0.0–5.0)
Eosinophils Absolute: 0.2 10*3/uL (ref 0.0–0.7)
HCT: 34.8 % — ABNORMAL LOW (ref 36.0–46.0)
HEMOGLOBIN: 12.1 g/dL (ref 12.0–15.0)
Lymphocytes Relative: 28.2 % (ref 12.0–46.0)
Lymphs Abs: 1.4 10*3/uL (ref 0.7–4.0)
MCHC: 34.9 g/dL (ref 30.0–36.0)
MCV: 96.9 fl (ref 78.0–100.0)
MONO ABS: 0.4 10*3/uL (ref 0.1–1.0)
MONOS PCT: 9.2 % (ref 3.0–12.0)
Neutro Abs: 2.8 10*3/uL (ref 1.4–7.7)
Neutrophils Relative %: 57.3 % (ref 43.0–77.0)
Platelets: 245 10*3/uL (ref 150.0–400.0)
RBC: 3.59 Mil/uL — AB (ref 3.87–5.11)
RDW: 12.8 % (ref 11.5–15.5)
WBC: 4.8 10*3/uL (ref 4.0–10.5)

## 2017-05-21 LAB — COMPREHENSIVE METABOLIC PANEL
ALBUMIN: 4.2 g/dL (ref 3.5–5.2)
ALK PHOS: 43 U/L (ref 39–117)
ALT: 11 U/L (ref 0–35)
AST: 13 U/L (ref 0–37)
BUN: 16 mg/dL (ref 6–23)
CO2: 27 meq/L (ref 19–32)
Calcium: 9.2 mg/dL (ref 8.4–10.5)
Chloride: 97 mEq/L (ref 96–112)
Creatinine, Ser: 0.55 mg/dL (ref 0.40–1.20)
GFR: 117.68 mL/min (ref >60.00)
GLUCOSE: 96 mg/dL (ref 70–99)
Potassium: 3.9 mEq/L (ref 3.5–5.1)
SODIUM: 130 meq/L — AB (ref 135–145)
TOTAL PROTEIN: 6.6 g/dL (ref 6.0–8.3)
Total Bilirubin: 0.5 mg/dL (ref 0.2–1.2)

## 2017-05-21 NOTE — Telephone Encounter (Signed)
Left detailed message per patient request. Advised OV needed, please return call to office at [phone] to schedule.

## 2017-05-21 NOTE — Telephone Encounter (Signed)
Patient scheduled for 05/23/17 at 1:00pm with Dr.Silva.

## 2017-05-21 NOTE — Telephone Encounter (Signed)
Routing to Dr. Silva FYI, will close encounter.  

## 2017-05-21 NOTE — Telephone Encounter (Signed)
Patient needs office visit for this type of prescription.

## 2017-05-21 NOTE — Telephone Encounter (Signed)
Patient is post op and finish her Flagyl prescription. Patient is still having symptoms and is asking for a refill.  [employer] [employer]. Patient is asking to have this delivered.

## 2017-05-21 NOTE — Telephone Encounter (Signed)
Spoke with patient, requesting RX for Flagyl. Patient s/p laparoscopic vaginal hysterectomy on 04/24/17. Patient reports symptoms of white vaginal discharge, "slight odor" since completing antibiotic on 10/12. Denies urinary complaints, bleeding, pain or itching.  F/u with PCP- Dr. Kevan Ny for lower back pain on 10/5, started tramadol and muscle relaxer, states pain has improved.  Advised patient OV recommended for further evaluation, patient declined. States she is taking tramadol and is unable to drive. Will see Dr. Edward Jolly on 10/30 for f/u.   Advised patient will review with Dr. Edward Jolly and return call with any additional recommendations. Patient states she has another appointment at 1:15pm, please leave detailed message on voicemail. Confirmed [facility] as pharmacy.   Dr. Edward Jolly- please review and advise?

## 2017-05-23 ENCOUNTER — Ambulatory Visit (INDEPENDENT_AMBULATORY_CARE_PROVIDER_SITE_OTHER): Payer: Medicare Other | Admitting: Obstetrics and Gynecology

## 2017-05-23 ENCOUNTER — Encounter: Payer: Self-pay | Admitting: Obstetrics and Gynecology

## 2017-05-23 VITALS — BP 118/70 | HR 72 | Resp 16 | Wt 139.0 lb

## 2017-05-23 DIAGNOSIS — N898 Other specified noninflammatory disorders of vagina: Secondary | ICD-10-CM

## 2017-05-23 NOTE — Progress Notes (Signed)
GYNECOLOGY  VISIT   HPI: 66 y.o.   Widowed  Caucasian  female   G2P1002 with Patient's last menstrual period was 08/07/2001 (approximate).   here for  Vaginal discharge.  Finished Flagyl course and vaginal odor has now returned.  Having discharge.  No vaginal bleeding.   Had prolapse surgery one month ago.  Hot flashes?  Notes fatigue.  Hgb 12.1 and WBC 4.8 on 05/21/17.  Saw her PCP regarding her low back pain.  Had x-ray of spine and has DJD of lower spine.  Using Tramadol once daily for pain.   GYNECOLOGIC HISTORY: Patient's last menstrual period was 08/07/2001 (approximate). Contraception:  Postmenopausal/Hysterectomy Menopausal hormone therapy:  none Last mammogram:  07/26/16 Density B/BIRADS1:neg: TBC Last pap smear:   01/17/17 negative - done with Dr. Kevan Ny 07/17/16 CIN1/LSIL. HR HPV:Neg        OB History    Gravida Para Term Preterm AB Living   2 2 1     2    SAB TAB Ectopic Multiple Live Births           2         Patient Active Problem List   Diagnosis Date Noted  . Status post laparoscopy-assisted vaginal hysterectomy 04/24/2017  . Dry eye syndrome 03/17/2016  . Headache 03/17/2016  . Hyperlipidemia 03/17/2016  . Lack of bladder control 03/17/2016  . Migraines 03/17/2016  . 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 Abnormal Pap smear of cervix 07/2016   CIN1  . Constipation   . Depression   . Diverticulosis   . H. pylori infection   . History of blood transfusion 1984  . Hx of colonic polyps   . Hypercholesteremia   . Hyponatremia   . Migraines   . Vitamin D deficiency     Past Surgical History:  Procedure Laterality Date  . ANTERIOR AND POSTERIOR REPAIR N/A 04/24/2017   Procedure: ANTERIOR (CYSTOCELE) AND POSTERIOR (RECTOCELE)  REPAIR;  Surgeon: Patton Salles, MD;  Location: [facility];  Service: Gynecology;  Laterality: N/A;   . CESAREAN SECTION  1984  . COLONOSCOPY  2012  . COSMETIC SURGERY     Neck lift  . CYSTOSCOPY N/A 04/24/2017   Procedure: CYSTOSCOPY;  Surgeon: Patton Salles, MD;  Location: [facility];  Service: Gynecology;  Laterality: N/A;  . HEMORRHOID SURGERY  1984  . LAPAROSCOPIC LYSIS OF ADHESIONS N/A 04/24/2017   Procedure: EXTENSIVE LAPAROSCOPIC LYSIS OF ADHESIONS;  Surgeon: Patton Salles, MD;  Location: [facility];  Service: Gynecology;  Laterality: N/A;  . LAPAROSCOPIC VAGINAL HYSTERECTOMY WITH SALPINGO OOPHORECTOMY Bilateral 04/24/2017   Procedure: LAPAROSCOPIC ASSISTED VAGINAL HYSTERECTOMY WITH SALPINGO OOPHORECTOMY and pelvic washings;  Surgeon: Patton Salles, MD;  Location: [facility];  Service: Gynecology;  Laterality: Bilateral;  3 hours  . RECTAL PROLAPSE REPAIR     Dr. Kendrick Ranch  . UMBILICAL HERNIA REPAIR     had surgery as a child.    Current Outpatient Prescriptions  Medication Sig Dispense Refill  . acetaminophen (TYLENOL) 325 MG tablet Take 325 mg by mouth every 6 (six) hours as needed.     . chlordiazePOXIDE (LIBRIUM) 10 MG capsule Take 20 mg by mouth at bedtime.     . cholecalciferol (VITAMIN D) 1000 units tablet Take 1,000 Units by mouth at bedtime.    . divalproex (DEPAKOTE ER) 250 MG 24  hr tablet Take 2 tablets (500 mg total) by mouth at bedtime. 60 tablet 5  . docusate sodium (COLACE) 100 MG capsule Take 100 mg by mouth at bedtime.     . lansoprazole (PREVACID) 30 MG capsule Take 30 mg by mouth 2 (two) times daily before a meal.     . Omega-3 Fatty Acids (FISH OIL PO) Take 1 capsule by mouth daily.    [name]. oxyCODONE-acetaminophen (PERCOCET/ROXICET) 5-325 MG tablet Take 1-2 tablets by mouth every 4 (four) hours as needed for severe pain (moderate to severe pain (when tolerating fluids)). 30 tablet 0  . polyethylene glycol (MIRALAX / GLYCOLAX) packet Take 17 g by mouth at bedtime.     . Simethicone (PHAZYME MAXIMUM STRENGTH) 250 MG CAPS Take 250 mg by mouth  daily as needed (gas).    . SUMAtriptan (IMITREX) 100 MG tablet Take 1 tablet.  May repeat once in 2 hours if headache persists or recurs. (Patient taking differently: Take 100 mg by mouth every 2 (two) hours as needed. Take 1 tablet.  May repeat once in 2 hours if headache persists or recurs.) 10 tablet 2  . traMADol (ULTRAM) 50 MG tablet Take 50 mg by mouth daily.    [name]. XIIDRA 5 % SOLN Place 1 drop into both eyes 2 (two) times daily.      No current facility-administered medications for this visit.      ALLERGIES: Aspirin; Crestor [rosuvastatin calcium]; Inderal [propranolol]; Other; Relpax [eletriptan hydrobromide]; Salicylates; and Topamax [topiramate]  Family History  Problem Relation Age of Onset  . Migraines Mother   . Migraines Sister   . Cancer Father        Prostate    Social History   Social History  . Marital status: Widowed    Spouse name: N/A  . Number of children: N/A  . Years of education: N/A   Occupational History  . LPN    Social History Main Topics  . Smoking status: Never Smoker  . Smokeless tobacco: Never Used  . Alcohol use No  . Drug use: No  . Sexual activity: Yes    Birth control/ protection: Post-menopausal, Surgical     Comment: Hyst   Other Topics Concern  . Not on file   Social History Narrative   Pt has L.P.N through [facility]    ROS:  Pertinent items are noted in HPI.  PHYSICAL EXAMINATION:    BP 118/70 (BP Location: Right Arm, Patient Position: Sitting, Cuff Size: Normal)   Pulse 72   Resp 16   Wt 139 lb (63 kg)   LMP 08/07/2001 (Approximate)   BMI 27.60 kg/m     General appearance: alert, cooperative and appears stated age   Pelvic: External genitalia:  no lesions              Urethra:  normal appearing urethra with no masses, tenderness or lesions              Bartholins and Skenes: normal                 Vagina:  Suture lines intact.  Some frothy yellow discharge.  Excellent support.              Cervix:  Absent.                 Bimanual Exam:  Uterus:  Absent.              Adnexa: no mass, fullness, tenderness  Chaperone was present for exam.  ASSESSMENT  Status post LAVH/BSO/ant and post colporrhaphy. Vaginal discharge.  Back pain.  Taking Tramadol.  PLAN  Affirm taken.  Will wait for results to treat.  Follow up for 6 week visit.  Reassured regarding fatigue as part of normal post op course.    An After Visit Summary was printed and given to the patient.

## 2017-05-24 ENCOUNTER — Other Ambulatory Visit: Payer: Self-pay | Admitting: *Deleted

## 2017-05-24 LAB — VAGINITIS/VAGINOSIS, DNA PROBE
CANDIDA SPECIES: NEGATIVE
Gardnerella vaginalis: POSITIVE — AB
TRICHOMONAS VAG: NEGATIVE

## 2017-05-24 MED ORDER — METRONIDAZOLE 500 MG PO TABS: 500.0000 mg | ORAL_TABLET | Freq: Two times a day (BID) | ORAL | 0 refills | Status: DC

## 2017-05-25 ENCOUNTER — Telehealth: Payer: Self-pay

## 2017-05-25 NOTE — Telephone Encounter (Signed)
-----   Message from Drema DallasAdam R Jaffe, DO sent at 05/24/2017  7:14 AM EDT ----- Overall, labs look okay (platelets, liver function).  Her sodium level is a little low, which seems to be a trend over the past several months and should be monitored by her PCP.

## 2017-05-25 NOTE — Telephone Encounter (Signed)
Called Pt, advsd her of labs, she said her PCP is aware of low Na and believes it to be a natural low reading for the Pt.

## 2017-05-28 ENCOUNTER — Ambulatory Visit (INDEPENDENT_AMBULATORY_CARE_PROVIDER_SITE_OTHER): Payer: Medicare Other | Admitting: Neurology

## 2017-05-28 ENCOUNTER — Encounter: Payer: Self-pay | Admitting: Neurology

## 2017-05-28 VITALS — BP 116/82 | HR 69 | Ht 59.0 in | Wt 129.4 lb

## 2017-05-28 DIAGNOSIS — E871 Hypo-osmolality and hyponatremia: Secondary | ICD-10-CM

## 2017-05-28 DIAGNOSIS — G43009 Migraine without aura, not intractable, without status migrainosus: Secondary | ICD-10-CM | POA: Diagnosis not present

## 2017-05-28 NOTE — Patient Instructions (Signed)
1.  Continue Depakote ER 500mg  daily 2.  Check BMP in 6 months.  Check CBC and CMP in one year 3.  Follow up in one year after repeat labs

## 2017-05-28 NOTE — Progress Notes (Signed)
NEUROLOGY FOLLOW UP OFFICE NOTE  [name] [id number]  HISTORY OF PRESENT ILLNESS: [name] is a 66 year old right-handed woman with depression and anxiety with history of benzodiazepine and barbiturates dependence and gastric ulcer who follows up for migraine.   UPDATE: Migraines are stable. Intensity:  mild to moderate Duration:  2 hours Frequency:  3 times a month Current NSAIDS:  contraindicated Current analgesics:  Tylenol (first line), tramadol (second line, helps relieve pain but does not want to take opioids) Current triptans:  sumatriptan Current anti-emetic:  Zofran 8mg  Current muscle relaxants:  no Current anti-anxiolytic:  Librium Current sleep aide:  Librium Current Antihypertensive medications:  no Current Antidepressant medications:  no Current Anticonvulsant medications:  Depakote ER 500mg  Current Vitamins/Herbal/Supplements:  no Current Antihistamines/Decongestants:  no Other medication:  Estradiol   05/21/17:  CBC with WBC 4.8, HGB 12.1, HCT 34.8, PLT 245; CMP with Na 130, K 3.9, Cl 97, CO2 27, glucose 96, BUN 16, Cr 0.55, t. Bili 0.5, ALP 43, AST 13, ALT 11.   Caffeine:  1 cup 1/2 decaff coffee daily Alcohol:  no Smoker:  no Diet:  hydrates Exercise:  Walks daily Depression/stress:  improved Sleep hygiene:  Poor   HISTORY: Onset:  She has remote history of migraines which were controlled for many years.  In December 2015, her husband unexpectedly passed away, which triggered depression and anxiety.  She subsequently recovered.  However, in early June, she witnessed her boyfriend's ill father pass away.  It triggered flashbacks of her husband's death, which increased depression, anxiety and migraines.  She has significant history of side effects to multiple medications. Location:  Left peri-orbital Quality:  pounding Initial Intensity:  10/10 Aura:  no Prodrome:  no Associated symptoms:  Nausea, photophobia, phonophobia,  osmophobia Initial Duration:  All day Initial Frequency:  15 days per month Triggers/exacerbating factors:  Stress, light Relieving factors:  Tramadol helps relieve it. Activity:  aggravates   Past NSAIDS:  Contraindicated due to bleeding ulcer Past analgesics:  Cannot take ASA products such as Excedrin due to bleeding ulcer Past abortive triptans:  Relpax (took it multiple times but caused chest tightness once, but was effective), sumatriptan (many years ago) Past anti-emetic:  Zofran 8mg  (effective) Past anti-anxiolytic:  benzodiazepines Past antihypertensive medications:  propranolol (chest tightness Past antidepressant medications:  Multiple, such as Cymbalta.  She has had intolerance to multiple antidepressants over the years. Past anticonvulsant medications:  topiramate (chest tightness), Depakote (many years ago) Past vitamins/Herbal/Supplements:  no   Family history of headache:  Mom, sister   MRI and MRA of head from 11/05/10 were personally reviewed and were unremarkable.  PAST MEDICAL HISTORY: Past Medical History:  Diagnosis Date  . Abnormal Pap smear of cervix 07/2016   CIN1  . Constipation   . Depression   . Diverticulosis   . H. pylori infection   . History of blood transfusion 1984  . Hx of colonic polyps   . Hypercholesteremia   . Hyponatremia   . Migraines   . Vitamin D deficiency     MEDICATIONS: Current Outpatient Prescriptions on File Prior to Visit  Medication Sig Dispense Refill  . acetaminophen (TYLENOL) 325 MG tablet Take 325 mg by mouth every 6 (six) hours as needed.     . chlordiazePOXIDE (LIBRIUM) 10 MG capsule Take 20 mg by mouth at bedtime.     . cholecalciferol (VITAMIN D) 1000 units tablet Take 1,000 Units by mouth at bedtime.    . divalproex (  DEPAKOTE ER) 250 MG 24 hr tablet Take 2 tablets (500 mg total) by mouth at bedtime. 60 tablet 5  . docusate sodium (COLACE) 100 MG capsule Take 100 mg by mouth at bedtime.     . lansoprazole  (PREVACID) 30 MG capsule Take 30 mg by mouth 2 (two) times daily before a meal.     . metroNIDAZOLE (FLAGYL) 500 MG tablet Take 1 tablet (500 mg total) by mouth 2 (two) times daily. 14 tablet 0  . Omega-3 Fatty Acids (FISH OIL PO) Take 1 capsule by mouth daily.    [name]. oxyCODONE-acetaminophen (PERCOCET/ROXICET) 5-325 MG tablet Take 1-2 tablets by mouth every 4 (four) hours as needed for severe pain (moderate to severe pain (when tolerating fluids)). (Patient not taking: Reported on 05/28/2017) 30 tablet 0  . polyethylene glycol (MIRALAX / GLYCOLAX) packet Take 17 g by mouth at bedtime.     . Simethicone (PHAZYME MAXIMUM STRENGTH) 250 MG CAPS Take 250 mg by mouth daily as needed (gas).    . SUMAtriptan (IMITREX) 100 MG tablet Take 1 tablet.  May repeat once in 2 hours if headache persists or recurs. (Patient taking differently: Take 100 mg by mouth every 2 (two) hours as needed. Take 1 tablet.  May repeat once in 2 hours if headache persists or recurs.) 10 tablet 2  . traMADol (ULTRAM) 50 MG tablet Take 50 mg by mouth daily.    [name]. XIIDRA 5 % SOLN Place 1 drop into both eyes 2 (two) times daily.      No current facility-administered medications on file prior to visit.     ALLERGIES: Allergies  Allergen Reactions  . Aspirin Other (See Comments)    Hx of bleeding stomach ulcer   . Crestor [Rosuvastatin Calcium] Itching  . Inderal [Propranolol] Other (See Comments)    Chest tightness  . Other     Other reaction(s): Other (See Comments) Cholesterol meds-multiple reactions to multiple meds  . Relpax [Eletriptan Hydrobromide] Other (See Comments)    Chest Tightness   . Salicylates Other (See Comments)    Told not to take due to hx of GI ulcer  . Topamax [Topiramate] Other (See Comments)    Chest tightness    FAMILY HISTORY: Family History  Problem Relation Age of Onset  . Migraines Mother   . Migraines Sister   . Cancer Father        Prostate    SOCIAL HISTORY: Social History    Social History  . Marital status: Widowed    Spouse name: N/A  . Number of children: N/A  . Years of education: N/A   Occupational History  . LPN    Social History Main Topics  . Smoking status: Never Smoker  . Smokeless tobacco: Never Used  . Alcohol use No  . Drug use: No  . Sexual activity: Yes    Birth control/ protection: Post-menopausal, Surgical     Comment: Hyst   Other Topics Concern  . Not on file   Social History Narrative   Pt has L.P.N through [facility]    REVIEW OF SYSTEMS: Constitutional: No fevers, chills, or sweats, no generalized fatigue, change in appetite Eyes: No visual changes, double vision, eye pain Ear, nose and throat: No hearing loss, ear pain, nasal congestion, sore throat Cardiovascular: No chest pain, palpitations Respiratory:  No shortness of breath at rest or with exertion, wheezes GastrointestinaI: No nausea, vomiting, diarrhea, abdominal pain, fecal incontinence Genitourinary:  No dysuria, urinary retention or frequency Musculoskeletal:  Low back pain Integumentary: No rash, pruritus, skin lesions Neurological: as above Psychiatric: No depression, insomnia, anxiety Endocrine: No palpitations, fatigue, diaphoresis, mood swings, change in appetite, change in weight, increased thirst Hematologic/Lymphatic:  No purpura, petechiae. Allergic/Immunologic: no itchy/runny eyes, nasal congestion, recent allergic reactions, rashes  PHYSICAL EXAM: Vitals:   05/28/17 1425  BP: 116/82  Pulse: 69  SpO2: 98%   General: No acute distress.  Patient appears well-groomed.  normal body habitus. Head:  Normocephalic/atraumatic Eyes:  Fundi examined but not visualized Neck: supple, no paraspinal tenderness, full range of motion Heart:  Regular rate and rhythm Lungs:  Clear to auscultation bilaterally Back: No paraspinal tenderness Neurological Exam: alert and oriented to person, place, and time. Attention span and concentration intact, recent and  remote memory intact, fund of knowledge intact.  Speech fluent and not dysarthric, language intact.  CN II-XII intact. Bulk and tone normal, muscle strength 5/5 throughout.  Sensation to light touch  intact.  Deep tendon reflexes 2+ throughout.  Finger to nose testing intact.  Gait normal  IMPRESSION: Migraine controlled Hyponatremia. Side effect of VPA possible.  Stable.  PLAN: 1.  Depakote ER 500mg  daily 2.  Recheck Na level in 6 months.  3.  Recheck CBC and CMP in one year with follow up soon afterward  Shon Millet, DO  CC:  Shaune Pollack, MD

## 2017-06-04 ENCOUNTER — Other Ambulatory Visit: Payer: Self-pay | Admitting: Neurology

## 2017-06-05 ENCOUNTER — Encounter: Payer: Self-pay | Admitting: Obstetrics and Gynecology

## 2017-06-05 ENCOUNTER — Ambulatory Visit (INDEPENDENT_AMBULATORY_CARE_PROVIDER_SITE_OTHER): Payer: Medicare Other | Admitting: Obstetrics and Gynecology

## 2017-06-05 VITALS — BP 114/60 | HR 80 | Resp 16 | Wt 131.0 lb

## 2017-06-05 DIAGNOSIS — F4321 Adjustment disorder with depressed mood: Secondary | ICD-10-CM

## 2017-06-05 DIAGNOSIS — N898 Other specified noninflammatory disorders of vagina: Secondary | ICD-10-CM | POA: Diagnosis not present

## 2017-06-05 DIAGNOSIS — Z9889 Other specified postprocedural states: Secondary | ICD-10-CM

## 2017-06-05 NOTE — Progress Notes (Signed)
GYNECOLOGY  VISIT   HPI: 66 y.o.   Widowed  Caucasian  female   G2P1002 with Patient's last menstrual period was 08/07/2001 (approximate).   here for  6 week post op  LAPAROSCOPIC ASSISTED VAGINAL HYSTERECTOMY WITH SALPINGO OOPHORECTOMY and pelvic washings (Bilateral ) ANTERIOR (CYSTOCELE) AND POSTERIOR (RECTOCELE) REPAIR (N/A )CYSTOSCOPY (N/A ) EXTENSIVE LAPAROSCOPIC LYSIS OF ADHESIONS  (N/A Abdomen).  Patient complains of having some discharge and a vaginal odor. Discharge is improved but noticing some odor.  No vaginal bleeding.  Has had recurrent bacterial vaginosis.  Did 2 recent courses of Flagyl.  Waiting to do physical therapy for her back.  Still taking Tramadol for pain along with Tylenol.  Behind on her colonoscopy. Due to see GI.  Reports crying.  Hx depression and anxiety.  Not suicidal. Not driving yet.  Took Cymbalta in the past through Dr. Evelene Croon.  GYNECOLOGIC HISTORY: Patient's last menstrual period was 08/07/2001 (approximate). Contraception:  Postmenopausal/Hysterectomy Menopausal hormone therapy:  none Last mammogram:  07/26/16 Density B/BIRADS1:neg: TBC Last pap smear:   01/17/17 negative - done with Dr. Kevan Ny 07/17/16 CIN1/LSIL. HR HPV:Neg        OB History    Gravida Para Term Preterm AB Living   2 2 1     2    SAB TAB Ectopic Multiple Live Births           2         Patient Active Problem List   Diagnosis Date Noted  . Status post laparoscopy-assisted vaginal hysterectomy 04/24/2017  . Dry eye syndrome 03/17/2016  . Headache 03/17/2016  . Hyperlipidemia 03/17/2016  . Lack of bladder control 03/17/2016  . Migraines 03/17/2016  . 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 Abnormal Pap smear of cervix 07/2016   CIN1  . Constipation   . Depression   . Diverticulosis   . H. pylori infection   . History of blood transfusion 1984   . Hx of colonic polyps   . Hypercholesteremia   . Hyponatremia   . Migraines   . Vitamin D deficiency     Past Surgical History:  Procedure Laterality Date  . ANTERIOR AND POSTERIOR REPAIR N/A 04/24/2017   Procedure: ANTERIOR (CYSTOCELE) AND POSTERIOR (RECTOCELE)  REPAIR;  Surgeon: Patton Salles, MD;  Location: [facility];  Service: Gynecology;  Laterality: N/A;  . CESAREAN SECTION  1984  . COLONOSCOPY  2012  . COSMETIC SURGERY     Neck lift  . CYSTOSCOPY N/A 04/24/2017   Procedure: CYSTOSCOPY;  Surgeon: Patton Salles, MD;  Location: [facility];  Service: Gynecology;  Laterality: N/A;  . HEMORRHOID SURGERY  1984  . LAPAROSCOPIC LYSIS OF ADHESIONS N/A 04/24/2017   Procedure: EXTENSIVE LAPAROSCOPIC LYSIS OF ADHESIONS;  Surgeon: Patton Salles, MD;  Location: [facility];  Service: Gynecology;  Laterality: N/A;  . LAPAROSCOPIC VAGINAL HYSTERECTOMY WITH SALPINGO OOPHORECTOMY Bilateral 04/24/2017   Procedure: LAPAROSCOPIC ASSISTED VAGINAL HYSTERECTOMY WITH SALPINGO OOPHORECTOMY and pelvic washings;  Surgeon: Patton Salles, MD;  Location: [facility];  Service: Gynecology;  Laterality: Bilateral;  3 hours  . RECTAL PROLAPSE REPAIR     Dr. Kendrick Ranch  . UMBILICAL HERNIA REPAIR     had surgery as a child.    Current Outpatient Prescriptions  Medication Sig Dispense Refill  . acetaminophen (TYLENOL) 325 MG tablet Take 325 mg by mouth  every 6 (six) hours as needed.     . chlordiazePOXIDE (LIBRIUM) 10 MG capsule Take 20 mg by mouth at bedtime.     . cholecalciferol (VITAMIN D) 1000 units tablet Take 1,000 Units by mouth at bedtime.    . divalproex (DEPAKOTE ER) 250 MG 24 hr tablet TAKE 2 TABLETS AT BEDTIME. 60 tablet 0  . docusate sodium (COLACE) 100 MG capsule Take 100 mg by mouth at bedtime.     . lansoprazole (PREVACID) 30 MG capsule Take 30 mg by mouth 2 (two) times daily before a meal.     . Omega-3 Fatty Acids (FISH OIL PO) Take 1 capsule by mouth daily.    .  polyethylene glycol (MIRALAX / GLYCOLAX) packet Take 17 g by mouth at bedtime.     . Simethicone (PHAZYME MAXIMUM STRENGTH) 250 MG CAPS Take 250 mg by mouth daily as needed (gas).    . SUMAtriptan (IMITREX) 100 MG tablet Take 1 tablet.  May repeat once in 2 hours if headache persists or recurs. (Patient taking differently: Take 100 mg by mouth every 2 (two) hours as needed. Take 1 tablet.  May repeat once in 2 hours if headache persists or recurs.) 10 tablet 2  . traMADol (ULTRAM) 50 MG tablet Take 50 mg by mouth daily.    [name] XIIDRA 5 % SOLN Place 1 drop into both eyes 2 (two) times daily.     [name] oxyCODONE-acetaminophen (PERCOCET/ROXICET) 5-325 MG tablet Take 1-2 tablets by mouth every 4 (four) hours as needed for severe pain (moderate to severe pain (when tolerating fluids)). (Patient not taking: Reported on 05/28/2017) 30 tablet 0   No current facility-administered medications for this visit.      ALLERGIES: Aspirin; Crestor [rosuvastatin calcium]; Inderal [propranolol]; Other; Relpax [eletriptan hydrobromide]; Salicylates; and Topamax [topiramate]  Family History  Problem Relation Age of Onset  . Migraines Mother   . Migraines Sister   . Cancer Father        Prostate    Social History   Social History  . Marital status: Widowed    Spouse name: N/A  . Number of children: N/A  . Years of education: N/A   Occupational History  . LPN    Social History Main Topics  . Smoking status: Never Smoker  . Smokeless tobacco: Never Used  . Alcohol use No  . Drug use: No  . Sexual activity: Yes    Birth control/ protection: Post-menopausal, Surgical     Comment: Hyst   Other Topics Concern  . Not on file   Social History Narrative   Pt has L.P.N through [facility]    ROS:  Pertinent items are noted in HPI.  PHYSICAL EXAMINATION:    BP 114/60 (BP Location: Right Arm, Patient Position: Sitting, Cuff Size: Normal)   Pulse 80   Resp 16   Wt 131 lb (59.4 kg)   LMP 08/07/2001  (Approximate)   BMI 26.46 kg/m     General appearance: alert, cooperative and appears stated age    Pelvic: External genitalia:  no lesions              Urethra:  normal appearing urethra with no masses, tenderness or lesions              Bartholins and Skenes: normal                 Vagina: normal appearing vagina with normal color and discharge, no lesions.,  Minimal discharge.  Sutures present.  Cervix:  Absent.                 Bimanual Exam:  Uterus:   Absent.               Adnexa: no mass, fullness, tenderness    Chaperone was present for exam.  ASSESSMENT  Status post LAVH/BSO/ant and post colporrhaphy. Vaginal odor.  May be a mild form of BV.  This is much improved overall. Back pain - chronic.  Situational depression.   PLAN  Affirm done.  I discussed either Flagyl or Metrogel for treatment of BV.  Will await results. We discussed various forms of vaginitis including atrophic vaginitis as a potential cause of vaginal discharge. We reviewed the benefits of Cymbalta to treat depression and chronic pain.  She will reach out to Dr. Evelene CroonKaur regarding tx for depression. Ok for her to walk a mile several times per week.  Follow up for 3 mo post op.   An After Visit Summary was printed and given to the patient.  _15_____ minutes face to face time of which over 50% was spent in counseling.  This was a problem visit as well as a post op visit.

## 2017-06-06 ENCOUNTER — Telehealth: Payer: Self-pay | Admitting: Neurology

## 2017-06-06 ENCOUNTER — Other Ambulatory Visit: Payer: Self-pay | Admitting: *Deleted

## 2017-06-06 LAB — VAGINITIS/VAGINOSIS, DNA PROBE
CANDIDA SPECIES: NEGATIVE
GARDNERELLA VAGINALIS: POSITIVE — AB
TRICHOMONAS VAG: NEGATIVE

## 2017-06-06 MED ORDER — TINIDAZOLE 500 MG PO TABS: 1000.0000 mg | ORAL_TABLET | Freq: Every day | ORAL | 0 refills | Status: DC

## 2017-06-06 NOTE — Telephone Encounter (Signed)
Pt called and said she was not given any refills on her depakote

## 2017-06-06 NOTE — Telephone Encounter (Signed)
Called and talked with Pt, advsd her when she needs a refill, I'll be glad to put additional refills on her Rx

## 2017-06-09 ENCOUNTER — Encounter: Payer: Self-pay | Admitting: Obstetrics and Gynecology

## 2017-06-10 ENCOUNTER — Encounter: Payer: Self-pay | Admitting: Obstetrics and Gynecology

## 2017-06-11 ENCOUNTER — Telehealth: Payer: Self-pay | Admitting: *Deleted

## 2017-06-11 MED ORDER — METRONIDAZOLE 0.75 % VA GEL: 1.0000 | Freq: Every day | VAGINAL | 0 refills | Status: DC

## 2017-06-11 NOTE — Telephone Encounter (Signed)
See telephone encounter dated 08/21/16. 

## 2017-06-11 NOTE — Telephone Encounter (Signed)
Spoke with patient, advised as seen below per Dr. Edward JollySilva. [facility] ETOH precautions reviewed. Patient verbalizes understanding and is agreeable. Will close encounter.

## 2017-06-11 NOTE — Telephone Encounter (Signed)
Spoke with patient. Reports taking 2 days of Tindamax before stopping d/t headache and nausea. Patient states symptoms have resolved, requesting alternative medication. Advised patient would update Dr. Edward JollySilva and return call with recommendations, patient is agreeable.   Dr. Edward JollySilva -please advise?    From Jonathon Bellowsrabtree, [place] H To Patton SallesAmundson C Silva, Brook E, MD Sent 06/10/2017 5:19 PM  Dr Edward JollySilva  Just wanted to let you know I did not take any Trindazole today. (Sunday)  My headache and nausea is just now easing off. I only took it for two days and it made me very nauseated and headache. Is there another truck we can try?  Thank You,  [name]   From Whitney Salinasrabtree, [place] H To Patton SallesAmundson C Silva, Brook E, MD Sent 06/09/2017 9:23 PM  Hi Dr Edward JollySilva ,  I have taken the drug Trinizole,2 tablets for the past 2 days with breakfast.  It has given me a pretty bad headache and nausea. Do you have any suggestions regarding the dosage? Can I take one pill a day, or something like that? I don't want to stop the drug if that will get rid the infection, so  I'm glad to be trying a new drug.  I just thought I would ask your advice.  Thanks,  [name]

## 2017-06-11 NOTE — Telephone Encounter (Signed)
Ok for Metrogel pv at hs x 5 nights.  She is healed enough from the surgery to use an intravaginal treatment.

## 2017-06-11 NOTE — Telephone Encounter (Signed)
See telephone encounter dated 06/11/17.

## 2017-06-19 ENCOUNTER — Other Ambulatory Visit: Payer: Self-pay | Admitting: Family Medicine

## 2017-06-19 DIAGNOSIS — Z1231 Encounter for screening mammogram for malignant neoplasm of breast: Secondary | ICD-10-CM

## 2017-06-21 ENCOUNTER — Telehealth: Payer: Self-pay | Admitting: Obstetrics and Gynecology

## 2017-06-21 NOTE — Telephone Encounter (Signed)
Patient states discharge has returned.  Would like to know if she can get a refill on the Metrogel.

## 2017-06-21 NOTE — Telephone Encounter (Signed)
Spoke with patient, advised as seen below per Dr.S ilva. OV scheduled for 06/22/17 at 1:30pm with Dr. Edward JollySilva. Patient verbalizes understanding and is agreeable.   Routing to provider for final review. Patient is agreeable to disposition. Will close encounter.

## 2017-06-21 NOTE — Telephone Encounter (Signed)
Spoke with patient. Patient states she completed metrogel for BV on 06/15/17, symptoms resolved. Woke up this morning with thick, white, clumpy vaginal d/c. Denies odor, discomfort, burning or itching. Patient requesting RX for Metrogel.   Advised patient would review with Dr. Edward JollySilva and return call with recommendations, patient is agreeable.   Dr. Edward JollySilva -please advise?

## 2017-06-21 NOTE — Telephone Encounter (Signed)
No refill.  Patient needs evaluation for an Rx.

## 2017-06-22 ENCOUNTER — Other Ambulatory Visit: Payer: Self-pay

## 2017-06-22 ENCOUNTER — Encounter: Payer: Self-pay | Admitting: Obstetrics and Gynecology

## 2017-06-22 ENCOUNTER — Ambulatory Visit: Payer: Medicare Other | Admitting: Obstetrics and Gynecology

## 2017-06-22 VITALS — BP 116/80 | HR 74 | Resp 12 | Wt 134.0 lb

## 2017-06-22 DIAGNOSIS — N761 Subacute and chronic vaginitis: Secondary | ICD-10-CM

## 2017-06-22 DIAGNOSIS — R300 Dysuria: Secondary | ICD-10-CM | POA: Diagnosis not present

## 2017-06-22 DIAGNOSIS — R319 Hematuria, unspecified: Secondary | ICD-10-CM

## 2017-06-22 LAB — POCT URINALYSIS DIPSTICK
Bilirubin, UA: NEGATIVE
Glucose, UA: NEGATIVE
KETONES UA: NEGATIVE
LEUKOCYTES UA: NEGATIVE
NITRITE UA: NEGATIVE
PH UA: 6 (ref 5.0–8.0)
PROTEIN UA: NEGATIVE
Urobilinogen, UA: 0.2 E.U./dL

## 2017-06-22 NOTE — Progress Notes (Signed)
GYNECOLOGY  VISIT   HPI: 66 y.o.   Widowed  Caucasian  female   G2P1002 with Patient's last menstrual period was 08/07/2001 (approximate).   here for vaginal discharge with odor.    Slight burning with urination.   Finished Metrogel and then developed clumpy discharge. (Had nausea and headache with Tindamax.)  No vaginal bleeding.   Back is feeling much better.   Prolapse surgery 2 months ago.    Sees Dr. Kevan Ny in December for her check up.   Urine Dip: 1+RBCs  GYNECOLOGIC HISTORY: Patient's last menstrual period was 08/07/2001 (approximate). Contraception: Postmenopausal/Hysterectomy Menopausal hormone therapy:  none Last mammogram: 07/26/16 Density B/BIRADS1:neg: TBC Last pap smear:   01/17/17 negative - done with Dr. Kevan Ny.07/17/16 CIN1/LSIL. HR HPV:Neg        OB History    Gravida Para Term Preterm AB Living   2 2 1     2    SAB TAB Ectopic Multiple Live Births           2         Patient Active Problem List   Diagnosis Date Noted  . Status post laparoscopy-assisted vaginal hysterectomy 04/24/2017  . Dry eye syndrome 03/17/2016  . Headache 03/17/2016  . Hyperlipidemia 03/17/2016  . Lack of bladder control 03/17/2016  . Migraines 03/17/2016  . 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 Abnormal Pap smear of cervix 07/2016   CIN1  . Constipation   . Depression   . Diverticulosis   . H. pylori infection   . History of blood transfusion 1984  . Hx of colonic polyps   . Hypercholesteremia   . Hyponatremia   . Migraines   . Vitamin D deficiency     Past Surgical History:  Procedure Laterality Date  . ANTERIOR (CYSTOCELE) AND POSTERIOR (RECTOCELE)  REPAIR N/A 04/24/2017   Performed by Patton Salles, MD at [facility] ORS  . CESAREAN SECTION  1984  . COLONOSCOPY  2012  . COSMETIC SURGERY     Neck lift  . CYSTOSCOPY N/A 04/24/2017   Performed by Patton Salles, MD at [facility] ORS  . EXTENSIVE LAPAROSCOPIC LYSIS OF ADHESIONS N/A 04/24/2017   Performed by Patton Salles, MD at [facility] ORS  . HEMORRHOID SURGERY  1984  . LAPAROSCOPIC ASSISTED VAGINAL HYSTERECTOMY WITH SALPINGO OOPHORECTOMY and pelvic washings Bilateral 04/24/2017   Performed by Patton Salles, MD at [facility] ORS  . RECTAL PROLAPSE REPAIR     Dr. Kendrick Ranch  . UMBILICAL HERNIA REPAIR     had surgery as a child.    Current Outpatient Medications  Medication Sig Dispense Refill  . acetaminophen (TYLENOL) 325 MG tablet Take 325 mg by mouth every 6 (six) hours as needed.     . chlordiazePOXIDE (LIBRIUM) 10 MG capsule Take 20 mg by mouth at bedtime.     . cholecalciferol (VITAMIN D) 1000 units tablet Take 1,000 Units by mouth at bedtime.    . divalproex (DEPAKOTE ER) 250 MG 24 hr tablet TAKE 2 TABLETS AT BEDTIME. 60 tablet 0  . docusate sodium (COLACE) 100 MG capsule Take 100 mg by mouth at bedtime.     . lansoprazole (PREVACID) 30 MG capsule Take 30 mg by mouth 2 (two) times daily before a meal.     . Omega-3 Fatty Acids (FISH OIL PO) Take 1 capsule by  mouth daily.    [name]. oxyCODONE-acetaminophen (PERCOCET/ROXICET) 5-325 MG tablet Take 1-2 tablets by mouth every 4 (four) hours as needed for severe pain (moderate to severe pain (when tolerating fluids)). 30 tablet 0  . polyethylene glycol (MIRALAX / GLYCOLAX) packet Take 17 g by mouth at bedtime.     . Simethicone (PHAZYME MAXIMUM STRENGTH) 250 MG CAPS Take 250 mg by mouth daily as needed (gas).    . SUMAtriptan (IMITREX) 100 MG tablet Take 1 tablet.  May repeat once in 2 hours if headache persists or recurs. (Patient taking differently: Take 100 mg by mouth every 2 (two) hours as needed. Take 1 tablet.  May repeat once in 2 hours if headache persists or recurs.) 10 tablet 2  . traMADol (ULTRAM) 50 MG tablet Take 50 mg by mouth daily.    [name]. XIIDRA 5 % SOLN Place 1 drop into both eyes 2 (two) times daily.      No current  facility-administered medications for this visit.      ALLERGIES: Aspirin; Crestor [rosuvastatin calcium]; Inderal [propranolol]; Other; Relpax [eletriptan hydrobromide]; Salicylates; and Topamax [topiramate]  Family History  Problem Relation Age of Onset  . Migraines Mother   . Migraines Sister   . Cancer Father        Prostate    Social History   Socioeconomic History  . Marital status: Widowed    Spouse name: Not on file  . Number of children: Not on file  . Years of education: Not on file  . Highest education level: Not on file  Social Needs  . Financial resource strain: Not on file  . Food insecurity - worry: Not on file  . Food insecurity - inability: Not on file  . Transportation needs - medical: Not on file  . Transportation needs - non-medical: Not on file  Occupational History  . Occupation: LPN  Tobacco Use  . Smoking status: Never Smoker  . Smokeless tobacco: Never Used  Substance and Sexual Activity  . Alcohol use: No  . Drug use: No  . Sexual activity: Yes    Birth control/protection: Post-menopausal, Surgical    Comment: Hyst  Other Topics Concern  . Not on file  Social History Narrative   Pt has L.P.N through [facility]    ROS:  Pertinent items are noted in HPI.  PHYSICAL EXAMINATION:    BP 116/80 (BP Location: Right Arm, Patient Position: Sitting, Cuff Size: Normal)   Pulse 74   Resp 12   Wt 134 lb (60.8 kg)   LMP 08/07/2001 (Approximate)   BMI 27.06 kg/m     General appearance: alert, cooperative and appears stated age  Pelvic: External genitalia:  no lesions              Urethra:  normal appearing urethra with no masses, tenderness or lesions              Bartholins and Skenes: normal                 Vagina: normal appearing vagina with normal color and clumpy white discharge.              Cervix:  Absent.                 Bimanual Exam:  Uterus:  Absent.               Adnexa: no mass, fullness, tenderness         Chaperone was present for  exam.  ASSESSMENT  Recurrent vaginitis.   Microscopic hematuria.  PLAN  Affirm taken.  Urine micro and culture.  May consider Flagyl and boric acid 600 mg pv at hs x 2 weeks, test of cure, and then chronic tx with Metrogel twice weekly for 4 months.  Follow up in 4 weeks for final post op check.    An After Visit Summary was printed and given to the patient.  __15____ minutes face to face time of which over 50% was spent in counseling.

## 2017-06-22 NOTE — Patient Instructions (Signed)

## 2017-06-23 LAB — URINALYSIS, MICROSCOPIC ONLY
BACTERIA UA: NONE SEEN
Casts: NONE SEEN /lpf
EPITHELIAL CELLS (NON RENAL): NONE SEEN /HPF (ref 0–10)

## 2017-06-23 LAB — VAGINITIS/VAGINOSIS, DNA PROBE
CANDIDA SPECIES: NEGATIVE
Gardnerella vaginalis: NEGATIVE
TRICHOMONAS VAG: NEGATIVE

## 2017-06-24 LAB — URINE CULTURE

## 2017-06-25 ENCOUNTER — Telehealth: Payer: Self-pay | Admitting: Obstetrics and Gynecology

## 2017-06-25 ENCOUNTER — Encounter: Payer: Self-pay | Admitting: Obstetrics and Gynecology

## 2017-06-25 MED ORDER — SULFAMETHOXAZOLE-TRIMETHOPRIM 800-160 MG PO TABS: 1.0000 | ORAL_TABLET | Freq: Two times a day (BID) | ORAL | 0 refills | Status: DC

## 2017-06-25 NOTE — Telephone Encounter (Signed)
Urine Culture from 11/16 returned positive for E Coli.  Reviewed with Dr.Miller. Rx for Bactrim DS po bid x 3 days #6 0RF sent to pharmacy on file. Advised patient of results and recommended treatment. Patient verbalizes understanding.   Cc: Dr.Silva  Routing to covering provider for final review. Patient agreeable to disposition. Will close encounter.

## 2017-06-25 NOTE — Telephone Encounter (Signed)
See telephone encounter dated 06/25/17.  

## 2017-06-25 NOTE — Telephone Encounter (Signed)
Patient called and states that she believes she has a UTI because she is having severe pain and when she voids it is just blood.  Patient states she want to see only Dr Edward JollySilva today.  I informed patient doctor is in surgery but we could schedule her with another provider, patient declined. Patient was informed that if symptoms worsen she should go to urgent care.  States Dr Edward JollySilva did a culture on Friday afternoon and wants to know why she has not been contacted by Dr Edward JollySilva today.  Reminded patient Dr Edward JollySilva is in surgery today but would see if I can have a nurse review those with her.  Patient declined wants to go over results with Dr Edward JollySilva and wants her to call in something for the yeast infection.  Patient states she will consider going to urgent care for the UTI symptoms.

## 2017-07-04 ENCOUNTER — Other Ambulatory Visit: Payer: Self-pay | Admitting: Neurology

## 2017-07-05 ENCOUNTER — Telehealth: Payer: Self-pay | Admitting: Obstetrics and Gynecology

## 2017-07-05 NOTE — Telephone Encounter (Signed)
Patient is asking for update on prescription request. I informed patient [name] is still waiting on a response from Dr.Miller and would call her as soon as she has information. Patient said thank you.

## 2017-07-05 NOTE — Telephone Encounter (Signed)
Patient called requesting an appointment for discharge vaginally. She normally sees Dr. Edward JollySilva and [facility].

## 2017-07-05 NOTE — Telephone Encounter (Signed)
Ok to send in rx for Diflucan 150mg  po x 1, repeat in 72 hours.

## 2017-07-05 NOTE — Telephone Encounter (Signed)
Spoke with patient. Patient states that she completed full course of Bactrim for UTI. States she is now having vaginal itching, white discharge, and irritation. Patient is requesting rx for symptom relief. Advised will review with covering MD as Dr.Silva is out of the office. Patient is agreeable.  Dr.Miller, okay to send in rx for Diflucan 150 mg po x1, repeat in 72 hours if symptoms persist #2 0RF.

## 2017-07-05 NOTE — Telephone Encounter (Signed)
Routing to Dr. Miller

## 2017-07-06 MED ORDER — FLUCONAZOLE 150 MG PO TABS: ORAL_TABLET | ORAL | 0 refills | Status: DC

## 2017-07-06 NOTE — Telephone Encounter (Signed)
Spoke with patient, advised as seen below per Dr. Hyacinth MeekerMiller. Rx for Diflucan to verified pharmacy. Patient verbalizes understanding and is agreeable. Will close encounter.

## 2017-07-18 ENCOUNTER — Ambulatory Visit: Payer: Medicare Other | Admitting: Obstetrics and Gynecology

## 2017-07-20 ENCOUNTER — Encounter: Payer: Self-pay | Admitting: Obstetrics and Gynecology

## 2017-07-20 ENCOUNTER — Ambulatory Visit (INDEPENDENT_AMBULATORY_CARE_PROVIDER_SITE_OTHER): Payer: Medicare Other | Admitting: Obstetrics and Gynecology

## 2017-07-20 ENCOUNTER — Other Ambulatory Visit: Payer: Self-pay

## 2017-07-20 VITALS — BP 148/82 | HR 64 | Resp 14 | Wt 137.0 lb

## 2017-07-20 DIAGNOSIS — Z862 Personal history of diseases of the blood and blood-forming organs and certain disorders involving the immune mechanism: Secondary | ICD-10-CM | POA: Diagnosis not present

## 2017-07-20 DIAGNOSIS — Z90710 Acquired absence of both cervix and uterus: Secondary | ICD-10-CM | POA: Diagnosis not present

## 2017-07-20 DIAGNOSIS — R5383 Other fatigue: Secondary | ICD-10-CM | POA: Diagnosis not present

## 2017-07-20 NOTE — Progress Notes (Signed)
GYNECOLOGY  VISIT   HPI: 66 y.o.   Widowed  Caucasian  female   G2P1002 with Patient's last menstrual period was 08/07/2001 (approximate).   here for follow up. patient is 3 month s/p LAPAROSCOPIC ASSISTED VAGINAL HYSTERECTOMY WITH SALPINGO OOPHORECTOMY and pelvic washings . Benign pathology. She reports several vaginal infections and a UTI since her surgery, currently feeling good all of her vaginal symptoms have resolved. No pain, just tired.  Bowel and bladder function are normal. Not sexually active, no bleeding.  She has been having worsening back pain since her surgery, primary feels it is spasms.   GYNECOLOGIC HISTORY: Patient's last menstrual period was 08/07/2001 (approximate). Contraception:postmenopause Menopausal hormone therapy: none         OB History    Gravida Para Term Preterm AB Living   2 2 1     2    SAB TAB Ectopic Multiple Live Births           2         Patient Active Problem List   Diagnosis Date Noted  . Status post laparoscopy-assisted vaginal hysterectomy 04/24/2017  . Dry eye syndrome 03/17/2016  . Headache 03/17/2016  . Hyperlipidemia 03/17/2016  . Lack of bladder control 03/17/2016  . Migraines 03/17/2016  . 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 Abnormal Pap smear of cervix 07/2016   CIN1  . Constipation   . Depression   . Diverticulosis   . H. pylori infection   . History of blood transfusion 1984  . Hx of colonic polyps   . Hypercholesteremia   . Hyponatremia   . Migraines   . Vitamin D deficiency     Past Surgical History:  Procedure Laterality Date  . ANTERIOR AND POSTERIOR REPAIR N/A 04/24/2017   Procedure: ANTERIOR (CYSTOCELE) AND POSTERIOR (RECTOCELE)  REPAIR;  Surgeon: Patton SallesAmundson C Silva, Brook E, MD;  Location: [facility] ORS;  Service: Gynecology;  Laterality: N/A;  . CESAREAN SECTION  1984  . COLONOSCOPY  2012  . COSMETIC SURGERY     Neck lift   . CYSTOSCOPY N/A 04/24/2017   Procedure: CYSTOSCOPY;  Surgeon: Patton SallesAmundson C Silva, Brook E, MD;  Location: [facility] ORS;  Service: Gynecology;  Laterality: N/A;  . HEMORRHOID SURGERY  1984  . LAPAROSCOPIC LYSIS OF ADHESIONS N/A 04/24/2017   Procedure: EXTENSIVE LAPAROSCOPIC LYSIS OF ADHESIONS;  Surgeon: Patton SallesAmundson C Silva, Brook E, MD;  Location: [facility] ORS;  Service: Gynecology;  Laterality: N/A;  . LAPAROSCOPIC VAGINAL HYSTERECTOMY WITH SALPINGO OOPHORECTOMY Bilateral 04/24/2017   Procedure: LAPAROSCOPIC ASSISTED VAGINAL HYSTERECTOMY WITH SALPINGO OOPHORECTOMY and pelvic washings;  Surgeon: Patton SallesAmundson C Silva, Brook E, MD;  Location: [facility] ORS;  Service: Gynecology;  Laterality: Bilateral;  3 hours  . RECTAL PROLAPSE REPAIR     Dr. Kendrick Ranchim Davis  . UMBILICAL HERNIA REPAIR     had surgery as a child.    Current Outpatient Medications  Medication Sig Dispense Refill  . acetaminophen (TYLENOL) 325 MG tablet Take 325 mg by mouth every 6 (six) hours as needed.     . chlordiazePOXIDE (LIBRIUM) 10 MG capsule Take 20 mg by mouth at bedtime.     . cholecalciferol (VITAMIN D) 1000 units tablet Take 1,000 Units by mouth at bedtime.    . divalproex (DEPAKOTE ER) 250 MG 24 hr tablet TAKE 2 TABLETS AT BEDTIME. 60 tablet 5  . docusate sodium (COLACE) 100 MG capsule Take 100 mg  by mouth at bedtime.     . lansoprazole (PREVACID) 30 MG capsule Take 30 mg by mouth 2 (two) times daily before a meal.     . Omega-3 Fatty Acids (FISH OIL PO) Take 1 capsule by mouth daily.    . polyethylene glycol (MIRALAX / GLYCOLAX) packet Take 17 g by mouth at bedtime.     . Simethicone (PHAZYME MAXIMUM STRENGTH) 250 MG CAPS Take 250 mg by mouth daily as needed (gas).    . SUMAtriptan (IMITREX) 100 MG tablet Take 1 tablet.  May repeat once in 2 hours if headache persists or recurs. (Patient taking differently: Take 100 mg by mouth every 2 (two) hours as needed. Take 1 tablet.  May repeat once in 2 hours if headache persists or recurs.) 10 tablet 2  .  traMADol (ULTRAM) 50 MG tablet Take 50 mg by mouth daily.    [name]. XIIDRA 5 % SOLN Place 1 drop into both eyes 2 (two) times daily.      No current facility-administered medications for this visit.      ALLERGIES: Aspirin; Crestor [rosuvastatin calcium]; Inderal [propranolol]; Other; Relpax [eletriptan hydrobromide]; Salicylates; and Topamax [topiramate]  Family History  Problem Relation Age of Onset  . Migraines Mother   . Migraines Sister   . Cancer Father        Prostate    Social History   Socioeconomic History  . Marital status: Widowed    Spouse name: Not on file  . Number of children: Not on file  . Years of education: Not on file  . Highest education level: Not on file  Social Needs  . Financial resource strain: Not on file  . Food insecurity - worry: Not on file  . Food insecurity - inability: Not on file  . Transportation needs - medical: Not on file  . Transportation needs - non-medical: Not on file  Occupational History  . Occupation: LPN  Tobacco Use  . Smoking status: Never Smoker  . Smokeless tobacco: Never Used  Substance and Sexual Activity  . Alcohol use: No  . Drug use: No  . Sexual activity: Yes    Birth control/protection: Post-menopausal, Surgical    Comment: Hyst  Other Topics Concern  . Not on file  Social History Narrative   Pt has L.P.N through [facility]    Review of Systems  Constitutional: Negative.   HENT: Negative.   Eyes: Negative.   Respiratory: Negative.   Cardiovascular: Negative.   Gastrointestinal: Negative.   Genitourinary: Negative.   Musculoskeletal: Negative.   Skin: Negative.   Neurological: Negative.   Endo/Heme/Allergies: Negative.   Psychiatric/Behavioral: Negative.     PHYSICAL EXAMINATION:    BP (!) 148/82 (BP Location: Right Arm, Patient Position: Sitting, Cuff Size: Normal)   Pulse 64   Resp 14   Wt 137 lb (62.1 kg)   LMP 08/07/2001 (Approximate)   BMI 27.67 kg/m     General appearance: alert, cooperative  and appears stated age Abdomen: soft, non-tender; non distended, no masses,  no organomegaly. Incisions well healed.    ASSESSMENT Follow up s/p hysterectomy, 12 weeks out. Overall doing very well. Still very tired.  H/O anemia She has been having some low back pain. Primary thinks it is just spasms and wants her to start PT    PLAN CBC, Ferritin Cleared to start PT Call with any concerns, otherwise f/u with Dr Edward JollySilva next September   An After Visit Summary was printed and given to the patient.  10 minutes face to face time of which over 50% was spent in counseling.

## 2017-07-21 LAB — CBC
HEMATOCRIT: 37.6 % (ref 34.0–46.6)
HEMOGLOBIN: 12.2 g/dL (ref 11.1–15.9)
MCH: 31.5 pg (ref 26.6–33.0)
MCHC: 32.4 g/dL (ref 31.5–35.7)
MCV: 97 fL (ref 79–97)
Platelets: 214 10*3/uL (ref 150–379)
RBC: 3.87 x10E6/uL (ref 3.77–5.28)
RDW: 13 % (ref 12.3–15.4)
WBC: 6.1 10*3/uL (ref 3.4–10.8)

## 2017-07-21 LAB — FERRITIN: Ferritin: 92 ng/mL (ref 15–150)

## 2017-07-27 ENCOUNTER — Ambulatory Visit
Admission: RE | Admit: 2017-07-27 | Discharge: 2017-07-27 | Disposition: A | Discharge status: Home / Self Care | Payer: Medicare Other | Source: Ambulatory Visit | Attending: Family Medicine | Admitting: Family Medicine

## 2017-07-27 DIAGNOSIS — Z1231 Encounter for screening mammogram for malignant neoplasm of breast: Secondary | ICD-10-CM

## 2017-08-04 ENCOUNTER — Other Ambulatory Visit: Payer: Self-pay | Admitting: Family Medicine

## 2017-08-04 DIAGNOSIS — M8580 Other specified disorders of bone density and structure, unspecified site: Secondary | ICD-10-CM

## 2017-08-06 ENCOUNTER — Other Ambulatory Visit: Payer: Self-pay | Admitting: Family Medicine

## 2017-08-06 DIAGNOSIS — Z1231 Encounter for screening mammogram for malignant neoplasm of breast: Secondary | ICD-10-CM

## 2017-08-09 ENCOUNTER — Ambulatory Visit: Payer: Medicare Other | Attending: Family Medicine

## 2017-08-09 DIAGNOSIS — M546 Pain in thoracic spine: Secondary | ICD-10-CM

## 2017-08-09 DIAGNOSIS — M6281 Muscle weakness (generalized): Secondary | ICD-10-CM | POA: Insufficient documentation

## 2017-08-09 DIAGNOSIS — M545 Low back pain, unspecified: Secondary | ICD-10-CM

## 2017-08-09 DIAGNOSIS — G8929 Other chronic pain: Secondary | ICD-10-CM | POA: Insufficient documentation

## 2017-08-09 NOTE — Therapy (Signed)
[facility] [address], [address] [place], [place], [address] Phone: [phone]   Fax:  [phone]  Physical Therapy Evaluation  Patient Details  Name: [name] MRN: [id number] Date of Birth: 01/14/1951 Referring Provider: Shaune PollackGates, Donna, MD   Encounter Date: 08/09/2017  PT End of Session - 08/09/17 1322    Visit Number  1    Date for PT Re-Evaluation  10/04/17    PT Start Time  1230    PT Stop Time  1320    PT Time Calculation (min)  50 min    Activity Tolerance  Patient tolerated treatment well    Behavior During Therapy  [facility] for [facility] assessed/performed       Past Medical History:  Diagnosis Date  . Abnormal Pap smear of cervix 07/2016   CIN1  . Constipation   . Depression   . Diverticulosis   . H. pylori infection   . History of blood transfusion 1984  . Hx of colonic polyps   . Hypercholesteremia   . Hyponatremia   . Migraines   . Vitamin D deficiency     Past Surgical History:  Procedure Laterality Date  . ANTERIOR AND POSTERIOR REPAIR N/A 04/24/2017   Procedure: ANTERIOR (CYSTOCELE) AND POSTERIOR (RECTOCELE)  REPAIR;  Surgeon: Patton SallesAmundson C Silva, Brook E, MD;  Location: [facility];  Service: Gynecology;  Laterality: N/A;  . CESAREAN SECTION  1984  . COLONOSCOPY  2012  . COSMETIC SURGERY     Neck lift  . CYSTOSCOPY N/A 04/24/2017   Procedure: CYSTOSCOPY;  Surgeon: Patton SallesAmundson C Silva, Brook E, MD;  Location: [facility];  Service: Gynecology;  Laterality: N/A;  . HEMORRHOID SURGERY  1984  . LAPAROSCOPIC LYSIS OF ADHESIONS N/A 04/24/2017   Procedure: EXTENSIVE LAPAROSCOPIC LYSIS OF ADHESIONS;  Surgeon: Patton SallesAmundson C Silva, Brook E, MD;  Location: [facility];  Service: Gynecology;  Laterality: N/A;  . LAPAROSCOPIC VAGINAL HYSTERECTOMY WITH SALPINGO OOPHORECTOMY Bilateral 04/24/2017   Procedure: LAPAROSCOPIC ASSISTED VAGINAL HYSTERECTOMY WITH SALPINGO OOPHORECTOMY and pelvic washings;  Surgeon: Patton SallesAmundson C Silva, Brook E,  MD;  Location: [facility];  Service: Gynecology;  Laterality: Bilateral;  3 hours  . RECTAL PROLAPSE REPAIR     Dr. Kendrick Ranchim Davis  . UMBILICAL HERNIA REPAIR     had surgery as a child.    There were no vitals filed for this visit.   Subjective Assessment - 08/09/17 1233    Subjective  PT presents with LBP that began after hysterectomy surgery.  Pt began to have low back and thoracic pain when she got home from surgery. Pt was a regular exerciser and would like to return to walking and initiate UE weights.  Pt has taken pain medication and muscle relaxers as she needed to be released by surgeon.      Pertinent History  hysterectomy 04/2017    Limitations  Standing;Walking;Lifting    How long can you walk comfortably?  exhaustion with walking     Patient Stated Goals  care for her twin grand children, improve endurance for walking, reduce LBP and thoracic pain, establish a regular weight training routine    Currently in Pain?  Yes    Pain Score  5  up to 7-8/10    Pain Location  Back    Pain Orientation  Right;Left;Lower;Mid    Pain Descriptors / Indicators  Aching;Sore    Pain Type  Chronic pain    Pain Onset  More than a month ago  Pain Frequency  Constant    Aggravating Factors   end of the day, at night, walking too long    Pain Relieving Factors  heat, rest, pain medication         [facility] PT Assessment - 08/09/17 0001      Assessment   Medical Diagnosis  thoracolumbar back pain x 2-3 months    Referring Provider  Shaune Pollack, MD    Onset Date/Surgical Date  04/24/17    Next MD Visit  11/2017      Precautions   Precautions  None      Restrictions   Weight Bearing Restrictions  No      Balance Screen   Has the patient fallen in the past 6 months  No    Has the patient had a decrease in activity level because of a fear of falling?   No    Is the patient reluctant to leave their home because of a fear of falling?   No      Home Environment   Living Environment  Private  residence    Living Arrangements  Alone    Home Access  Stairs to enter      Prior Function   Level of Independence  Independent    Vocation  Retired    Gaffer  will care for her baby grandchildren in the afternoons    Leisure  walking for exercise      Cognition   Overall Cognitive Status  Within Functional Limits for tasks assessed      Observation/Other Assessments   Focus on Therapeutic Outcomes (FOTO)   53% limitation      Posture/Postural Control   Posture/Postural Control  No significant limitations      ROM / Strength   AROM / PROM / Strength  AROM;PROM;Strength      AROM   Overall AROM   Deficits    Overall AROM Comments  flexion and extension limited by 25% with pain.  All other A/ROM is full.        PROM   Overall PROM   Within functional limits for tasks performed    Overall PROM Comments  stiffness at end range of bil hip P/ROM      Strength   Overall Strength  Within functional limits for tasks performed    Overall Strength Comments  4+/5 bil LE strength.  Fair abdominal contraction      Palpation   Spinal mobility  reduced PA mobility T6-L5 with pain L2-5    Palpation comment  palpable tenderness over Rt>Lt lumbar paraspinals and Rt gluteals.      Transfers   Transfers  Independent with all Transfers      Ambulation/Gait   Ambulation/Gait  Yes    Ambulation/Gait Assistance  7: Independent    Gait Pattern  Within Functional Limits             Objective measurements completed on examination: See above findings.              PT Education - 08/09/17 1318    Education provided  Yes    Education Details  lumbar flexibility    Person(s) Educated  Patient    Methods  Explanation;Demonstration;Handout    Comprehension  Verbalized understanding;Returned demonstration       PT Short Term Goals - 08/09/17 1330      PT SHORT TERM GOAL #1   Title  be independent in initial HEP    Time  4    Period  Weeks    Status  New     Target Date  09/06/17      PT SHORT TERM GOAL #2   Title  verbalize and demonstrate body mechanics modifications for lumbar protection with housework and care of grandchildren    Time  4    Period  Weeks    Status  New    Target Date  09/06/17      PT SHORT TERM GOAL #3   Title  report < or = to 4/10 LBP at the end of the day    Time  4    Period  Weeks    Target Date  09/06/17      PT SHORT TERM GOAL #4   Title  return to regular walking routine for exercise and verbalize understanding of safe progression    Time  4    Period  Weeks    Status  New    Target Date  09/06/17        PT Long Term Goals - 08/09/17 1331      PT LONG TERM GOAL #1   Title  be independent in advanced HEP    Time  8    Period  Weeks    Status  New    Target Date  10/04/17      PT LONG TERM GOAL #2   Title  reduce FOTO to < or = to 38% limitation    Time  8    Period  Weeks    Status  New    Target Date  10/04/17      PT LONG TERM GOAL #3   Title  improve strength and endurance to walk for exercise without significant fatigue    Time  8    Period  Weeks    Status  New    Target Date  10/04/17      PT LONG TERM GOAL #4   Title  report < or = to 3/10 LBP at the end of the day    Time  8    Period  Weeks    Status  New    Target Date  10/04/17      PT LONG TERM GOAL #5   Title  verbalize safe progression of core strength and weight training exercise program and perform without increased pain    Time  8    Period  Weeks    Status  New    Target Date  10/04/17             Plan - 08/09/17 1322    Clinical Impression Statement  Pt presents to PT with a 3 month history of LBP.  Pt had a vaginal hysterectomy 04/2017 and reports that her back pain began when she returned home.  Pt has not been able to start PT as she needed to be cleared by her surgeon.  Pt used to walk regularly and wants to return to this as soon as possible.  Pt reports limited endurance with physical activity.   Pt demonstrates painful A/ROM of the lumbar spine, reports up to 7-8/10 LBP at the end of the day and reduced and painful spinal mobility with PA mobs. Pt with tension and trigger points in the lumbar spine and Rt gluteals.  Pt will benefit from skilled PT for core strength, hip flexibility, dry needling, spinal mobs and manual therapy to reduce pain and build endurance for return  to regular activity and exercise.    History and Personal Factors relevant to plan of care:  recent hysterectomy, depression    Clinical Presentation  Evolving    Clinical Presentation due to:  increased pain over the past 3 months    Clinical Decision Making  Moderate    Rehab Potential  Good    PT Frequency  2x / week    PT Duration  8 weeks    PT Treatment/Interventions  ADLs/Self Care Home Management;Cryotherapy;Electrical Stimulation;Ultrasound;Therapeutic activities;Therapeutic exercise;Patient/family education;Neuromuscular re-education;Manual techniques;Passive range of motion;Taping;Dry needling    PT Next Visit Plan  dry needling to lumbar spine and gluteals, body mechanics education, core strength, endurance    Consulted and Agree with Plan of Care  Patient       Patient will benefit from skilled therapeutic intervention in order to improve the following deficits and impairments:  Decreased activity tolerance, Decreased endurance, Decreased strength, Increased muscle spasms, Pain, Improper body mechanics, Impaired flexibility  Visit Diagnosis: Chronic bilateral low back pain without sciatica  Pain in thoracic spine  Muscle weakness (generalized)     Problem List Patient Active Problem List   Diagnosis Date Noted  . Status post laparoscopy-assisted vaginal hysterectomy 04/24/2017  . Dry eye syndrome 03/17/2016  . Headache 03/17/2016  . Hyperlipidemia 03/17/2016  . Lack of bladder control 03/17/2016  . Migraines 03/17/2016  . 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 MGD (meibomian gland disease)  02/27/2012    Lorrene Reid, PT 08/09/17 1:34 PM  [facility] [address] [place], [place], [address] Phone: [phone]   Fax:  [phone]  Name: [name] MRN: [id number] Date of Birth: 01-Dec-1950

## 2017-08-09 NOTE — Patient Instructions (Signed)
Perform all exercises below:  Hold _20___ seconds. Repeat _3___ times.  Do __3__ sessions per day. CAUTION: Movement should be gentle, steady and slow.  Knee to Chest  Lying supine, bend involved knee to chest. Perform with each leg.    Lumbar Rotation: Caudal - Bilateral (Supine)  Feet and knees together, arms outstretched, rotate knees left, turning head in opposite direction, until stretch is felt.      HIP: Hamstrings - Short Sitting   Rest leg on raised surface. Keep knee straight. Lift chest.   Piriformis Stretch    Lying on back, pull right knee toward opposite shoulder. Hold _20___ seconds. Repeat __3__ times. Do __3__ sessions per day.  http://gt2.exer.us/258   Copyright  VHI. All rights reserved.    [facility] [address], [address] [place], [place] [address] Phone # [phone] Fax [phone]

## 2017-08-13 ENCOUNTER — Encounter: Payer: Self-pay | Admitting: Physical Therapy

## 2017-08-13 ENCOUNTER — Ambulatory Visit: Payer: Medicare Other | Admitting: Physical Therapy

## 2017-08-13 DIAGNOSIS — G8929 Other chronic pain: Secondary | ICD-10-CM

## 2017-08-13 DIAGNOSIS — M546 Pain in thoracic spine: Secondary | ICD-10-CM

## 2017-08-13 DIAGNOSIS — M6281 Muscle weakness (generalized): Secondary | ICD-10-CM

## 2017-08-13 DIAGNOSIS — M545 Low back pain, unspecified: Secondary | ICD-10-CM

## 2017-08-13 NOTE — Therapy (Signed)
[facility] [address] [place], [place], [address] Phone: [phone]   Fax:  [phone]  Physical Therapy Treatment  Patient Details  Name: [name] MRN: [id number] Date of Birth: [birth year] Referring Provider: Shaune Pollack, MD   Encounter Date: 08/13/2017  PT End of Session - 08/13/17 1559    Visit Number  2    Date for PT Re-Evaluation  10/04/17    PT Start Time  1145    PT Stop Time  1240    PT Time Calculation (min)  55 min    Activity Tolerance  Patient tolerated treatment well    Behavior During Therapy  [facility] for tasks assessed/performed       Past Medical History:  Diagnosis Date  . Abnormal Pap smear of cervix 07/2016   CIN1  . Constipation   . Depression   . Diverticulosis   . H. pylori infection   . History of blood transfusion 1984  . Hx of colonic polyps   . Hypercholesteremia   . Hyponatremia   . Migraines   . Vitamin D deficiency     Past Surgical History:  Procedure Laterality Date  . ANTERIOR AND POSTERIOR REPAIR N/A 04/24/2017   Procedure: ANTERIOR (CYSTOCELE) AND POSTERIOR (RECTOCELE)  REPAIR;  Surgeon: Patton Salles, MD;  Location: [facility];  Service: Gynecology;  Laterality: N/A;  . CESAREAN SECTION  1984  . COLONOSCOPY  2012  . COSMETIC SURGERY     Neck lift  . CYSTOSCOPY N/A 04/24/2017   Procedure: CYSTOSCOPY;  Surgeon: Patton Salles, MD;  Location: [facility];  Service: Gynecology;  Laterality: N/A;  . HEMORRHOID SURGERY  1984  . LAPAROSCOPIC LYSIS OF ADHESIONS N/A 04/24/2017   Procedure: EXTENSIVE LAPAROSCOPIC LYSIS OF ADHESIONS;  Surgeon: Patton Salles, MD;  Location: [facility];  Service: Gynecology;  Laterality: N/A;  . LAPAROSCOPIC VAGINAL HYSTERECTOMY WITH SALPINGO OOPHORECTOMY Bilateral 04/24/2017   Procedure: LAPAROSCOPIC ASSISTED VAGINAL HYSTERECTOMY WITH SALPINGO OOPHORECTOMY and pelvic washings;  Surgeon: Patton Salles,  MD;  Location: [facility];  Service: Gynecology;  Laterality: Bilateral;  3 hours  . RECTAL PROLAPSE REPAIR     Dr. Kendrick Ranch  . UMBILICAL HERNIA REPAIR     had surgery as a child.    There were no vitals filed for this visit.  Subjective Assessment - 08/13/17 1156    Subjective  Stiff in AM, I did my exercises that helped.     Currently in Pain?  Yes    Pain Score  5     Pain Location  Back    Pain Orientation  Lower;Right;Left    Aggravating Factors   in the AM and over doing    Pain Relieving Factors  Heat , rest     Multiple Pain Sites  No                      [facility] Adult PT Treatment/Exercise - 08/13/17 0001      Self-Care   Self-Care  ADL's;Lifting;Posture;Heat/Ice Application    ADL's  Body mechanics for lumbar protection including infant care, vacuuming, lifting, and posture.       Manual Therapy   Manual Therapy  Myofascial release;Soft tissue mobilization    Manual therapy comments  soft tissue elongation to bil gluteals and lumbar paraspinals       Trigger Point Dry Needling - 08/13/17 1251    Consent  Given?  Yes dry needling performed by [name], PT    Education Handout Provided  Yes    Muscles Treated Lower Body  Gluteus maximus;Gluteus minimus bil lumbar multifidi           PT Education - 08/13/17 1206    Education provided  Yes    Education Details  Lumbar protective body mechanics Level 1 core ex for HEP    Person(s) Educated  Patient    Methods  Explanation;Demonstration;Tactile cues;Verbal cues;Handout    Comprehension  Verbal cues required;Returned demonstration;Verbalized understanding       PT Short Term Goals - 08/09/17 1330      PT SHORT TERM GOAL #1   Title  be independent in initial HEP    Time  4    Period  Weeks    Status  New    Target Date  09/06/17      PT SHORT TERM GOAL #2   Title  verbalize and demonstrate body mechanics modifications for lumbar protection with housework and care of grandchildren    Time   4    Period  Weeks    Status  New    Target Date  09/06/17      PT SHORT TERM GOAL #3   Title  report < or = to 4/10 LBP at the end of the day    Time  4    Period  Weeks    Target Date  09/06/17      PT SHORT TERM GOAL #4   Title  return to regular walking routine for exercise and verbalize understanding of safe progression    Time  4    Period  Weeks    Status  New    Target Date  09/06/17        PT Long Term Goals - 08/09/17 1331      PT LONG TERM GOAL #1   Title  be independent in advanced HEP    Time  8    Period  Weeks    Status  New    Target Date  10/04/17      PT LONG TERM GOAL #2   Title  reduce FOTO to < or = to 38% limitation    Time  8    Period  Weeks    Status  New    Target Date  10/04/17      PT LONG TERM GOAL #3   Title  improve strength and endurance to walk for exercise without significant fatigue    Time  8    Period  Weeks    Status  New    Target Date  10/04/17      PT LONG TERM GOAL #4   Title  report < or = to 3/10 LBP at the end of the day    Time  8    Period  Weeks    Status  New    Target Date  10/04/17      PT LONG TERM GOAL #5   Title  verbalize safe progression of core strength and weight training exercise program and perform without increased pain    Time  8    Period  Weeks    Status  New    Target Date  10/04/17            Plan - 08/13/17 1219    Clinical Impression Statement  Pt presents today verbally expressing her frustration with the slowness  of her recovery. PTA encouraged her to be patient as she just began PT and it takes time to change habits ( regarding body mechanics)  and get stronger. She was educcated in lumbar protective body mechanics and level 1 core exercises. She is concerned she stoops a lot during the day. Since PT was available, pt was able to receive her first dry needling session. Pt is independent and compliant with her initial HEP, meeting that goal.     Rehab Potential  Good    PT  Frequency  2x / week    PT Duration  8 weeks    PT Treatment/Interventions  ADLs/Self Care Home Management;Cryotherapy;Electrical Stimulation;Ultrasound;Therapeutic activities;Therapeutic exercise;Patient/family education;Neuromuscular re-education;Manual techniques;Passive range of motion;Taping;Dry needling    PT Next Visit Plan  Review body mechanics, review level 1 core exercises and progress: might be ready for leg movements and bridging.     Consulted and Agree with Plan of Care  --       Patient will benefit from skilled therapeutic intervention in order to improve the following deficits and impairments:  Decreased activity tolerance, Decreased endurance, Decreased strength, Increased muscle spasms, Pain, Improper body mechanics, Impaired flexibility  Visit Diagnosis: Chronic bilateral low back pain without sciatica  Pain in thoracic spine  Muscle weakness (generalized)     Problem List Patient Active Problem List   Diagnosis Date Noted  . Status post laparoscopy-assisted vaginal hysterectomy 04/24/2017  . Dry eye syndrome 03/17/2016  . Headache 03/17/2016  . Hyperlipidemia 03/17/2016  . Lack of bladder control 03/17/2016  . Migraines 03/17/2016  . 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 MGD (meibomian gland disease) 02/27/2012    [name], PTA 08/13/2017, 4:09 PM  [facility] [address] [place], [place], [address] Phone: [phone]   Fax:  [phone]  Name: [name] MRN: [id number] Date of Birth: 1951/04/15

## 2017-08-13 NOTE — Patient Instructions (Addendum)
Lower abdominal/core stability exercises  1. Practice your breathing technique: Inhale through your nose expanding your belly and rib cage. Try not to breathe into your chest. Exhale slowly and gradually out your mouth feeling a sense of softness to your body. Practice multiple times. This can be performed unlimited.  2. Finding the lower abdominals. Laying on your back with the knees bent, place your fingers just below your belly button. Using your breathing technique from above, on your exhale gently pull the belly button away from your fingertips without tensing any other muscles. Practice this 5x. Next, as you exhale, draw belly button inwards and hold onto it...then feel as if you are pulling that muscle across your pelvis like you are tightening a belt. This can be hard to do at first so be patient and practice. Do 5-10 reps 1-3 x day. Always recognize quality over quantity; if your abdominal muscles become tired you will notice you may tighten/contract other muscles. This is the time to take a break.   Practice this first laying on your back, then in sitting, progressing to standing and finally adding it to all your daily movements.   3. Finding your pelvic floor. Using the breathing technique above, when your exhale, this time draw your pelvic floor muscles up as if you were attempting to stop the flow of urination. Be careful NOT to tense any other muscles. This can be hard, BE PATIENT. Try to hold up to 10 seconds repeating 10x. Try 2x a day. Once you feel you are doing this well, add this contraction to exercise #2. First contracting your pelvic floor followed by lower abdominals.  4. Adding leg movements. Add the following leg movements to challenge your ability to keep your core stable:  1. Single leg drop outs: Laying on your back with knees bent feet flat. Inhale,  dropping one knee outward KEEPING YOUR PELVIS STILL. Exhale as you bring the leg back, simultaneously performing your lower  abdominal contraction. Do 5-10 on each leg.  2. Marching: While keeping your pelvis still, lift the right foot a few inches, put it down then lift left foot. This will mimic a march. Start slow to establish control. Once you have control you may speed it up. Do 10-20x. You MUST keep your lower abdominlas contracted while you march. Breathe naturally   3. Single leg slides: Inhale while you slowly slide one leg out keeping your pelvis still. Only slide your leg as far as you can keep your pelvis still. Exhale as you bring the leg back to the start, contracting the lower abdominals as you do that. Keep your upper body relaxed. Do 5-10 on each side.     Lifting Principles  .Maintain proper posture and head alignment. .Slide object as close as possible before lifting. .Move obstacles out of the way. .Test before lifting; ask for help if too heavy. .Tighten stomach muscles without holding breath. .Use smooth movements; do not jerk. .Use legs to do the work, and pivot with feet. .Distribute the work load symmetrically and close to the center of trunk. .Push instead of pull whenever possible.   Squat down and hold basket close to stand. Use leg muscles to do the work.    Avoid twisting or bending back. Pivot around using foot movements, and bend at knees if needed when reaching for articles.        Getting Into / Out of Bed   Lower self to lie down on one side by raising legs  and lowering head at the same time. Use arms to assist moving without twisting. Bend both knees to roll onto back if desired. To sit up, start from lying on side, and use same move-ments in reverse. Keep trunk aligned with legs.    Shift weight from front foot to back foot as item is lifted off shelf.    When leaning forward to pick object up from floor, extend one leg out behind. Keep back straight. Hold onto a sturdy support with other hand.      Sit upright, head facing forward. Try using a roll to  support lower back. Keep shoulders relaxed, and avoid rounded back. Keep hips level with knees. Avoid crossing legs for long periods.   Trigger Point Dry Needling  . What is Trigger Point Dry Needling (DN)? o DN is a physical therapy technique used to treat muscle pain and dysfunction. Specifically, DN helps deactivate muscle trigger points (muscle knots).  o A thin filiform needle is used to penetrate the skin and stimulate the underlying trigger point. The goal is for a local twitch response (LTR) to occur and for the trigger point to relax. No medication of any kind is injected during the procedure.   . What Does Trigger Point Dry Needling Feel Like?  o The procedure feels different for each individual patient. Some patients report that they do not actually feel the needle enter the skin and overall the process is not painful. Very mild bleeding may occur. However, many patients feel a deep cramping in the muscle in which the needle was inserted. This is the local twitch response.   [name] How Will I feel after the treatment? o Soreness is normal, and the onset of soreness may not occur for a few hours. Typically this soreness does not last longer than two days.  o Bruising is uncommon, however; ice can be used to decrease any possible bruising.  o In rare cases feeling tired or nauseous after the treatment is normal. In addition, your symptoms may get worse before they get better, this period will typically not last longer than 24 hours.   . What Can I do After My Treatment? o Increase your hydration by drinking more water for the next 24 hours. o You may place ice or heat on the areas treated that have become sore, however, do not use heat on inflamed or bruised areas. Heat often brings more relief post needling. o You can continue your regular activities, but vigorous activity is not recommended initially after the treatment for 24 hours. o DN is best combined with other physical therapy such  as strengthening, stretching, and other therapies.    [facility] [address] [place], [place] [address] Phone # [phone] Fax [phone]

## 2017-08-15 ENCOUNTER — Ambulatory Visit: Payer: Medicare Other

## 2017-08-15 DIAGNOSIS — M545 Low back pain, unspecified: Secondary | ICD-10-CM

## 2017-08-15 DIAGNOSIS — M6281 Muscle weakness (generalized): Secondary | ICD-10-CM

## 2017-08-15 DIAGNOSIS — G8929 Other chronic pain: Secondary | ICD-10-CM

## 2017-08-15 DIAGNOSIS — M546 Pain in thoracic spine: Secondary | ICD-10-CM

## 2017-08-15 NOTE — Therapy (Signed)
[facility] [address], [address] [place], [place], [address] Phone: [phone]   Fax:  [phone]  Physical Therapy Treatment  Patient Details  Name: [name] MRN: [id number] Date of Birth: 06-30-1951 Referring Provider: Shaune Pollack, MD   Encounter Date: 08/15/2017  PT End of Session - 08/15/17 0928    Visit Number  3    Date for PT Re-Evaluation  10/04/17    PT Start Time  0848    PT Stop Time  0929    PT Time Calculation (min)  41 min    Activity Tolerance  Patient tolerated treatment well    Behavior During Therapy  [facility] for tasks assessed/performed       Past Medical History:  Diagnosis Date  . Abnormal Pap smear of cervix 07/2016   CIN1  . Constipation   . Depression   . Diverticulosis   . H. pylori infection   . History of blood transfusion 1984  . Hx of colonic polyps   . Hypercholesteremia   . Hyponatremia   . Migraines   . Vitamin D deficiency     Past Surgical History:  Procedure Laterality Date  . ANTERIOR AND POSTERIOR REPAIR N/A 04/24/2017   Procedure: ANTERIOR (CYSTOCELE) AND POSTERIOR (RECTOCELE)  REPAIR;  Surgeon: Patton Salles, MD;  Location: [facility];  Service: Gynecology;  Laterality: N/A;  . CESAREAN SECTION  1984  . COLONOSCOPY  2012  . COSMETIC SURGERY     Neck lift  . CYSTOSCOPY N/A 04/24/2017   Procedure: CYSTOSCOPY;  Surgeon: Patton Salles, MD;  Location: [facility];  Service: Gynecology;  Laterality: N/A;  . HEMORRHOID SURGERY  1984  . LAPAROSCOPIC LYSIS OF ADHESIONS N/A 04/24/2017   Procedure: EXTENSIVE LAPAROSCOPIC LYSIS OF ADHESIONS;  Surgeon: Patton Salles, MD;  Location: [facility];  Service: Gynecology;  Laterality: N/A;  . LAPAROSCOPIC VAGINAL HYSTERECTOMY WITH SALPINGO OOPHORECTOMY Bilateral 04/24/2017   Procedure: LAPAROSCOPIC ASSISTED VAGINAL HYSTERECTOMY WITH SALPINGO OOPHORECTOMY and pelvic washings;  Surgeon: Patton Salles,  MD;  Location: [facility];  Service: Gynecology;  Laterality: Bilateral;  3 hours  . RECTAL PROLAPSE REPAIR     Dr. Kendrick Ranch  . UMBILICAL HERNIA REPAIR     had surgery as a child.    There were no vitals filed for this visit.  Subjective Assessment - 08/15/17 0852    Subjective  I was a little sore after needling and I think that i feel better today.  I was able to walk a mile yesterday.      Pertinent History  hysterectomy 04/2017    Currently in Pain?  Yes    Pain Score  3     Pain Location  Back    Pain Orientation  Right;Left;Lower    Pain Descriptors / Indicators  Aching;Sore    Pain Type  Chronic pain    Pain Onset  More than a month ago    Pain Frequency  Constant    Aggravating Factors   in the AM morning hours, when doing too much    Pain Relieving Factors  rest, stretching, rest                      [facility] Adult PT Treatment/Exercise - 08/15/17 0001      Exercises   Exercises  Lumbar;Knee/Hip      Lumbar Exercises: Stretches   Active Hamstring Stretch  3  reps;20 seconds    Piriformis Stretch  3 reps;20 seconds seated      Lumbar Exercises: Supine   Ab Set  20 reps breathing technique with TA and pelvic floor contraction    Bridge  20 reps;5 seconds    Straight Leg Raise  10 reps abdominal bracing      Lumbar Exercises: Sidelying   Clam  20 reps      Knee/Hip Exercises: Aerobic   Nustep  Level 2 x 6 minutes PT present to discuss progress             PT Education - 08/15/17 0923    Education provided  Yes    Education Details  sidelying clam, bridge    Person(s) Educated  Patient    Methods  Explanation;Demonstration    Comprehension  Verbalized understanding;Returned demonstration       PT Short Term Goals - 08/15/17 0856      PT SHORT TERM GOAL #1   Title  be independent in initial HEP    Time  4    Period  Weeks    Status  On-going      PT SHORT TERM GOAL #2   Title  verbalize and demonstrate body mechanics modifications for  lumbar protection with housework and care of grandchildren    Status  Achieved      PT SHORT TERM GOAL #3   Status  New        PT Long Term Goals - 08/09/17 1331      PT LONG TERM GOAL #1   Title  be independent in advanced HEP    Time  8    Period  Weeks    Status  New    Target Date  10/04/17      PT LONG TERM GOAL #2   Title  reduce FOTO to < or = to 38% limitation    Time  8    Period  Weeks    Status  New    Target Date  10/04/17      PT LONG TERM GOAL #3   Title  improve strength and endurance to walk for exercise without significant fatigue    Time  8    Period  Weeks    Status  New    Target Date  10/04/17      PT LONG TERM GOAL #4   Title  report < or = to 3/10 LBP at the end of the day    Time  8    Period  Weeks    Status  New    Target Date  10/04/17      PT LONG TERM GOAL #5   Title  verbalize safe progression of core strength and weight training exercise program and perform without increased pain    Time  8    Period  Weeks    Status  New    Target Date  10/04/17            Plan - 08/15/17 0858    Clinical Impression Statement  Pt with soreness after dry needling last session.  Pt was able to walk 1 mile yesterday and PT encouraged her to increase her time and distance gradually.  Pt is making body mechanics modifications at home.  PT reveiwed core strength exercises and pt required verbal and tactile cues for correct technique.  Pt will continue to benefit from skilled PT for core strength, flexibility, manual and modalities as  needed to reduce pain and improve endurance.    Rehab Potential  Good    PT Frequency  2x / week    PT Duration  8 weeks    PT Treatment/Interventions  ADLs/Self Care Home Management;Cryotherapy;Electrical Stimulation;Ultrasound;Therapeutic activities;Therapeutic exercise;Patient/family education;Neuromuscular re-education;Manual techniques;Passive range of motion;Taping;Dry needling    PT Next Visit Plan  Dry needling  to gluteals and lumbar multifidi, continue core/postural strength, hip flexibility    Recommended Other Services  initial certification is signed    Consulted and Agree with Plan of Care  Patient       Patient will benefit from skilled therapeutic intervention in order to improve the following deficits and impairments:  Decreased activity tolerance, Decreased endurance, Decreased strength, Increased muscle spasms, Pain, Improper body mechanics, Impaired flexibility  Visit Diagnosis: Chronic bilateral low back pain without sciatica  Pain in thoracic spine  Muscle weakness (generalized)     Problem List Patient Active Problem List   Diagnosis Date Noted  . Status post laparoscopy-assisted vaginal hysterectomy 04/24/2017  . Dry eye syndrome 03/17/2016  . Headache 03/17/2016  . Hyperlipidemia 03/17/2016  . Lack of bladder control 03/17/2016  . Migraines 03/17/2016  . 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 MGD (meibomian gland disease) 02/27/2012    Lorrene ReidKelly Nadya Hopwood, PT 08/15/17 9:31 AM  [facility] [address], [address] [place], [place], [address] Phone: [phone]   Fax:  [phone]  Name: [name] MRN: [id number] Date of Birth: 10/04/1950

## 2017-08-15 NOTE — Patient Instructions (Signed)
Bridge    Lie back, legs bent. Inhale, pressing hips up. Keeping ribs in, lengthen lower back. Exhale, rolling down along spine from top.  Hold 5 seconds Repeat __2x10__ times. Do __1-2__ sessions per day.  http://pm.exer.us/55   Copyright  VHI. All rights reserved.  Abduction: Clam (Eccentric) - Side-Lying    Lie on side with knees bent. Lift top knee, keeping feet together. Keep trunk steady. Do 2 sets of 10.  Keep abdominals contracted.   Add ___ lbs when you achieve ___ repetitions.  http://ecce.exer.us/65   Copyright  VHI. All rights reserved.  [facility] [address], [address] [place], [place] [address] Phone # [phone] Fax [phone]

## 2017-08-20 ENCOUNTER — Encounter: Payer: Medicare Other | Admitting: Physical Therapy

## 2017-08-22 ENCOUNTER — Ambulatory Visit: Payer: Medicare Other

## 2017-08-22 DIAGNOSIS — G8929 Other chronic pain: Secondary | ICD-10-CM

## 2017-08-22 DIAGNOSIS — M546 Pain in thoracic spine: Secondary | ICD-10-CM

## 2017-08-22 DIAGNOSIS — M545 Low back pain, unspecified: Secondary | ICD-10-CM

## 2017-08-22 DIAGNOSIS — M6281 Muscle weakness (generalized): Secondary | ICD-10-CM

## 2017-08-22 NOTE — Therapy (Signed)
[facility] [address] [place], [place], [address] Phone: [phone]   Fax:  [phone]  Physical Therapy Treatment  Patient Details  Name: [name] MRN: [id number] Date of Birth: 08/01/1951 Referring Provider: Shaune Pollack, MD   Encounter Date: 08/22/2017  PT End of Session - 08/22/17 1316    Visit Number  4    Date for PT Re-Evaluation  10/04/17    PT Start Time  1230    PT Stop Time  1316    PT Time Calculation (min)  46 min    Activity Tolerance  Patient tolerated treatment well    Behavior During Therapy  [facility] for tasks assessed/performed       Past Medical History:  Diagnosis Date  . Abnormal Pap smear of cervix 07/2016   CIN1  . Constipation   . Depression   . Diverticulosis   . H. pylori infection   . History of blood transfusion 1984  . Hx of colonic polyps   . Hypercholesteremia   . Hyponatremia   . Migraines   . Vitamin D deficiency     Past Surgical History:  Procedure Laterality Date  . ANTERIOR AND POSTERIOR REPAIR N/A 04/24/2017   Procedure: ANTERIOR (CYSTOCELE) AND POSTERIOR (RECTOCELE)  REPAIR;  Surgeon: Patton Salles, MD;  Location: [facility];  Service: Gynecology;  Laterality: N/A;  . CESAREAN SECTION  1984  . COLONOSCOPY  2012  . COSMETIC SURGERY     Neck lift  . CYSTOSCOPY N/A 04/24/2017   Procedure: CYSTOSCOPY;  Surgeon: Patton Salles, MD;  Location: [facility];  Service: Gynecology;  Laterality: N/A;  . HEMORRHOID SURGERY  1984  . LAPAROSCOPIC LYSIS OF ADHESIONS N/A 04/24/2017   Procedure: EXTENSIVE LAPAROSCOPIC LYSIS OF ADHESIONS;  Surgeon: Patton Salles, MD;  Location: [facility];  Service: Gynecology;  Laterality: N/A;  . LAPAROSCOPIC VAGINAL HYSTERECTOMY WITH SALPINGO OOPHORECTOMY Bilateral 04/24/2017   Procedure: LAPAROSCOPIC ASSISTED VAGINAL HYSTERECTOMY WITH SALPINGO OOPHORECTOMY and pelvic washings;  Surgeon: Patton Salles,  MD;  Location: [facility];  Service: Gynecology;  Laterality: Bilateral;  3 hours  . RECTAL PROLAPSE REPAIR     Dr. Kendrick Ranch  . UMBILICAL HERNIA REPAIR     had surgery as a child.    There were no vitals filed for this visit.  Subjective Assessment - 08/22/17 1233    Subjective  I am not sure if I want to do dry needling.  It helped me last time but I am afraid.  I feel 50% better.     Pertinent History  hysterectomy 04/2017    Patient Stated Goals  care for her twin grand children, improve endurance for walking, reduce LBP and thoracic pain, establish a regular weight training routine    Currently in Pain?  Yes    Pain Score  0-No pain    Pain Location  Back    Pain Orientation  Right;Left;Lower    Pain Descriptors / Indicators  Sore                      [facility] Adult PT Treatment/Exercise - 08/22/17 0001      Lumbar Exercises: Stretches   Active Hamstring Stretch  3 reps;20 seconds    Piriformis Stretch  3 reps;20 seconds supine       Lumbar Exercises: Supine   Bridge  20 reps;5 seconds    Straight Leg Raise  10 reps abdominal bracing      Lumbar Exercises: Sidelying   Clam  20 reps      Knee/Hip Exercises: Aerobic   Nustep  Level 2 x 8 minutes PT present to discuss progress      Manual Therapy   Manual Therapy  Myofascial release;Soft tissue mobilization    Manual therapy comments  soft tissue elongation to bil gluteals and lumbar paraspinals, PA mobs L1-5       Trigger Point Dry Needling - 08/22/17 1302    Consent Given?  Yes    Muscles Treated Upper Body  -- lumbar multifidi             PT Short Term Goals - 08/22/17 1233      PT SHORT TERM GOAL #1   Title  be independent in initial HEP    Status  Achieved      PT SHORT TERM GOAL #2   Title  verbalize and demonstrate body mechanics modifications for lumbar protection with housework and care of grandchildren    Status  Achieved        PT Long Term Goals - 08/09/17 1331      PT LONG  TERM GOAL #1   Title  be independent in advanced HEP    Time  8    Period  Weeks    Status  New    Target Date  10/04/17      PT LONG TERM GOAL #2   Title  reduce FOTO to < or = to 38% limitation    Time  8    Period  Weeks    Status  New    Target Date  10/04/17      PT LONG TERM GOAL #3   Title  improve strength and endurance to walk for exercise without significant fatigue    Time  8    Period  Weeks    Status  New    Target Date  10/04/17      PT LONG TERM GOAL #4   Title  report < or = to 3/10 LBP at the end of the day    Time  8    Period  Weeks    Status  New    Target Date  10/04/17      PT LONG TERM GOAL #5   Title  verbalize safe progression of core strength and weight training exercise program and perform without increased pain    Time  8    Period  Weeks    Status  New    Target Date  10/04/17            Plan - 08/22/17 1235    Clinical Impression Statement  Pt reports 50% overall improvement in symptoms since the start of care.  Pt has been able to walk for 1 mile and tried 2 miles and it was too much.  PT encouraged her to gradually increase her distance. Pt is making body mechanics modifications at home and with care of her grandchildren.  Pt with tension and trigger points in her lumbar spine and glureals and demonstated improved tissue mobility and reduced pain after dry needling today. Pt will continue to benefit from skilled PT for flexibility, core strength, hip strength, manual therapy and modalities as needed.      Rehab Potential  Good    PT Frequency  2x / week    PT Duration  8 weeks    PT  Treatment/Interventions  ADLs/Self Care Home Management;Cryotherapy;Electrical Stimulation;Ultrasound;Therapeutic activities;Therapeutic exercise;Patient/family education;Neuromuscular re-education;Manual techniques;Passive range of motion;Taping;Dry needling    PT Next Visit Plan  assess response to dry needling to gluteals and lumbar multifidi, continue  core/postural strength, hip flexibility    Consulted and Agree with Plan of Care  Patient       Patient will benefit from skilled therapeutic intervention in order to improve the following deficits and impairments:  Decreased activity tolerance, Decreased endurance, Decreased strength, Increased muscle spasms, Pain, Improper body mechanics, Impaired flexibility  Visit Diagnosis: Chronic bilateral low back pain without sciatica  Pain in thoracic spine  Muscle weakness (generalized)     Problem List Patient Active Problem List   Diagnosis Date Noted  . Status post laparoscopy-assisted vaginal hysterectomy 04/24/2017  . Dry eye syndrome 03/17/2016  . Headache 03/17/2016  . Hyperlipidemia 03/17/2016  . Lack of bladder control 03/17/2016  . Migraines 03/17/2016  . 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 MGD (meibomian gland disease) 02/27/2012     Lorrene ReidKelly [place], PT 08/22/17 1:18 PM  [facility] [address], [address] [place], [place], [address] Phone: [phone]   Fax:  [phone]  Name: [name] MRN: [id number] Date of Birth: 07/04/1951

## 2017-08-24 ENCOUNTER — Ambulatory Visit: Payer: Medicare Other | Admitting: Physical Therapy

## 2017-08-24 ENCOUNTER — Telehealth: Payer: Self-pay | Admitting: Physical Therapy

## 2017-08-27 ENCOUNTER — Ambulatory Visit: Payer: Medicare Other

## 2017-08-27 DIAGNOSIS — G8929 Other chronic pain: Secondary | ICD-10-CM

## 2017-08-27 DIAGNOSIS — M6281 Muscle weakness (generalized): Secondary | ICD-10-CM

## 2017-08-27 DIAGNOSIS — M545 Low back pain, unspecified: Secondary | ICD-10-CM

## 2017-08-27 DIAGNOSIS — M546 Pain in thoracic spine: Secondary | ICD-10-CM

## 2017-08-27 NOTE — Therapy (Signed)
[facility] [address] [place], [place], [address] Phone: [phone]   Fax:  [phone]  Physical Therapy Treatment  Patient Details  Name: [name] MRN: [id number] Date of Birth: August 26, 1950 Referring Provider: Shaune Pollack, MD   Encounter Date: 08/27/2017  PT End of Session - 08/27/17 1018    Visit Number  5    Date for PT Re-Evaluation  10/04/17    PT Start Time  0930    PT Stop Time  1029    PT Time Calculation (min)  59 min    Activity Tolerance  Patient tolerated treatment well    Behavior During Therapy  [facility] for tasks assessed/performed       Past Medical History:  Diagnosis Date  . Abnormal Pap smear of cervix 07/2016   CIN1  . Constipation   . Depression   . Diverticulosis   . H. pylori infection   . History of blood transfusion 1984  . Hx of colonic polyps   . Hypercholesteremia   . Hyponatremia   . Migraines   . Vitamin D deficiency     Past Surgical History:  Procedure Laterality Date  . ANTERIOR AND POSTERIOR REPAIR N/A 04/24/2017   Procedure: ANTERIOR (CYSTOCELE) AND POSTERIOR (RECTOCELE)  REPAIR;  Surgeon: Patton Salles, MD;  Location: [facility];  Service: Gynecology;  Laterality: N/A;  . CESAREAN SECTION  1984  . COLONOSCOPY  2012  . COSMETIC SURGERY     Neck lift  . CYSTOSCOPY N/A 04/24/2017   Procedure: CYSTOSCOPY;  Surgeon: Patton Salles, MD;  Location: [facility];  Service: Gynecology;  Laterality: N/A;  . HEMORRHOID SURGERY  1984  . LAPAROSCOPIC LYSIS OF ADHESIONS N/A 04/24/2017   Procedure: EXTENSIVE LAPAROSCOPIC LYSIS OF ADHESIONS;  Surgeon: Patton Salles, MD;  Location: [facility];  Service: Gynecology;  Laterality: N/A;  . LAPAROSCOPIC VAGINAL HYSTERECTOMY WITH SALPINGO OOPHORECTOMY Bilateral 04/24/2017   Procedure: LAPAROSCOPIC ASSISTED VAGINAL HYSTERECTOMY WITH SALPINGO OOPHORECTOMY and pelvic washings;  Surgeon: Patton Salles,  MD;  Location: [facility];  Service: Gynecology;  Laterality: Bilateral;  3 hours  . RECTAL PROLAPSE REPAIR     Dr. Kendrick Ranch  . UMBILICAL HERNIA REPAIR     had surgery as a child.    There were no vitals filed for this visit.  Subjective Assessment - 08/27/17 0933    Subjective  I am really hurting today.  Dry needling helped me after last session.  I am having a difficult time lifting my grandbabies out of thier swings.     Patient Stated Goals  care for her twin grand children, improve endurance for walking, reduce LBP and thoracic pain, establish a regular weight training routine    Currently in Pain?  Yes    Pain Score  7     Pain Location  Back    Pain Orientation  Left;Right;Lower    Pain Descriptors / Indicators  Aching;Sore    Pain Onset  More than a month ago    Pain Frequency  Constant    Aggravating Factors   lifting babies, in the morning hours    Pain Relieving Factors  rest, stretching                      [facility] Adult PT Treatment/Exercise - 08/27/17 0001      Lumbar Exercises: Stretches   Active Hamstring Stretch  3 reps;20  seconds    Piriformis Stretch  3 reps;20 seconds supine       Lumbar Exercises: Supine   Bridge  20 reps;5 seconds    Straight Leg Raise  10 reps abdominal bracing      Lumbar Exercises: Sidelying   Clam  20 reps      Knee/Hip Exercises: Aerobic   Recumbent Bike  Level 1x 6 minutes  PT present to discuss progress      Modalities   Modalities  Moist Heat      Moist Heat Therapy   Number Minutes Moist Heat  12 Minutes    Moist Heat Location  Lumbar Spine      Manual Therapy   Manual Therapy  Myofascial release;Soft tissue mobilization    Manual therapy comments  soft tissue elongation to bil gluteals and lumbar paraspinals, PA mobs L1-5       Trigger Point Dry Needling - 08/27/17 0939    Consent Given?  Yes    Muscles Treated Upper Body  -- lumbar multifidi             PT Short Term Goals - 08/22/17 1233       PT SHORT TERM GOAL #1   Title  be independent in initial HEP    Status  Achieved      PT SHORT TERM GOAL #2   Title  verbalize and demonstrate body mechanics modifications for lumbar protection with housework and care of grandchildren    Status  Achieved        PT Long Term Goals - 08/09/17 1331      PT LONG TERM GOAL #1   Title  be independent in advanced HEP    Time  8    Period  Weeks    Status  New    Target Date  10/04/17      PT LONG TERM GOAL #2   Title  reduce FOTO to < or = to 38% limitation    Time  8    Period  Weeks    Status  New    Target Date  10/04/17      PT LONG TERM GOAL #3   Title  improve strength and endurance to walk for exercise without significant fatigue    Time  8    Period  Weeks    Status  New    Target Date  10/04/17      PT LONG TERM GOAL #4   Title  report < or = to 3/10 LBP at the end of the day    Time  8    Period  Weeks    Status  New    Target Date  10/04/17      PT LONG TERM GOAL #5   Title  verbalize safe progression of core strength and weight training exercise program and perform without increased pain    Time  8    Period  Weeks    Status  New    Target Date  10/04/17            Plan - 08/27/17 0940    Clinical Impression Statement  Pt reports 50% overall improvement in symptoms since the start of care.  Pt reports a flare-up in pain over the weekend due to lifting her grandchildren from a low swing.  Pt is making modificaitons to her body mechanics with lifting.  Pt was emotional today during session due to frustration.  Pt with tension  and triger points in the lumbar paraspinals and demonsrated improved mobility and reduced pain after session today.  Pt will benefit from continued PT for strength, flexibility and manual therapy, pain management.      PT Frequency  2x / week    PT Duration  8 weeks    PT Next Visit Plan  assess response to dry needling to gluteals and lumbar multifidi, continue core/postural  strength, hip flexibility    Consulted and Agree with Plan of Care  Patient       Patient will benefit from skilled therapeutic intervention in order to improve the following deficits and impairments:  Decreased activity tolerance, Decreased endurance, Decreased strength, Increased muscle spasms, Pain, Improper body mechanics, Impaired flexibility  Visit Diagnosis: Chronic bilateral low back pain without sciatica  Pain in thoracic spine  Muscle weakness (generalized)     Problem List Patient Active Problem List   Diagnosis Date Noted  . Status post laparoscopy-assisted vaginal hysterectomy 04/24/2017  . Dry eye syndrome 03/17/2016  . Headache 03/17/2016  . Hyperlipidemia 03/17/2016  . Lack of bladder control 03/17/2016  . Migraines 03/17/2016  . 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 MGD (meibomian gland disease) 02/27/2012    Lorrene Reid, PT 08/27/17 10:24 AM  [facility] [address] [place], [place], [address] Phone: [phone]   Fax:  [phone]  Name: [name] MRN: [id number] Date of Birth: [birth year]

## 2017-08-29 ENCOUNTER — Encounter: Payer: Self-pay | Admitting: Physical Therapy

## 2017-08-29 ENCOUNTER — Ambulatory Visit
Admission: RE | Admit: 2017-08-29 | Discharge: 2017-08-29 | Disposition: A | Discharge status: Home / Self Care | Payer: Medicare Other | Source: Ambulatory Visit | Attending: Family Medicine | Admitting: Family Medicine

## 2017-08-29 ENCOUNTER — Ambulatory Visit: Payer: Medicare Other | Admitting: Physical Therapy

## 2017-08-29 DIAGNOSIS — M6281 Muscle weakness (generalized): Secondary | ICD-10-CM

## 2017-08-29 DIAGNOSIS — M545 Low back pain, unspecified: Secondary | ICD-10-CM

## 2017-08-29 DIAGNOSIS — M8580 Other specified disorders of bone density and structure, unspecified site: Secondary | ICD-10-CM

## 2017-08-29 DIAGNOSIS — G8929 Other chronic pain: Secondary | ICD-10-CM

## 2017-08-29 DIAGNOSIS — M546 Pain in thoracic spine: Secondary | ICD-10-CM

## 2017-08-29 NOTE — Patient Instructions (Signed)
New HEP:   1. Leg/circles with band: Lay on your back in bed, RT knee bent. Using your resistance band around the LT foot, straighten the LT knee. Make circles maybe the size of a watermelon so the thigh bone is moving in the hip socket. 10x SLOWLY in each direction.   2. Lt leg lifts: Left leg straight on bed/mat the right knee is bent. Organize your body first: Lt leg LONG, toes pulling back to the nose, heel bone presses away. You literally slide your leg down the bed some to create space at your hip and back. THEN, pull your lower abs in BEFORE you lift the leg. Lift the leg keeping everything in place 6x. Rest, the repeat. Work to 8-10x but the need to be good ones.   3. Green band exercises:  Tie band around the thighs above the knee. Low back flat to your heating pad, lower abs pulling in. Keeping the pelvis still, pull thigh bones out and slowly return to starting position. Do 10x slowly. 2 sets is fine to do.  Bridge with band: Press out on the band 2-4 inches, feet flat and relaxed, lift the buttocks up into your bridge slowly with the lower abs drawing in. Slowly lower. Do 6x, 2 sets.

## 2017-08-29 NOTE — Therapy (Signed)
[facility] [address], [address] [place], [place], [address] Phone: [phone]   Fax:  [phone]  Physical Therapy Treatment  Patient Details  Name: [name] MRN: [id number] Date of Birth: Oct 22, 1950 Referring Provider: Shaune Pollack, MD   Encounter Date: 08/29/2017  PT End of Session - 08/29/17 0840    Visit Number  6    Date for PT Re-Evaluation  10/04/17    PT Start Time  0840    PT Stop Time  0945    PT Time Calculation (min)  65 min    Activity Tolerance  Patient tolerated treatment well    Behavior During Therapy  [facility] for tasks assessed/performed       Past Medical History:  Diagnosis Date  . Abnormal Pap smear of cervix 07/2016   CIN1  . Constipation   . Depression   . Diverticulosis   . H. pylori infection   . History of blood transfusion 1984  . Hx of colonic polyps   . Hypercholesteremia   . Hyponatremia   . Migraines   . Vitamin D deficiency     Past Surgical History:  Procedure Laterality Date  . ANTERIOR AND POSTERIOR REPAIR N/A 04/24/2017   Procedure: ANTERIOR (CYSTOCELE) AND POSTERIOR (RECTOCELE)  REPAIR;  Surgeon: Patton Salles, MD;  Location: [facility];  Service: Gynecology;  Laterality: N/A;  . CESAREAN SECTION  1984  . COLONOSCOPY  2012  . COSMETIC SURGERY     Neck lift  . CYSTOSCOPY N/A 04/24/2017   Procedure: CYSTOSCOPY;  Surgeon: Patton Salles, MD;  Location: [facility];  Service: Gynecology;  Laterality: N/A;  . HEMORRHOID SURGERY  1984  . LAPAROSCOPIC LYSIS OF ADHESIONS N/A 04/24/2017   Procedure: EXTENSIVE LAPAROSCOPIC LYSIS OF ADHESIONS;  Surgeon: Patton Salles, MD;  Location: [facility];  Service: Gynecology;  Laterality: N/A;  . LAPAROSCOPIC VAGINAL HYSTERECTOMY WITH SALPINGO OOPHORECTOMY Bilateral 04/24/2017   Procedure: LAPAROSCOPIC ASSISTED VAGINAL HYSTERECTOMY WITH SALPINGO OOPHORECTOMY and pelvic washings;  Surgeon: Patton Salles,  MD;  Location: [facility];  Service: Gynecology;  Laterality: Bilateral;  3 hours  . RECTAL PROLAPSE REPAIR     Dr. Kendrick Ranch  . UMBILICAL HERNIA REPAIR     had surgery as a child.    There were no vitals filed for this visit.  Subjective Assessment - 08/29/17 0843    Subjective  My left side/hip aches this AM.     Pertinent History  hysterectomy 04/2017    Limitations  Standing;Walking;Lifting    How long can you walk comfortably?  exhaustion with walking     Patient Stated Goals  care for her twin grand children, improve endurance for walking, reduce LBP and thoracic pain, establish a regular weight training routine    Currently in Pain?  Yes    Pain Score  7     Pain Location  Hip    Pain Orientation  Left    Pain Descriptors / Indicators  Aching    Multiple Pain Sites  No                      [facility] Adult PT Treatment/Exercise - 08/29/17 0001      Lumbar Exercises: Stretches   Active Hamstring Stretch  -- Supine LT hip circles with yoga strap:then stretch hams 3x    Lower Trunk Rotation  -- Rocking 20x, then 2 x 20 sec bil  holds      Lumbar Exercises: Supine   Clam  -- green band with PPT 10x    Bridge  -- Added green band 2x6      Knee/Hip Exercises: Aerobic   Nustep  Level 2 x 10 minutes PTA present to discuss progress      Knee/Hip Exercises: Supine   Straight Leg Raises  -- 2x6 VC for hip length to lessen QL             PT Education - 08/29/17 0904    Education provided  Yes    Education Details  Supine core and hip strengthening exs for HEP progression, gave pt gren band for HEP    Person(s) Educated  Patient    Methods  Explanation;Demonstration;Verbal cues;Tactile cues;Handout    Comprehension  Verbalized understanding;Returned demonstration       PT Short Term Goals - 08/22/17 1233      PT SHORT TERM GOAL #1   Title  be independent in initial HEP    Status  Achieved      PT SHORT TERM GOAL #2   Title  verbalize and demonstrate body  mechanics modifications for lumbar protection with housework and care of grandchildren    Status  Achieved        PT Long Term Goals - 08/09/17 1331      PT LONG TERM GOAL #1   Title  be independent in advanced HEP    Time  8    Period  Weeks    Status  New    Target Date  10/04/17      PT LONG TERM GOAL #2   Title  reduce FOTO to < or = to 38% limitation    Time  8    Period  Weeks    Status  New    Target Date  10/04/17      PT LONG TERM GOAL #3   Title  improve strength and endurance to walk for exercise without significant fatigue    Time  8    Period  Weeks    Status  New    Target Date  10/04/17      PT LONG TERM GOAL #4   Title  report < or = to 3/10 LBP at the end of the day    Time  8    Period  Weeks    Status  New    Target Date  10/04/17      PT LONG TERM GOAL #5   Title  verbalize safe progression of core strength and weight training exercise program and perform without increased pain    Time  8    Period  Weeks    Status  New    Target Date  10/04/17            Plan - 08/29/17 0840    Clinical Impression Statement  Pt presented today with Lt lateral hip pain. This pain did not limit her ability to perform core and hip strengthening exercises in addition to hip stretching. HEp was progressed today. Since we could not dry needle today we tried the estim for her pain at the end of the session.     Rehab Potential  Good    PT Frequency  2x / week    PT Duration  8 weeks    PT Treatment/Interventions  ADLs/Self Care Home Management;Cryotherapy;Electrical Stimulation;Ultrasound;Therapeutic activities;Therapeutic exercise;Patient/family education;Neuromuscular re-education;Manual techniques;Passive range of motion;Taping;Dry needling  PT Next Visit Plan  Assess response to todays exercises. Review them, continue with glute strength, DN if seen by PT. See if pt got pain relief from the Estim??    Consulted and Agree with Plan of Care  Patient        Patient will benefit from skilled therapeutic intervention in order to improve the following deficits and impairments:  Decreased activity tolerance, Decreased endurance, Decreased strength, Increased muscle spasms, Pain, Improper body mechanics, Impaired flexibility  Visit Diagnosis: Chronic bilateral low back pain without sciatica  Pain in thoracic spine  Muscle weakness (generalized)     Problem List Patient Active Problem List   Diagnosis Date Noted  . Status post laparoscopy-assisted vaginal hysterectomy 04/24/2017  . Dry eye syndrome 03/17/2016  . Headache 03/17/2016  . Hyperlipidemia 03/17/2016  . Lack of bladder control 03/17/2016  . Migraines 03/17/2016  . 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 MGD (meibomian gland disease) 02/27/2012    Georgia Baria, PTA 08/29/2017, 9:32 AM  [facility] [address], [address] [place], [place], [address] Phone: [phone]   Fax:  [phone]  Name: [name] MRN: [id number] Date of Birth: June 27, 1951

## 2017-09-03 ENCOUNTER — Ambulatory Visit: Payer: Medicare Other

## 2017-09-03 DIAGNOSIS — M546 Pain in thoracic spine: Secondary | ICD-10-CM

## 2017-09-03 DIAGNOSIS — M6281 Muscle weakness (generalized): Secondary | ICD-10-CM

## 2017-09-03 DIAGNOSIS — M545 Low back pain, unspecified: Secondary | ICD-10-CM

## 2017-09-03 DIAGNOSIS — G8929 Other chronic pain: Secondary | ICD-10-CM

## 2017-09-03 NOTE — Therapy (Signed)
[facility] [address], [address] [place], [place], [address] Phone: [phone]   Fax:  [phone]  Physical Therapy Treatment  Patient Details  Name: [name] MRN: [id number] Date of Birth: 1951-05-12 Referring Provider: Shaune Pollack, MD   Encounter Date: 09/03/2017  PT End of Session - 09/03/17 1238    Visit Number  7    Date for PT Re-Evaluation  10/04/17    PT Start Time  1147    PT Stop Time  1249    PT Time Calculation (min)  62 min    Activity Tolerance  Patient tolerated treatment well    Behavior During Therapy  [facility] for tasks assessed/performed       Past Medical History:  Diagnosis Date  . Abnormal Pap smear of cervix 07/2016   CIN1  . Constipation   . Depression   . Diverticulosis   . H. pylori infection   . History of blood transfusion 1984  . Hx of colonic polyps   . Hypercholesteremia   . Hyponatremia   . Migraines   . Vitamin D deficiency     Past Surgical History:  Procedure Laterality Date  . ANTERIOR AND POSTERIOR REPAIR N/A 04/24/2017   Procedure: ANTERIOR (CYSTOCELE) AND POSTERIOR (RECTOCELE)  REPAIR;  Surgeon: Patton Salles, MD;  Location: [facility];  Service: Gynecology;  Laterality: N/A;  . CESAREAN SECTION  1984  . COLONOSCOPY  2012  . COSMETIC SURGERY     Neck lift  . CYSTOSCOPY N/A 04/24/2017   Procedure: CYSTOSCOPY;  Surgeon: Patton Salles, MD;  [facility]: [facility];  Service: Gynecology;  Laterality: N/A;  . HEMORRHOID SURGERY  1984  . LAPAROSCOPIC LYSIS OF ADHESIONS N/A 04/24/2017   Procedure: EXTENSIVE LAPAROSCOPIC LYSIS OF ADHESIONS;  Surgeon: Patton Salles, MD;  [facility];  Service: Gynecology;  Laterality: N/A;  . LAPAROSCOPIC VAGINAL HYSTERECTOMY WITH SALPINGO OOPHORECTOMY Bilateral 04/24/2017   Procedure: LAPAROSCOPIC ASSISTED VAGINAL HYSTERECTOMY WITH SALPINGO OOPHORECTOMY and pelvic washings;  Surgeon: Patton Salles,  MD;  [facility];  Service: Gynecology;  Laterality: Bilateral;  3 hours  . RECTAL PROLAPSE REPAIR     Dr. Kendrick Ranch  . UMBILICAL HERNIA REPAIR     had surgery as a child.    There were no vitals filed for this visit.  Subjective Assessment - 09/03/17 1144    Subjective  I am sore from exercises.      Patient Stated Goals  care for her twin grand children, improve endurance for walking, reduce LBP and thoracic pain, establish a regular weight training routine    Currently in Pain?  Yes    Pain Score  5     Pain Location  Hip    Pain Orientation  Left    Pain Descriptors / Indicators  Aching    Pain Type  Chronic pain    Pain Onset  More than a month ago    Pain Frequency  Constant    Aggravating Factors   it just starts, activity, walking    Pain Relieving Factors  rest, stretching                      [facility] Adult PT Treatment/Exercise - 09/03/17 0001      Lumbar Exercises: Stretches   Active Hamstring Stretch  3 reps;20 seconds    Lower Trunk Rotation  -- Rocking 20x, then 2 x  20 sec bil holds    Hip Flexor Stretch  Left;3 reps;20 seconds      Lumbar Exercises: Prone   Straight Leg Raise  20 reps      Knee/Hip Exercises: Aerobic   Nustep  Level 2 x 8 minutes PT present to discuss progress      Moist Heat Therapy   Number Minutes Moist Heat  12 Minutes    Moist Heat Location  Lumbar Spine;Hip      Manual Therapy   Manual Therapy  Myofascial release;Soft tissue mobilization    Manual therapy comments  soft tissue elongation to Lt>Rt gluteals and lumbar paraspinals, PA mobs L1-5       Trigger Point Dry Needling - 09/03/17 1209    Consent Given?  Yes    Muscles Treated Upper Body  -- bil lumbar multifidi    Muscles Treated Lower Body  Gluteus maximus;Gluteus minimus Lt             PT Short Term Goals - 09/03/17 1152      PT SHORT TERM GOAL #3   Title  report < or = to 4/10 LBP at the end of the day    Baseline  up to 6/10    Time  4     Period  Weeks    Status  On-going      PT SHORT TERM GOAL #4   Title  return to regular walking routine for exercise and verbalize understanding of safe progression    Baseline  walking 1 mile daily as able    Time  4    Period  Weeks    Status  On-going        PT Long Term Goals - 08/09/17 1331      PT LONG TERM GOAL #1   Title  be independent in advanced HEP    Time  8    Period  Weeks    Status  New    Target Date  10/04/17      PT LONG TERM GOAL #2   Title  reduce FOTO to < or = to 38% limitation    Time  8    Period  Weeks    Status  New    Target Date  10/04/17      PT LONG TERM GOAL #3   Title  improve strength and endurance to walk for exercise without significant fatigue    Time  8    Period  Weeks    Status  New    Target Date  10/04/17      PT LONG TERM GOAL #4   Title  report < or = to 3/10 LBP at the end of the day    Time  8    Period  Weeks    Status  New    Target Date  10/04/17      PT LONG TERM GOAL #5   Title  verbalize safe progression of core strength and weight training exercise program and perform without increased pain    Time  8    Period  Weeks    Status  New    Target Date  10/04/17            Plan - 09/03/17 1204    Clinical Impression Statement  Pt reports 50% overall improvement since the start of care.  Lt hip continues to be painful and pt is working on exercises to address this.  Pt with  trigger points in Lt gluteals and demonstrated improved tissue mobility and reduced pain after dry needling to lumbar spine and Lt gluteals.  Pt will continue to benefit from skilled PT for core strength, hip stabilization, flexibility and manual as needed.     Rehab Potential  Good    PT Frequency  2x / week    PT Duration  8 weeks    PT Treatment/Interventions  ADLs/Self Care Home Management;Cryotherapy;Electrical Stimulation;Ultrasound;Therapeutic activities;Therapeutic exercise;Patient/family education;Neuromuscular  re-education;Manual techniques;Passive range of motion;Taping;Dry needling    PT Next Visit Plan  Assess response to dry needling. Review exercises issued last week.      Consulted and Agree with Plan of Care  Patient       Patient will benefit from skilled therapeutic intervention in order to improve the following deficits and impairments:  Decreased activity tolerance, Decreased endurance, Decreased strength, Increased muscle spasms, Pain, Improper body mechanics, Impaired flexibility  Visit Diagnosis: Chronic bilateral low back pain without sciatica  Pain in thoracic spine  Muscle weakness (generalized)     Problem List Patient Active Problem List   Diagnosis Date Noted  . Status post laparoscopy-assisted vaginal hysterectomy 04/24/2017  . Dry eye syndrome 03/17/2016  . Headache 03/17/2016  . Hyperlipidemia 03/17/2016  . Lack of bladder control 03/17/2016  . Migraines 03/17/2016  . 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 MGD (meibomian gland disease) 02/27/2012     Lorrene Reid, PT 09/03/17 12:46 PM  [facility] [address], [address] [place], [place], [address] Phone: [phone]   Fax:  [phone]  Name: [name] MRN: [id number] Date of Birth: 1951/01/27

## 2017-09-05 ENCOUNTER — Ambulatory Visit: Payer: Medicare Other | Admitting: Physical Therapy

## 2017-09-05 ENCOUNTER — Encounter: Payer: Self-pay | Admitting: Physical Therapy

## 2017-09-05 DIAGNOSIS — M545 Low back pain, unspecified: Secondary | ICD-10-CM

## 2017-09-05 DIAGNOSIS — G8929 Other chronic pain: Secondary | ICD-10-CM

## 2017-09-05 DIAGNOSIS — M6281 Muscle weakness (generalized): Secondary | ICD-10-CM

## 2017-09-05 DIAGNOSIS — M546 Pain in thoracic spine: Secondary | ICD-10-CM

## 2017-09-05 NOTE — Therapy (Signed)
[facility] [address] [place], [place], [address] Phone: [phone]   Fax:  [phone]  Physical Therapy Treatment  Patient Details  Name: [name] MRN: [id number] Date of Birth: 07-28-1951 Referring Provider: Shaune Pollack, MD   Encounter Date: 09/05/2017  PT End of Session - 09/05/17 1054    Visit Number  8    Date for PT Re-Evaluation  10/04/17    PT Start Time  1054    PT Stop Time  1134    PT Time Calculation (min)  40 min    Activity Tolerance  Patient tolerated treatment well    Behavior During Therapy  [facility] for tasks assessed/performed       Past Medical History:  Diagnosis Date  . Abnormal Pap smear of cervix 07/2016   CIN1  . Constipation   . Depression   . Diverticulosis   . H. pylori infection   . History of blood transfusion 1984  . Hx of colonic polyps   . Hypercholesteremia   . Hyponatremia   . Migraines   . Vitamin D deficiency     Past Surgical History:  Procedure Laterality Date  . ANTERIOR AND POSTERIOR REPAIR N/A 04/24/2017   Procedure: ANTERIOR (CYSTOCELE) AND POSTERIOR (RECTOCELE)  REPAIR;  Surgeon: Patton Salles, MD;  Location: [facility];  Service: Gynecology;  Laterality: N/A;  . CESAREAN SECTION  1984  . COLONOSCOPY  2012  . COSMETIC SURGERY     Neck lift  . CYSTOSCOPY N/A 04/24/2017   Procedure: CYSTOSCOPY;  Surgeon: Patton Salles, MD;  Location: [facility];  Service: Gynecology;  Laterality: N/A;  . HEMORRHOID SURGERY  1984  . LAPAROSCOPIC LYSIS OF ADHESIONS N/A 04/24/2017   Procedure: EXTENSIVE LAPAROSCOPIC LYSIS OF ADHESIONS;  Surgeon: Patton Salles, MD;  Location: [facility];  Service: Gynecology;  Laterality: N/A;  . LAPAROSCOPIC VAGINAL HYSTERECTOMY WITH SALPINGO OOPHORECTOMY Bilateral 04/24/2017   Procedure: LAPAROSCOPIC ASSISTED VAGINAL HYSTERECTOMY WITH SALPINGO OOPHORECTOMY and pelvic washings;  Surgeon: Patton Salles,  MD;  Location: [facility];  Service: Gynecology;  Laterality: Bilateral;  3 hours  . RECTAL PROLAPSE REPAIR     Dr. Kendrick Ranch  . UMBILICAL HERNIA REPAIR     had surgery as a child.    There were no vitals filed for this visit.  Subjective Assessment - 09/05/17 1058    Subjective  I was not too sore from needling. Overall everything is improving.     Pertinent History  hysterectomy 04/2017    Limitations  Standing;Walking;Lifting    How long can you walk comfortably?  exhaustion with walking     Patient Stated Goals  care for her twin grand children, improve endurance for walking, reduce LBP and thoracic pain, establish a regular weight training routine    Currently in Pain?  Yes    Pain Score  3     Pain Location  Hip    Pain Orientation  Left    Pain Descriptors / Indicators  Dull    Multiple Pain Sites  No                      [facility] Adult PT Treatment/Exercise - 09/05/17 0001      Lumbar Exercises: Stretches   Single Knee to Chest Stretch  Right;Left;2 reps;30 seconds      Lumbar Exercises: Supine   Bridge  -- 8x  Other Supine Lumbar Exercises  knee fold ex: knees to table top one at a time 5x2    Other Supine Lumbar Exercises  In table top: alt toe taps 4x, VC for technique      Lumbar Exercises: Sidelying   Clam  -- yellow 5x bil:VC/TC for correct technique      Knee/Hip Exercises: Aerobic   Nustep  L2 x 10 min PTA present       Knee/Hip Exercises: Standing   Knee Flexion  Strengthening;Both;1 set;10 reps 2#    Hip Abduction  Stengthening;Both;1 set;10 reps;Knee straight    Abduction Limitations  2#      Knee/Hip Exercises: Seated   Sit to Sand  1 set;10 reps;without UE support Yellow band horizontal abd upon standing               PT Short Term Goals - 09/03/17 1152      PT SHORT TERM GOAL #3   Title  report < or = to 4/10 LBP at the end of the day    Baseline  up to 6/10    Time  4    Period  Weeks    Status  On-going      PT SHORT  TERM GOAL #4   Title  return to regular walking routine for exercise and verbalize understanding of safe progression    Baseline  walking 1 mile daily as able    Time  4    Period  Weeks    Status  On-going        PT Long Term Goals - 08/09/17 1331      PT LONG TERM GOAL #1   Title  be independent in advanced HEP    Time  8    Period  Weeks    Status  New    Target Date  10/04/17      PT LONG TERM GOAL #2   Title  reduce FOTO to < or = to 38% limitation    Time  8    Period  Weeks    Status  New    Target Date  10/04/17      PT LONG TERM GOAL #3   Title  improve strength and endurance to walk for exercise without significant fatigue    Time  8    Period  Weeks    Status  New    Target Date  10/04/17      PT LONG TERM GOAL #4   Title  report < or = to 3/10 LBP at the end of the day    Time  8    Period  Weeks    Status  New    Target Date  10/04/17      PT LONG TERM GOAL #5   Title  verbalize safe progression of core strength and weight training exercise program and perform without increased pain    Time  8    Period  Weeks    Status  New    Target Date  10/04/17            Plan - 09/05/17 1054    Clinical Impression Statement  Introduced more difficult exercises into pt's program today. This included standing hip strength with 2# wts, exercises with multiple movements pt had to coordinate, and more advanced core work in supine. Pt had no increased pain with these exercises.     Rehab Potential  Good    PT Frequency  2x / week    PT Duration  8 weeks    PT Treatment/Interventions  ADLs/Self Care Home Management;Cryotherapy;Electrical Stimulation;Ultrasound;Therapeutic activities;Therapeutic exercise;Patient/family education;Neuromuscular re-education;Manual techniques;Passive range of motion;Taping;Dry needling    PT Next Visit Plan  Dry needling next session, see how pt did with exercises from todays session.     Consulted and Agree with Plan of Care   Patient       Patient will benefit from skilled therapeutic intervention in order to improve the following deficits and impairments:  Decreased activity tolerance, Decreased endurance, Decreased strength, Increased muscle spasms, Pain, Improper body mechanics, Impaired flexibility  Visit Diagnosis: Chronic bilateral low back pain without sciatica  Pain in thoracic spine  Muscle weakness (generalized)     Problem List Patient Active Problem List   Diagnosis Date Noted  . Status post laparoscopy-assisted vaginal hysterectomy 04/24/2017  . Dry eye syndrome 03/17/2016  . Headache 03/17/2016  . Hyperlipidemia 03/17/2016  . Lack of bladder control 03/17/2016  . Migraines 03/17/2016  . 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 MGD (meibomian gland disease) 02/27/2012    Amelia Burgard, PTA 09/05/2017, 11:40 AM  [facility] [address], [address] [place], [place], [address] Phone: [phone]   Fax:  [phone]  Name: FLOSSIE WEXLER MRN: [id number] Date of Birth: [birth year]

## 2017-09-10 ENCOUNTER — Ambulatory Visit: Payer: Medicare Other | Attending: Family Medicine

## 2017-09-10 DIAGNOSIS — M546 Pain in thoracic spine: Secondary | ICD-10-CM | POA: Insufficient documentation

## 2017-09-10 DIAGNOSIS — G8929 Other chronic pain: Secondary | ICD-10-CM | POA: Insufficient documentation

## 2017-09-10 DIAGNOSIS — M545 Low back pain, unspecified: Secondary | ICD-10-CM

## 2017-09-10 DIAGNOSIS — M6281 Muscle weakness (generalized): Secondary | ICD-10-CM | POA: Diagnosis present

## 2017-09-10 DIAGNOSIS — M25552 Pain in left hip: Secondary | ICD-10-CM | POA: Insufficient documentation

## 2017-09-10 NOTE — Therapy (Signed)
[facility] [address] [place], [place], [address] Phone: [phone]   Fax:  [phone]  Physical Therapy Treatment  Patient Details  Name: [name] MRN: [id number] Date of Birth: [birth year] Referring Provider: Shaune Pollack, MD   Encounter Date: 09/10/2017  PT End of Session - 09/10/17 1233    Visit Number  9    Date for PT Re-Evaluation  10/04/17    PT Start Time  1148    PT Stop Time  1228    PT Time Calculation (min)  40 min    Activity Tolerance  Patient tolerated treatment well    Behavior During Therapy  [facility] for tasks assessed/performed       Past Medical History:  Diagnosis Date  . Abnormal Pap smear of cervix 07/2016   CIN1  . Constipation   . Depression   . Diverticulosis   . H. pylori infection   . History of blood transfusion 1984  . Hx of colonic polyps   . Hypercholesteremia   . Hyponatremia   . Migraines   . Vitamin D deficiency     Past Surgical History:  Procedure Laterality Date  . ANTERIOR AND POSTERIOR REPAIR N/A 04/24/2017   Procedure: ANTERIOR (CYSTOCELE) AND POSTERIOR (RECTOCELE)  REPAIR;  Surgeon: Patton Salles, MD;  [facility]: [facility];  Service: Gynecology;  Laterality: N/A;  . CESAREAN SECTION  1984  . COLONOSCOPY  2012  . COSMETIC SURGERY     Neck lift  . CYSTOSCOPY N/A 04/24/2017   Procedure: CYSTOSCOPY;  Surgeon: Patton Salles, MD;  [facility]: [facility];  Service: Gynecology;  Laterality: N/A;  . HEMORRHOID SURGERY  1984  . LAPAROSCOPIC LYSIS OF ADHESIONS N/A 04/24/2017   Procedure: EXTENSIVE LAPAROSCOPIC LYSIS OF ADHESIONS;  Surgeon: Patton Salles, MD;  [facility];  Service: Gynecology;  Laterality: N/A;  . LAPAROSCOPIC VAGINAL HYSTERECTOMY WITH SALPINGO OOPHORECTOMY Bilateral 04/24/2017   Procedure: LAPAROSCOPIC ASSISTED VAGINAL HYSTERECTOMY WITH SALPINGO OOPHORECTOMY and pelvic washings;  Surgeon: Patton Salles,  MD;  Location: [facility];  Service: Gynecology;  Laterality: Bilateral;  3 hours  . RECTAL PROLAPSE REPAIR     Dr. Kendrick Ranch  . UMBILICAL HERNIA REPAIR     had surgery as a child.    There were no vitals filed for this visit.  Subjective Assessment - 09/10/17 1150    Subjective  I am doing well.  I want to try exercise today and see how i do without exercise.      Currently in Pain?  Yes    Pain Score  7  no back pain    Pain Location  Hip    Pain Orientation  Left    Pain Descriptors / Indicators  Aching;Dull    Pain Type  Chronic pain    Pain Onset  More than a month ago    Pain Frequency  Constant    Aggravating Factors   activity, hip                      [facility] Adult PT Treatment/Exercise - 09/10/17 0001      Lumbar Exercises: Stretches   Active Hamstring Stretch  3 reps;20 seconds    Piriformis Stretch  3 reps;20 seconds      Lumbar Exercises: Standing   Row  Power tower;Both;20 reps;Other (comment) 25#      Knee/Hip Exercises: Aerobic   Nustep  L2 x 10 min PT present       Knee/Hip Exercises: Standing   Hip Abduction  Stengthening;Both;1 set;10 reps;Knee straight    Abduction Limitations  2#    Hip Extension  Stengthening;2 sets;10 reps;Knee straight    Extension Limitations  2#    Rebounder  weight shifting 3 ways x 1 minute      Knee/Hip Exercises: Seated   Sit to Sand  1 set;10 reps;without UE support               PT Short Term Goals - 09/10/17 1201      PT SHORT TERM GOAL #3   Title  report < or = to 4/10 LBP at the end of the day    Baseline  7/10 Lt hip    Time  4    Period  Weeks    Status  On-going        PT Long Term Goals - 09/10/17 1202      PT LONG TERM GOAL #1   Title  be independent in advanced HEP    Time  8    Period  Weeks    Status  On-going      PT LONG TERM GOAL #2   Title  reduce FOTO to < or = to 38% limitation    Time  8    Period  Weeks    Status  On-going      PT LONG TERM GOAL #3   Title   improve strength and endurance to walk for exercise without significant fatigue    Baseline  fatigue     Time  8    Period  Weeks    Status  On-going            Plan - 09/10/17 1203    Clinical Impression Statement  Pt reports 60% overall improvement in symptoms since the start of care.  Pt was able to tolerate all exercise in the clinic today.  Pt with no back pain and has had increased Lt hip pain over the past few days.  Pt with limited core strength and endurance for standing activity and will continue to benefit from skilled PT for strength, endurance and flexibility.      Rehab Potential  Good    PT Frequency  2x / week    PT Duration  8 weeks    PT Treatment/Interventions  ADLs/Self Care Home Management;Cryotherapy;Electrical Stimulation;Ultrasound;Therapeutic activities;Therapeutic exercise;Patient/family education;Neuromuscular re-education;Manual techniques;Passive range of motion;Taping;Dry needling    PT Next Visit Plan  exercise progression, strength, flexibility    Consulted and Agree with Plan of Care  Patient       Patient will benefit from skilled therapeutic intervention in order to improve the following deficits and impairments:  Decreased activity tolerance, Decreased endurance, Decreased strength, Increased muscle spasms, Pain, Improper body mechanics, Impaired flexibility  Visit Diagnosis: Chronic bilateral low back pain without sciatica  Pain in thoracic spine  Muscle weakness (generalized)     Problem List Patient Active Problem List   Diagnosis Date Noted  . Status post laparoscopy-assisted vaginal hysterectomy 04/24/2017  . Dry eye syndrome 03/17/2016  . Headache 03/17/2016  . Hyperlipidemia 03/17/2016  . Lack of bladder control 03/17/2016  . Migraines 03/17/2016  . 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 MGD (meibomian gland disease) 02/27/2012     Lorrene ReidKelly Zeric Baranowski, PT 09/10/17 12:35 PM  [facility] [address] [place], [place], [address]  Phone: [phone]   Fax:  [phone]  Name: [name] MRN: [id number] Date of Birth: April 12, 1951

## 2017-09-12 ENCOUNTER — Ambulatory Visit: Payer: Medicare Other

## 2017-09-12 DIAGNOSIS — M545 Low back pain, unspecified: Secondary | ICD-10-CM

## 2017-09-12 DIAGNOSIS — M6281 Muscle weakness (generalized): Secondary | ICD-10-CM

## 2017-09-12 DIAGNOSIS — M546 Pain in thoracic spine: Secondary | ICD-10-CM

## 2017-09-12 DIAGNOSIS — G8929 Other chronic pain: Secondary | ICD-10-CM

## 2017-09-12 NOTE — Therapy (Signed)
[facility] [address], [address] [place], [place], [address] Phone: [phone]   Fax:  [phone]  Physical Therapy Treatment  Patient Details  Name: [name] MRN: [id number] Date of Birth: Aug 15, 1950 Referring Provider: Shaune Pollack, MD   Encounter Date: 09/12/2017  PT End of Session - 09/12/17 1256    Visit Number  10    Date for PT Re-Evaluation  10/04/17    Authorization Type  Medicare    PT Start Time  1204    PT Stop Time  1250    PT Time Calculation (min)  46 min    Activity Tolerance  Patient tolerated treatment well    Behavior During Therapy  [facility] for tasks assessed/performed       Past Medical History:  Diagnosis Date  . Abnormal Pap smear of cervix 07/2016   CIN1  . Constipation   . Depression   . Diverticulosis   . H. pylori infection   . History of blood transfusion 1984  . Hx of colonic polyps   . Hypercholesteremia   . Hyponatremia   . Migraines   . Vitamin D deficiency     Past Surgical History:  Procedure Laterality Date  . ANTERIOR AND POSTERIOR REPAIR N/A 04/24/2017   Procedure: ANTERIOR (CYSTOCELE) AND POSTERIOR (RECTOCELE)  REPAIR;  Surgeon: Patton Salles, MD;  Location: [facility];  Service: Gynecology;  Laterality: N/A;  . CESAREAN SECTION  1984  . COLONOSCOPY  2012  . COSMETIC SURGERY     Neck lift  . CYSTOSCOPY N/A 04/24/2017   Procedure: CYSTOSCOPY;  Surgeon: Patton Salles, MD;  [facility]: [facility];  Service: Gynecology;  Laterality: N/A;  . HEMORRHOID SURGERY  1984  . LAPAROSCOPIC LYSIS OF ADHESIONS N/A 04/24/2017   Procedure: EXTENSIVE LAPAROSCOPIC LYSIS OF ADHESIONS;  Surgeon: Patton Salles, MD;  [facility]: [facility];  Service: Gynecology;  Laterality: N/A;  . LAPAROSCOPIC VAGINAL HYSTERECTOMY WITH SALPINGO OOPHORECTOMY Bilateral 04/24/2017   Procedure: LAPAROSCOPIC ASSISTED VAGINAL HYSTERECTOMY WITH SALPINGO OOPHORECTOMY and pelvic washings;   Surgeon: Patton Salles, MD;  [facility]: [facility];  Service: Gynecology;  Laterality: Bilateral;  3 hours  . RECTAL PROLAPSE REPAIR     Dr. Kendrick Ranch  . UMBILICAL HERNIA REPAIR     had surgery as a child.    There were no vitals filed for this visit.  Subjective Assessment - 09/12/17 1208    Subjective  My Lt hip is still bothering me.      Currently in Pain?  Yes    Pain Score  5     Pain Location  Hip    Pain Orientation  Left    Pain Descriptors / Indicators  Aching;Dull                      [facility] Adult PT Treatment/Exercise - 09/12/17 0001      Lumbar Exercises: Stretches   Active Hamstring Stretch  3 reps;20 seconds    Piriformis Stretch  3 reps;20 seconds      Lumbar Exercises: Standing   Row  Strengthening;Both;20 reps;Theraband    Theraband Level (Row)  Level 3 (Green)    Shoulder Extension  Strengthening;Both;20 reps;Theraband    Theraband Level (Shoulder Extension)  Level 3 (Green)      Knee/Hip Exercises: Aerobic   Nustep  L2 x 10 min PT present       Knee/Hip Exercises: Standing  Hip Abduction  --    Abduction Limitations  --    Hip Extension  --    Extension Limitations  --    Rebounder  weight shifting 3 ways x 1 minute      Knee/Hip Exercises: Seated   Sit to Sand  1 set;10 reps;without UE support      Manual Therapy   Manual Therapy  Myofascial release;Soft tissue mobilization    Manual therapy comments  orange roller over Lt hip flexor             PT Education - 09/12/17 1230    Education provided  Yes    Education Details  scapular theraband attached    Person(s) Educated  Patient    Methods  Explanation;Demonstration;Handout    Comprehension  Verbalized understanding;Returned demonstration       PT Short Term Goals - 09/10/17 1201      PT SHORT TERM GOAL #3   Title  report < or = to 4/10 LBP at the end of the day    Baseline  7/10 Lt hip    Time  4    Period  Weeks    Status  On-going        PT Long  Term Goals - 09/10/17 1202      PT LONG TERM GOAL #1   Title  be independent in advanced HEP    Time  8    Period  Weeks    Status  On-going      PT LONG TERM GOAL #2   Title  reduce FOTO to < or = to 38% limitation    Time  8    Period  Weeks    Status  On-going      PT LONG TERM GOAL #3   Title  improve strength and endurance to walk for exercise without significant fatigue    Baseline  fatigue     Time  8    Period  Weeks    Status  On-going            Plan - 09/12/17 1211    Clinical Impression Statement  Pt reports 60% overall improvement in symptoms since the start of care.  Pt tolerated all exercise in the clinic today.  No back pain reported and has 5/10 Lt hip pain today. Pt with trigger points in Lt hip flexor and demostrated improved tissue mobility and reported reduced pain after manual therapy today.  Pt has limited core strength and endurance for standing activity and will continue to benefit from skilled PT for strength, endurance and flexibility.    Rehab Potential  Good    PT Frequency  2x / week    PT Duration  8 weeks    PT Treatment/Interventions  ADLs/Self Care Home Management;Cryotherapy;Electrical Stimulation;Ultrasound;Therapeutic activities;Therapeutic exercise;Patient/family education;Neuromuscular re-education;Manual techniques;Passive range of motion;Taping;Dry needling    PT Next Visit Plan  exercise progression, strength, flexibility    Consulted and Agree with Plan of Care  Patient       Patient will benefit from skilled therapeutic intervention in order to improve the following deficits and impairments:  Decreased activity tolerance, Decreased endurance, Decreased strength, Increased muscle spasms, Pain, Improper body mechanics, Impaired flexibility  Visit Diagnosis: Chronic bilateral low back pain without sciatica  Pain in thoracic spine  Muscle weakness (generalized)     Problem List Patient Active Problem List   Diagnosis Date  Noted  . Status post laparoscopy-assisted vaginal hysterectomy 04/24/2017  .  Dry eye syndrome 03/17/2016  . Headache 03/17/2016  . Hyperlipidemia 03/17/2016  . Lack of bladder control 03/17/2016  . Migraines 03/17/2016  . 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 MGD (meibomian gland disease) 02/27/2012    Lorrene ReidKelly Yareli Carthen, PT 09/12/17 12:58 PM  [facility] [address], [address] [place], [place], [address] Phone: [phone]   Fax:  [phone]  Name: [name] MRN: [id number] Date of Birth: 09/21/1950

## 2017-09-12 NOTE — Patient Instructions (Addendum)
KEEP HEAD IN NEUTRAL AND SHOULDERS DOWN AND RELAXED   Hold tubing in right hand, arm forward. Pull arm back, elbow straight. Repeat __10__ times per set. Do __2__ sets per session. Do _1-2___ sessions per day.  Copyright  VHI. All rights reserved.     With resistive band anchored in door, grasp both ends. Keeping elbows bent, pull back, squeezing shoulder blades together. Hold _3__ seconds. Repeat _2x10___ times. Do _1-2___ sessions per day.  [email or web address].[email or web address]   [facility] [address], [address] [place], [place] [address] Phone # [phone] Fax [phone] 

## 2017-09-17 ENCOUNTER — Encounter: Payer: Self-pay | Admitting: Physical Therapy

## 2017-09-17 ENCOUNTER — Ambulatory Visit: Payer: Medicare Other | Admitting: Physical Therapy

## 2017-09-17 DIAGNOSIS — M6281 Muscle weakness (generalized): Secondary | ICD-10-CM

## 2017-09-17 DIAGNOSIS — M546 Pain in thoracic spine: Secondary | ICD-10-CM

## 2017-09-17 DIAGNOSIS — M545 Low back pain, unspecified: Secondary | ICD-10-CM

## 2017-09-17 DIAGNOSIS — G8929 Other chronic pain: Secondary | ICD-10-CM

## 2017-09-17 NOTE — Patient Instructions (Signed)
Level 2 intermediate exercises:  1. Knee fold exercise Laying on you back with knees bent, Engage your lower abdominals first. Then bring one foot off the floor and hold at 90 degree angle.  Deepen the lower abdominal contraction as your bring the other leg up to meet the other leg. You should feel your core "hug your spine" deeply. Hold for 2 sec, then lower your feet down. Repeat this 4x.  2. Dying bug exercise Laying on your back with knes bent. Engage your lower abdominals first, then raise the RT foot ( knee bent) AND lift the opposite arm over head. Really be sure you are engaging your abdominals deeply while you hold this for 3 sec before changing sides. Alternate sides 5-10 x. Breathe in whatever way feels best to you.   3.    Bracing With Arm Raise (Quadruped)  On hands and knees find neutral spine. Tighten pelvic floor and abdominals and hold. Alternately lift arm to shoulder level. Repeat 3-5__ times. Do _1-2__ times a day.  Quadruped Alternate Hip Extension   Shift weight to one side and raise opposite leg. Keep trunk steady. _5__ reps per set, _1-2__ times per day Repeat with other leg. * With your left hip you must be mindful to press your left knee into the mat/floor and FEEL the muscles around the hip contract. This will help engage them better. You do not have to do this on the right side. Only make sure your pelvis stays level when you lift your leg ( this is for both legs.)  Bracing With Arm / Leg Raise (Quadruped)  On hands and knees find neutral spine. Tighten pelvic floor and abdominals and hold. Alternating, lift arm to shoulder level and opposite leg to hip level. Repeat 3-5___ times. Do __2_ times a day.

## 2017-09-17 NOTE — Therapy (Signed)
[facility] [address], [address] [place], [place], [address] Phone: [phone]   Fax:  [phone]  Physical Therapy Treatment  Patient Details  Name: [name] MRN: [id number] Date of Birth: 07/26/1951 Referring Provider: Shaune PollackGates, Donna, MD   Encounter Date: 09/17/2017  PT End of Session - 09/17/17 1407    Visit Number  11    Date for PT Re-Evaluation  10/04/17    Authorization Type  Medicare    PT Start Time  1402    PT Stop Time  1444    PT Time Calculation (min)  42 min    Activity Tolerance  Patient tolerated treatment well    Behavior During Therapy  [facility] for tasks assessed/performed       Past Medical History:  Diagnosis Date  . Abnormal Pap smear of cervix 07/2016   CIN1  . Constipation   . Depression   . Diverticulosis   . H. pylori infection   . History of blood transfusion 1984  . Hx of colonic polyps   . Hypercholesteremia   . Hyponatremia   . Migraines   . Vitamin D deficiency     Past Surgical History:  Procedure Laterality Date  . ANTERIOR AND POSTERIOR REPAIR N/A 04/24/2017   Procedure: ANTERIOR (CYSTOCELE) AND POSTERIOR (RECTOCELE)  REPAIR;  Surgeon: Patton SallesAmundson C Silva, Brook E, MD;  Location: [facility];  Service: Gynecology;  Laterality: N/A;  . CESAREAN SECTION  1984  . COLONOSCOPY  2012  . COSMETIC SURGERY     Neck lift  . CYSTOSCOPY N/A 04/24/2017   Procedure: CYSTOSCOPY;  Surgeon: Patton SallesAmundson C Silva, Brook E, MD;  Location: [facility];  Service: Gynecology;  Laterality: N/A;  . HEMORRHOID SURGERY  1984  . LAPAROSCOPIC LYSIS OF ADHESIONS N/A 04/24/2017   Procedure: EXTENSIVE LAPAROSCOPIC LYSIS OF ADHESIONS;  Surgeon: Patton SallesAmundson C Silva, Brook E, MD;  [facility]: [facility];  Service: Gynecology;  Laterality: N/A;  . LAPAROSCOPIC VAGINAL HYSTERECTOMY WITH SALPINGO OOPHORECTOMY Bilateral 04/24/2017   Procedure: LAPAROSCOPIC ASSISTED VAGINAL HYSTERECTOMY WITH SALPINGO OOPHORECTOMY and pelvic washings;   Surgeon: Patton SallesAmundson C Silva, Brook E, MD;  Location: [facility];  Service: Gynecology;  Laterality: Bilateral;  3 hours  . RECTAL PROLAPSE REPAIR     Dr. Kendrick Ranchim Davis  . UMBILICAL HERNIA REPAIR     had surgery as a child.    There were no vitals filed for this visit.  Subjective Assessment - 09/17/17 1408    Subjective  Back is good, hip is a little sore today.    Pertinent History  hysterectomy 04/2017    Limitations  Standing;Walking;Lifting    How long can you walk comfortably?  exhaustion with walking     Patient Stated Goals  care for her twin grand children, improve endurance for walking, reduce LBP and thoracic pain, establish a regular weight training routine    Currently in Pain?  Yes    Pain Score  3     Pain Location  Hip    Pain Orientation  Left    Pain Descriptors / Indicators  Sore;Dull    Aggravating Factors   activity    Pain Relieving Factors  rest    Multiple Pain Sites  No                      [facility] Adult PT Treatment/Exercise - 09/17/17 0001      Knee/Hip Exercises: Aerobic   Nustep  L2 x 10 min  PTA present              PT Education - 09/17/17 1412    Education provided  Yes    Education Details  HEP progression: knee fold: dying bug, quadruped series    Person(s) Educated  Patient    Methods  Explanation;Demonstration;Tactile cues;Verbal cues;Handout    Comprehension  Returned demonstration;Verbalized understanding       PT Short Term Goals - 09/10/17 1201      PT SHORT TERM GOAL #3   Title  report < or = to 4/10 LBP at the end of the day    Baseline  7/10 Lt hip    Time  4    Period  Weeks    Status  On-going        PT Long Term Goals - 09/10/17 1202      PT LONG TERM GOAL #1   Title  be independent in advanced HEP    Time  8    Period  Weeks    Status  On-going      PT LONG TERM GOAL #2   Title  reduce FOTO to < or = to 38% limitation    Time  8    Period  Weeks    Status  On-going      PT LONG TERM GOAL #3   Title   improve strength and endurance to walk for exercise without significant fatigue    Baseline  fatigue     Time  8    Period  Weeks    Status  On-going            Plan - 09/17/17 1407    Clinical Impression Statement  Pt's HEP was updated today to include level 2 intermediate core strengthening exercises. These were challenging but pt could do them well enough to begin practicing at home. Most notably was pt's LT hip during RT hip extension. She has extreme difficulty not collapsing into her LT hip due to weakness. She required verbal and tactile cues to give her feedback  in order to correct this.      Rehab Potential  Good    PT Frequency  2x / week    PT Duration  8 weeks    PT Treatment/Interventions  ADLs/Self Care Home Management;Cryotherapy;Electrical Stimulation;Ultrasound;Therapeutic activities;Therapeutic exercise;Patient/family education;Neuromuscular re-education;Manual techniques;Passive range of motion;Taping;Dry needling    PT Next Visit Plan  Review todays exercises    Consulted and Agree with Plan of Care  Patient       Patient will benefit from skilled therapeutic intervention in order to improve the following deficits and impairments:  Decreased activity tolerance, Decreased endurance, Decreased strength, Increased muscle spasms, Pain, Improper body mechanics, Impaired flexibility  Visit Diagnosis: Chronic bilateral low back pain without sciatica  Pain in thoracic spine  Muscle weakness (generalized)     Problem List Patient Active Problem List   Diagnosis Date Noted  . Status post laparoscopy-assisted vaginal hysterectomy 04/24/2017  . Dry eye syndrome 03/17/2016  . Headache 03/17/2016  . Hyperlipidemia 03/17/2016  . Lack of bladder control 03/17/2016  . Migraines 03/17/2016  . 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 MGD (meibomian gland disease) 02/27/2012    Chastin Garlitz, PTA 09/17/2017, 2:48 PM  [facility] [address] [place], [place], [address] Phone: [phone]   Fax:  [phone]  Name: [name] MRN: [id number] Date of Birth: Mar 13, 1951

## 2017-09-19 ENCOUNTER — Ambulatory Visit: Payer: Medicare Other

## 2017-09-19 DIAGNOSIS — M6281 Muscle weakness (generalized): Secondary | ICD-10-CM

## 2017-09-19 DIAGNOSIS — M545 Low back pain, unspecified: Secondary | ICD-10-CM

## 2017-09-19 DIAGNOSIS — M546 Pain in thoracic spine: Secondary | ICD-10-CM

## 2017-09-19 DIAGNOSIS — G8929 Other chronic pain: Secondary | ICD-10-CM

## 2017-09-19 NOTE — Therapy (Signed)
[facility] [address], [address] [place], [place], [address] Phone: [phone]   Fax:  [phone]  Physical Therapy Treatment  Patient Details  Name: Whitney BellowsChristine H Peterson MRN: [id number] Date of Birth: 01/10/1951 Referring Provider: Shaune PollackGates, Donna, Peterson   Encounter Date: 09/19/2017  PT End of Session - 09/19/17 1010    Visit Number  12    Date for PT Re-Evaluation  10/04/17    Authorization Type  Medicare    PT Start Time  0930    PT Stop Time  1013    PT Time Calculation (min)  43 min    Activity Tolerance  Patient tolerated treatment well    Behavior During Therapy  [facility] for tasks assessed/performed       Past Medical History:  Diagnosis Date  . Abnormal Pap smear of cervix 07/2016   CIN1  . Constipation   . Depression   . Diverticulosis   . H. pylori infection   . History of blood transfusion 1984  . Hx of colonic polyps   . Hypercholesteremia   . Hyponatremia   . Migraines   . Vitamin D deficiency     Past Surgical History:  Procedure Laterality Date  . ANTERIOR AND POSTERIOR REPAIR N/A 04/24/2017   Procedure: ANTERIOR (CYSTOCELE) AND POSTERIOR (RECTOCELE)  REPAIR;  Surgeon: [name];  [facility]: [facility];  Service: Gynecology;  Laterality: N/A;  . CESAREAN SECTION  1984  . COLONOSCOPY  2012  . COSMETIC SURGERY     Neck lift  . CYSTOSCOPY N/A 04/24/2017   Procedure: CYSTOSCOPY;  Surgeon: [name];  [facility]: [facility];  Service: Gynecology;  Laterality: N/A;  . HEMORRHOID SURGERY  1984  . LAPAROSCOPIC LYSIS OF ADHESIONS N/A 04/24/2017   Procedure: EXTENSIVE LAPAROSCOPIC LYSIS OF ADHESIONS;  Surgeon: [name];  [facility];  Service: Gynecology;  Laterality: N/A;  . LAPAROSCOPIC VAGINAL HYSTERECTOMY WITH SALPINGO OOPHORECTOMY Bilateral 04/24/2017   Procedure: LAPAROSCOPIC ASSISTED VAGINAL HYSTERECTOMY WITH SALPINGO OOPHORECTOMY and pelvic washings;   Surgeon: [name];  [facility]: [facility];  Service: Gynecology;  Laterality: Bilateral;  3 hours  . RECTAL PROLAPSE REPAIR     Dr. Kendrick Ranchim Peterson  . UMBILICAL HERNIA REPAIR     had surgery as a child.    There were no vitals filed for this visit.  Subjective Assessment - 09/19/17 0937    Subjective  I am feeling good today.      Currently in Pain?  Yes    Pain Score  0-No pain    Pain Location  Back    Pain Orientation  Left    Pain Descriptors / Indicators  Sore;Dull                      [facility] Adult PT Treatment/Exercise - 09/19/17 0001      Lumbar Exercises: Stretches   Active Hamstring Stretch  3 reps;20 seconds      Lumbar Exercises: Aerobic   UBE (Upper Arm Bike)  Level 0x 6 minutes (3/3) seated on green ball for core activation      Lumbar Exercises: Supine   Other Supine Lumbar Exercises  dying bug and knee fold exercis x 10 each verbal cues required for core activation      Lumbar Exercises: Quadruped   Single Arm Raise  10 reps;Left;Right    Straight Leg Raise  10  reps tactile cues for stability      Knee/Hip Exercises: Aerobic   Nustep  L2 x 10 min PT present to discuss pain               PT Short Term Goals - 09/19/17 1308      PT SHORT TERM GOAL #3   Title  report < or = to 4/10 LBP at the end of the day    Baseline  this fluctuates- sometimes 0, sometimes 8/10    Status  On-going      PT SHORT TERM GOAL #4   Title  return to regular walking routine for exercise and verbalize understanding of safe progression    Baseline  walking 1 mile daily as able    Status  Achieved        PT Long Term Goals - 09/10/17 1202      PT LONG TERM GOAL #1   Title  be independent in advanced HEP    Time  8    Period  Weeks    Status  On-going      PT LONG TERM GOAL #2   Title  reduce FOTO to < or = to 38% limitation    Time  8    Period  Weeks    Status  On-going      PT LONG TERM GOAL #3   Title  improve strength and  endurance to walk for exercise without significant fatigue    Baseline  fatigue     Time  8    Period  Weeks    Status  On-going            Plan - 09/19/17 0944    Clinical Impression Statement  Pt reports 70% overall improvement in symptoms since the start of care.  Pt is still challenged by core strength exercises issued last session.  Pt without pain today and reportes that this varies based on activity and time of day.      Rehab Potential  Good    PT Frequency  2x / week    PT Duration  8 weeks    PT Treatment/Interventions  ADLs/Self Care Home Management;Cryotherapy;Electrical Stimulation;Ultrasound;Therapeutic activities;Therapeutic exercise;Patient/family education;Neuromuscular re-education;Manual techniques;Passive range of motion;Taping;Dry needling    PT Next Visit Plan  continue to build core  and postural strength.    Consulted and Agree with Plan of Care  Patient       Patient will benefit from skilled therapeutic intervention in order to improve the following deficits and impairments:  Decreased activity tolerance, Decreased endurance, Decreased strength, Increased muscle spasms, Pain, Improper body mechanics, Impaired flexibility  Visit Diagnosis: Chronic bilateral low back pain without sciatica  Pain in thoracic spine  Muscle weakness (generalized)     Problem List Patient Active Problem List   Diagnosis Date Noted  . Status post laparoscopy-assisted vaginal hysterectomy 04/24/2017  . Dry eye syndrome 03/17/2016  . Headache 03/17/2016  . Hyperlipidemia 03/17/2016  . Lack of bladder control 03/17/2016  . Migraines 03/17/2016  . 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 MGD (meibomian gland disease) 02/27/2012     Lorrene Reid, PT 09/19/17 10:16 AM   [facility] [address], [address] [place], [place], [address] Phone: [phone]   Fax:  [phone]  Name: [name] MRN:  [id number] Date of Birth: [birth year]

## 2017-09-24 ENCOUNTER — Ambulatory Visit: Payer: Medicare Other

## 2017-09-24 DIAGNOSIS — M546 Pain in thoracic spine: Secondary | ICD-10-CM

## 2017-09-24 DIAGNOSIS — M545 Low back pain, unspecified: Secondary | ICD-10-CM

## 2017-09-24 DIAGNOSIS — M6281 Muscle weakness (generalized): Secondary | ICD-10-CM

## 2017-09-24 DIAGNOSIS — G8929 Other chronic pain: Secondary | ICD-10-CM

## 2017-09-24 NOTE — Therapy (Signed)
[facility] [address] [place], [place], [address] Phone: [phone]   Fax:  [phone]  Physical Therapy Treatment  Patient Details  Name: [name] MRN: [id number] Date of Birth: [birth year] Referring Provider: Shaune Pollack, MD   Encounter Date: 09/24/2017  PT End of Session - 09/24/17 1147    Visit Number  13    Date for PT Re-Evaluation  10/04/17    Authorization Type  Medicare    PT Start Time  1100    PT Stop Time  1144    PT Time Calculation (min)  44 min    Activity Tolerance  Patient tolerated treatment well    Behavior During Therapy  [facility] for tasks assessed/performed       Past Medical History:  Diagnosis Date  . Abnormal Pap smear of cervix 07/2016   CIN1  . Constipation   . Depression   . Diverticulosis   . H. pylori infection   . History of blood transfusion 1984  . Hx of colonic polyps   . Hypercholesteremia   . Hyponatremia   . Migraines   . Vitamin D deficiency     Past Surgical History:  Procedure Laterality Date  . ANTERIOR AND POSTERIOR REPAIR N/A 04/24/2017   Procedure: ANTERIOR (CYSTOCELE) AND POSTERIOR (RECTOCELE)  REPAIR;  Surgeon: Patton Salles, MD;  Location: [facility];  Service: Gynecology;  Laterality: N/A;  . CESAREAN SECTION  1984  . COLONOSCOPY  2012  . COSMETIC SURGERY     Neck lift  . CYSTOSCOPY N/A 04/24/2017   Procedure: CYSTOSCOPY;  Surgeon: Patton Salles, MD;  [facility]: [facility];  Service: Gynecology;  Laterality: N/A;  . HEMORRHOID SURGERY  1984  . LAPAROSCOPIC LYSIS OF ADHESIONS N/A 04/24/2017   Procedure: EXTENSIVE LAPAROSCOPIC LYSIS OF ADHESIONS;  Surgeon: Patton Salles, MD;  Location: [facility];  Service: Gynecology;  Laterality: N/A;  . LAPAROSCOPIC VAGINAL HYSTERECTOMY WITH SALPINGO OOPHORECTOMY Bilateral 04/24/2017   Procedure: LAPAROSCOPIC ASSISTED VAGINAL HYSTERECTOMY WITH SALPINGO OOPHORECTOMY and pelvic washings;   Surgeon: Patton Salles, MD;  Location: [facility];  Service: Gynecology;  Laterality: Bilateral;  3 hours  . RECTAL PROLAPSE REPAIR     Dr. Kendrick Ranch  . UMBILICAL HERNIA REPAIR     had surgery as a child.    There were no vitals filed for this visit.  Subjective Assessment - 09/24/17 1114    Subjective  I overdid it yesterday.  I walked 2 miles and then had 5-6/10 Lt hip pain.      Pain Score  3     Pain Location  Back    Pain Orientation  Left    Pain Descriptors / Indicators  Sore;Dull    Pain Onset  More than a month ago    Pain Frequency  Constant    Aggravating Factors   doing too much, activity    Pain Relieving Factors  rest, exercises                      [facility] Adult PT Treatment/Exercise - 09/24/17 0001      Lumbar Exercises: Stretches   Active Hamstring Stretch  3 reps;20 seconds      Lumbar Exercises: Aerobic   UBE (Upper Arm Bike)  Level 0x 6 minutes (3/3) seated on green ball for core activation      Lumbar Exercises: Standing   Row  Strengthening;Both;20  reps;Power tower 20#    Shoulder Extension  Strengthening;Power Tower;Both;20 reps 20#      Lumbar Exercises: Supine   Bridge  20 reps red ball under legs    Other Supine Lumbar Exercises  dying bug and knee fold exercise x 10 each verbal cues required for core activation      Knee/Hip Exercises: Aerobic   Nustep  L2 x 10 min PT present to discuss pain               PT Short Term Goals - 09/19/17 [id number]      PT SHORT TERM GOAL #3   Title  report < or = to 4/10 LBP at the end of the day    Baseline  this fluctuates- sometimes 0, sometimes 8/10    Status  On-going      PT SHORT TERM GOAL #4   Title  return to regular walking routine for exercise and verbalize understanding of safe progression    Baseline  walking 1 mile daily as able    Status  Achieved        PT Long Term Goals - 09/10/17 1202      PT LONG TERM GOAL #1   Title  be independent in advanced HEP     Time  8    Period  Weeks    Status  On-going      PT LONG TERM GOAL #2   Title  reduce FOTO to < or = to 38% limitation    Time  8    Period  Weeks    Status  On-going      PT LONG TERM GOAL #3   Title  improve strength and endurance to walk for exercise without significant fatigue    Baseline  fatigue     Time  8    Period  Weeks    Status  On-going            Plan - 09/24/17 1126    Clinical Impression Statement  Pt reports 70% overall improvement since the start of care.  Pt was able to walk 2 miles over the weekend.  Pt reports fatigue today due to doing too much over the weekend.   Pt with core weakness and fatigued with core exercise today.  Pt with continued Rt hip pain but denies that this is limiting.  PT will continue to benefit from skilled PT for strength, flexiblity and endurance progression.      Rehab Potential  Good    PT Frequency  2x / week    PT Duration  8 weeks    PT Treatment/Interventions  ADLs/Self Care Home Management;Cryotherapy;Electrical Stimulation;Ultrasound;Therapeutic activities;Therapeutic exercise;Patient/family education;Neuromuscular re-education;Manual techniques;Passive range of motion;Taping;Dry needling    PT Next Visit Plan  continue to build core  and postural strength.  Work on Environmental managerendurance    Consulted and Agree with Plan of Care  Patient       Patient will benefit from skilled therapeutic intervention in order to improve the following deficits and impairments:  Decreased activity tolerance, Decreased endurance, Decreased strength, Increased muscle spasms, Pain, Improper body mechanics, Impaired flexibility  Visit Diagnosis: Chronic bilateral low back pain without sciatica  Pain in thoracic spine  Muscle weakness (generalized)     Problem List Patient Active Problem List   Diagnosis Date Noted  . Status post laparoscopy-assisted vaginal hysterectomy 04/24/2017  . Dry eye syndrome 03/17/2016  . Headache 03/17/2016  .  Hyperlipidemia 03/17/2016  . Lack  of bladder control 03/17/2016  . Migraines 03/17/2016  . 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 MGD (meibomian gland disease) 02/27/2012     Lorrene Reid, PT 09/24/17 11:49 AM  [facility] [address] [place], [place], [address] Phone: [phone]   Fax:  [phone]  Name: HANIYA FERN MRN: [id number] Date of Birth: [birth year]

## 2017-09-26 ENCOUNTER — Ambulatory Visit: Payer: Medicare Other

## 2017-09-26 DIAGNOSIS — M545 Low back pain, unspecified: Secondary | ICD-10-CM

## 2017-09-26 DIAGNOSIS — G8929 Other chronic pain: Secondary | ICD-10-CM

## 2017-09-26 DIAGNOSIS — M546 Pain in thoracic spine: Secondary | ICD-10-CM

## 2017-09-26 DIAGNOSIS — M6281 Muscle weakness (generalized): Secondary | ICD-10-CM

## 2017-09-26 NOTE — Therapy (Signed)
[facility] [address], [address] [place], [place], [address] Phone: [phone]   Fax:  [phone]  Physical Therapy Treatment  Patient Details  Name: [name] MRN: [id number] Date of Birth: 04/12/1951 Referring Provider: Shaune Pollack, MD   Encounter Date: 09/26/2017  PT End of Session - 09/26/17 1140    Visit Number  14    Date for PT Re-Evaluation  10/04/17    Authorization Type  Medicare    Authorization Time Period  KX at 15    PT Start Time  1100    PT Stop Time  1145    PT Time Calculation (min)  45 min    Activity Tolerance  Patient tolerated treatment well    Behavior During Therapy  [facility] for tasks assessed/performed       Past Medical History:  Diagnosis Date  . Abnormal Pap smear of cervix 07/2016   CIN1  . Constipation   . Depression   . Diverticulosis   . H. pylori infection   . History of blood transfusion 1984  . Hx of colonic polyps   . Hypercholesteremia   . Hyponatremia   . Migraines   . Vitamin D deficiency     Past Surgical History:  Procedure Laterality Date  . ANTERIOR AND POSTERIOR REPAIR N/A 04/24/2017   Procedure: ANTERIOR (CYSTOCELE) AND POSTERIOR (RECTOCELE)  REPAIR;  Surgeon: Patton Salles, MD;  Location: [facility];  Service: Gynecology;  Laterality: N/A;  . CESAREAN SECTION  1984  . COLONOSCOPY  2012  . COSMETIC SURGERY     Neck lift  . CYSTOSCOPY N/A 04/24/2017   Procedure: CYSTOSCOPY;  Surgeon: Patton Salles, MD;  Location: [facility];  Service: Gynecology;  Laterality: N/A;  . HEMORRHOID SURGERY  1984  . LAPAROSCOPIC LYSIS OF ADHESIONS N/A 04/24/2017   Procedure: EXTENSIVE LAPAROSCOPIC LYSIS OF ADHESIONS;  Surgeon: Patton Salles, MD;  Location: [facility];  Service: Gynecology;  Laterality: N/A;  . LAPAROSCOPIC VAGINAL HYSTERECTOMY WITH SALPINGO OOPHORECTOMY Bilateral 04/24/2017   Procedure: LAPAROSCOPIC ASSISTED VAGINAL HYSTERECTOMY WITH  SALPINGO OOPHORECTOMY and pelvic washings;  Surgeon: Patton Salles, MD;  Location: [facility];  Service: Gynecology;  Laterality: Bilateral;  3 hours  . RECTAL PROLAPSE REPAIR     Dr. Kendrick Ranch  . UMBILICAL HERNIA REPAIR     had surgery as a child.    There were no vitals filed for this visit.  Subjective Assessment - 09/26/17 1110    Subjective  I am feeling so much better.      Patient Stated Goals  care for her twin grand children, improve endurance for walking, reduce LBP and thoracic pain, establish a regular weight training routine    Currently in Pain?  Yes    Pain Score  3     Pain Location  Back    Pain Orientation  Left                      [facility] Adult PT Treatment/Exercise - 09/26/17 0001      Lumbar Exercises: Stretches   Active Hamstring Stretch  3 reps;20 seconds    Double Knee to Chest Stretch  3 reps;20 seconds      Lumbar Exercises: Aerobic   UBE (Upper Arm Bike)  Level 0x 6 minutes (3/3) seated on green ball for core activation      Lumbar Exercises: Standing   Row  Strengthening;Both;20 reps;Power tower 20#    Shoulder Extension  Strengthening;Power Tower;Both;20 reps 20#      Lumbar Exercises: Supine   Bridge  20 reps red ball under legs      Knee/Hip Exercises: Stretches   Piriformis Stretch  Both;3 reps;20 seconds      Knee/Hip Exercises: Aerobic   Nustep  L2 x 10 min PT present to discuss pain      Knee/Hip Exercises: Standing   Hip Abduction  Stengthening;Both;2 sets;10 reps using balance poles    Hip Extension  Stengthening;Both;2 sets;10 reps using balance poles               PT Short Term Goals - 09/19/17 1610      PT SHORT TERM GOAL #3   Title  report < or = to 4/10 LBP at the end of the day    Baseline  this fluctuates- sometimes 0, sometimes 8/10    Status  On-going      PT SHORT TERM GOAL #4   Title  return to regular walking routine for exercise and verbalize understanding of safe progression     Baseline  walking 1 mile daily as able    Status  Achieved        PT Long Term Goals - 09/10/17 1202      PT LONG TERM GOAL #1   Title  be independent in advanced HEP    Time  8    Period  Weeks    Status  On-going      PT LONG TERM GOAL #2   Title  reduce FOTO to < or = to 38% limitation    Time  8    Period  Weeks    Status  On-going      PT LONG TERM GOAL #3   Title  improve strength and endurance to walk for exercise without significant fatigue    Baseline  fatigue     Time  8    Period  Weeks    Status  On-going            Plan - 09/26/17 1141    Clinical Impression Statement  Pt reports 70% overall improvement since the start of care.  Pt with reduced pain today overall.  Pt did fatigue with exercise at the end of the session but tolerated well.  Pt requires verbal cues for posture, to reduce scapular elevation and for core activation.  Pt will conitnue to benefit from skilled PT for strength, flexibility and endurance progression.      Rehab Potential  Good    PT Frequency  2x / week    PT Duration  8 weeks    PT Treatment/Interventions  ADLs/Self Care Home Management;Cryotherapy;Electrical Stimulation;Ultrasound;Therapeutic activities;Therapeutic exercise;Patient/family education;Neuromuscular re-education;Manual techniques;Passive range of motion;Taping;Dry needling    PT Next Visit Plan  continue to build core  and postural strength.  Work on endurance.  ERO next week.    Consulted and Agree with Plan of Care  Patient       Patient will benefit from skilled therapeutic intervention in order to improve the following deficits and impairments:  Decreased activity tolerance, Decreased endurance, Decreased strength, Increased muscle spasms, Pain, Improper body mechanics, Impaired flexibility  Visit Diagnosis: Chronic bilateral low back pain without sciatica  Pain in thoracic spine  Muscle weakness (generalized)     Problem List Patient Active Problem  List   Diagnosis Date Noted  . Status post laparoscopy-assisted vaginal hysterectomy 04/24/2017  .  Dry eye syndrome 03/17/2016  . Headache 03/17/2016  . Hyperlipidemia 03/17/2016  . Lack of bladder control 03/17/2016  . Migraines 03/17/2016  . 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 MGD (meibomian gland disease) 02/27/2012    Lorrene ReidKelly Takacs, PT 09/26/17 11:44 AM  [facility] [address], [address] [place], [place], [address] Phone: [phone]   Fax:  [phone]  Name: [name] MRN: [id number] Date of Birth: 03/23/1951

## 2017-10-01 ENCOUNTER — Ambulatory Visit: Payer: Medicare Other

## 2017-10-01 DIAGNOSIS — M6281 Muscle weakness (generalized): Secondary | ICD-10-CM

## 2017-10-01 DIAGNOSIS — M546 Pain in thoracic spine: Secondary | ICD-10-CM

## 2017-10-01 DIAGNOSIS — G8929 Other chronic pain: Secondary | ICD-10-CM

## 2017-10-01 DIAGNOSIS — M545 Low back pain, unspecified: Secondary | ICD-10-CM

## 2017-10-01 NOTE — Therapy (Signed)
[facility] [address], [address] [place], [place], [address] Phone: [phone]   Fax:  [phone]  Physical Therapy Treatment  Patient Details  Name: [name] MRN: [id number] Date of Birth: 1951-04-21 Referring Provider: Shaune Pollack, MD   Encounter Date: 10/01/2017  PT End of Session - 10/01/17 1314    Visit Number  15    Date for PT Re-Evaluation  10/04/17    Authorization Type  Medicare    Authorization Time Period  KX at 15    PT Start Time  1230    PT Stop Time  1310    PT Time Calculation (min)  40 min    Activity Tolerance  Patient tolerated treatment well    Behavior During Therapy  [facility] for tasks assessed/performed       Past Medical History:  Diagnosis Date  . Abnormal Pap smear of cervix 07/2016   CIN1  . Constipation   . Depression   . Diverticulosis   . H. pylori infection   . History of blood transfusion 1984  . Hx of colonic polyps   . Hypercholesteremia   . Hyponatremia   . Migraines   . Vitamin D deficiency     Past Surgical History:  Procedure Laterality Date  . ANTERIOR AND POSTERIOR REPAIR N/A 04/24/2017   Procedure: ANTERIOR (CYSTOCELE) AND POSTERIOR (RECTOCELE)  REPAIR;  Surgeon: Patton Salles, MD;  [facility]: [facility];  Service: Gynecology;  Laterality: N/A;  . CESAREAN SECTION  1984  . COLONOSCOPY  2012  . COSMETIC SURGERY     Neck lift  . CYSTOSCOPY N/A 04/24/2017   Procedure: CYSTOSCOPY;  Surgeon: Patton Salles, MD;  [facility];  Service: Gynecology;  Laterality: N/A;  . HEMORRHOID SURGERY  1984  . LAPAROSCOPIC LYSIS OF ADHESIONS N/A 04/24/2017   Procedure: EXTENSIVE LAPAROSCOPIC LYSIS OF ADHESIONS;  Surgeon: Patton Salles, MD;  [facility];  Service: Gynecology;  Laterality: N/A;  . LAPAROSCOPIC VAGINAL HYSTERECTOMY WITH SALPINGO OOPHORECTOMY Bilateral 04/24/2017   Procedure: LAPAROSCOPIC ASSISTED VAGINAL HYSTERECTOMY WITH  SALPINGO OOPHORECTOMY and pelvic washings;  Surgeon: Patton Salles, MD;  [facility]: [facility];  Service: Gynecology;  Laterality: Bilateral;  3 hours  . RECTAL PROLAPSE REPAIR     Dr. Kendrick Ranch  . UMBILICAL HERNIA REPAIR     had surgery as a child.    There were no vitals filed for this visit.  Subjective Assessment - 10/01/17 1235    Subjective  My Lt hip is still hurting me.  I walked 2 miles yesterday    Patient Stated Goals  care for her twin grand children, improve endurance for walking, reduce LBP and thoracic pain, establish a regular weight training routine    Currently in Pain?  Yes    Pain Score  1  7/10 in Lt hip at times    Pain Location  Back    Pain Orientation  Left Lt hip    Pain Descriptors / Indicators  Sore    Pain Type  Chronic pain    Aggravating Factors   doing too much, activity    Pain Relieving Factors  rest, exercises                      [facility] Adult PT Treatment/Exercise - 10/01/17 0001      Lumbar Exercises: Stretches   Active Hamstring Stretch  3 reps;20 seconds  Double Knee to Chest Stretch  3 reps;20 seconds      Lumbar Exercises: Aerobic   UBE (Upper Arm Bike)  Level 0x 6 minutes (3/3) seated on green ball for core activation      Lumbar Exercises: Standing   Row  Strengthening;Both;20 reps;Power tower 20#    Shoulder Extension  Strengthening;Power Tower;Both;20 reps 20#      Lumbar Exercises: Supine   Bridge  20 reps red ball under legs      Knee/Hip Exercises: Stretches   Piriformis Stretch  Both;3 reps;20 seconds      Knee/Hip Exercises: Aerobic   Nustep  L2 x 10 min PT present to discuss pain      Knee/Hip Exercises: Standing   Hip Abduction  Stengthening;Both;2 sets;10 reps using balance poles    Hip Extension  Stengthening;Both;2 sets;10 reps using balance poles               PT Short Term Goals - 09/19/17 4098      PT SHORT TERM GOAL #3   Title  report < or = to 4/10 LBP at the end of the day     Baseline  this fluctuates- sometimes 0, sometimes 8/10    Status  On-going      PT SHORT TERM GOAL #4   Title  return to regular walking routine for exercise and verbalize understanding of safe progression    Baseline  walking 1 mile daily as able    Status  Achieved        PT Long Term Goals - 09/10/17 1202      PT LONG TERM GOAL #1   Title  be independent in advanced HEP    Time  8    Period  Weeks    Status  On-going      PT LONG TERM GOAL #2   Title  reduce FOTO to < or = to 38% limitation    Time  8    Period  Weeks    Status  On-going      PT LONG TERM GOAL #3   Title  improve strength and endurance to walk for exercise without significant fatigue    Baseline  fatigue     Time  8    Period  Weeks    Status  On-going            Plan - 10/01/17 1247    Clinical Impression Statement  Pt reports continued Lt hip pain with activity.  Pt has had overall less LBP and hip pain and is frustrated regarding Lt hip pain that is worse with walking and during the morning hours.  Pt requires verbal cues for posture and to relax her scapula.  Pt will be reassessed next session to determine need for further PT.  PT will suggest ionto to Lt hip.    Rehab Potential  Good    PT Frequency  2x / week    PT Duration  8 weeks    PT Treatment/Interventions  ADLs/Self Care Home Management;Cryotherapy;Electrical Stimulation;Ultrasound;Therapeutic activities;Therapeutic exercise;Patient/family education;Neuromuscular re-education;Manual techniques;Passive range of motion;Taping;Dry needling    PT Next Visit Plan  continue to build core  and postural strength.  Work on endurance.  ERO visit-add ionto for Lt hip    Consulted and Agree with Plan of Care  Patient       Patient will benefit from skilled therapeutic intervention in order to improve the following deficits and impairments:  Decreased activity tolerance, Decreased  endurance, Decreased strength, Increased muscle spasms, Pain,  Improper body mechanics, Impaired flexibility  Visit Diagnosis: Chronic bilateral low back pain without sciatica  Pain in thoracic spine  Muscle weakness (generalized)     Problem List Patient Active Problem List   Diagnosis Date Noted  . Status post laparoscopy-assisted vaginal hysterectomy 04/24/2017  . Dry eye syndrome 03/17/2016  . Headache 03/17/2016  . Hyperlipidemia 03/17/2016  . Lack of bladder control 03/17/2016  . Migraines 03/17/2016  . 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 MGD (meibomian gland disease) 02/27/2012    Lorrene ReidKelly Takacs, PT 10/01/17 1:17 PM  [facility] [address], [address] [place], [place], [address] Phone: [phone]   Fax:  [phone]  Name: [name] MRN: [id number] Date of Birth: 08/26/1950

## 2017-10-03 ENCOUNTER — Encounter: Payer: Self-pay | Admitting: Physical Therapy

## 2017-10-03 ENCOUNTER — Ambulatory Visit: Payer: Medicare Other | Admitting: Physical Therapy

## 2017-10-03 ENCOUNTER — Ambulatory Visit: Payer: BC Managed Care – PPO | Admitting: Obstetrics and Gynecology

## 2017-10-03 DIAGNOSIS — M6281 Muscle weakness (generalized): Secondary | ICD-10-CM

## 2017-10-03 DIAGNOSIS — M25552 Pain in left hip: Secondary | ICD-10-CM

## 2017-10-03 DIAGNOSIS — G8929 Other chronic pain: Secondary | ICD-10-CM

## 2017-10-03 DIAGNOSIS — M545 Low back pain, unspecified: Secondary | ICD-10-CM

## 2017-10-03 DIAGNOSIS — M546 Pain in thoracic spine: Secondary | ICD-10-CM

## 2017-10-03 NOTE — Addendum Note (Signed)
Addended by: Edrick OhAKACS, Baleria Wyman M on: 10/03/2017 01:54 PM   Modules accepted: Orders

## 2017-10-03 NOTE — Therapy (Addendum)
[facility] [address], [place], [place], [address] Phone: [phone]   Fax:  [phone]  Physical Therapy Treatment  Patient Details  Name: [name] MRN: [id number] Date of Birth: 1950/10/25 Referring Provider: Darcus Austin, MD   Encounter Date: 10/03/2017  PT End of Session - 10/03/17 1224    Visit Number  16    Date for PT Re-Evaluation  11/14/17    Authorization Type  Medicare    Authorization Time Period  KX at 59    PT Start Time  1224    PT Stop Time  1310    PT Time Calculation (min)  46 min    Activity Tolerance  Patient tolerated treatment well    Behavior During Therapy  [facility] for tasks assessed/performed       Past Medical History:  Diagnosis Date  . Abnormal Pap smear of cervix 07/2016   CIN1  . Constipation   . Depression   . Diverticulosis   . H. pylori infection   . History of blood transfusion 1984  . Hx of colonic polyps   . Hypercholesteremia   . Hyponatremia   . Migraines   . Vitamin D deficiency     Past Surgical History:  Procedure Laterality Date  . ANTERIOR AND POSTERIOR REPAIR N/A 04/24/2017   Procedure: ANTERIOR (CYSTOCELE) AND POSTERIOR (RECTOCELE)  REPAIR;  Surgeon: Nunzio Cobbs, MD;  Location: [facility];  Service: Gynecology;  Laterality: N/A;  . [place]  . COLONOSCOPY  2012  . COSMETIC SURGERY     Neck lift  . CYSTOSCOPY N/A 04/24/2017   Procedure: CYSTOSCOPY;  Surgeon: Nunzio Cobbs, MD;  Location: [facility];  Service: Gynecology;  Laterality: N/A;  . [place]  . LAPAROSCOPIC LYSIS OF ADHESIONS N/A 04/24/2017   Procedure: EXTENSIVE LAPAROSCOPIC LYSIS OF ADHESIONS;  Surgeon: Nunzio Cobbs, MD;  Location: [facility];  Service: Gynecology;  Laterality: N/A;  . LAPAROSCOPIC VAGINAL HYSTERECTOMY WITH SALPINGO OOPHORECTOMY Bilateral 04/24/2017   Procedure: LAPAROSCOPIC ASSISTED VAGINAL HYSTERECTOMY WITH  SALPINGO OOPHORECTOMY and pelvic washings;  Surgeon: Nunzio Cobbs, MD;  Location: [facility];  Service: Gynecology;  Laterality: Bilateral;  3 hours  . RECTAL PROLAPSE REPAIR     Dr. Lennie Hummer  . UMBILICAL HERNIA REPAIR     had surgery as a child.    There were no vitals filed for this visit.  Subjective Assessment - 10/03/17 1226    Subjective  My back is good this morning. Hip is achey this AM.     Pertinent History  hysterectomy 04/2017    Limitations  Standing;Walking;Lifting    How long can you walk comfortably?  exhaustion with walking     Patient Stated Goals  care for her twin grand children, improve endurance for walking, reduce LBP and thoracic pain, establish a regular weight training routine    Currently in Pain?  Yes    Pain Score  2     Pain Location  Hip    Pain Orientation  Left    Pain Descriptors / Indicators  Aching    Multiple Pain Sites  No         [facility] PT Assessment - 10/03/17 0001      Assessment   Medical Diagnosis  thoracolumbar back pain x 2-3 months      Prior Function   Level of Independence  Independent  Cognition   Overall Cognitive Status  Within Functional Limits for tasks assessed      Observation/Other Assessments   Focus on Therapeutic Outcomes (FOTO)   53% limitation                  [facility] Adult PT Treatment/Exercise - 10/03/17 0001      Lumbar Exercises: Aerobic   Stationary Bike  Nustep L 2 x 8 min      Lumbar Exercises: Standing   Row  Strengthening;Both;20 reps;Power tower 20#    Shoulder Extension  Strengthening;Power Tower;Both;20 reps 15# one arm at a time, TC/VC to faciliTATE correct muscle      Lumbar Exercises: Supine   Bridge  -- 2x10 VC to concentrate LT glute squeeze      Knee/Hip Exercises: Standing   Hip Abduction  Stengthening;Both;2 sets;10 reps;Knee straight    Abduction Limitations  1# added to ankles    Hip Extension  Stengthening;Both;2 sets;10 reps;Knee straight    Extension  Limitations  1# added    Other Standing Knee Exercises  Donkey kicks LT 2x6, TC at gluteals      Knee/Hip Exercises: Sidelying   Clams  Red band 2x 10             PT Education - 10/03/17 1305    Education provided  Yes    Education Details  Standing donkey kicks for [employer]) Educated  Patient    Methods  Explanation;Demonstration;Tactile cues;Verbal cues;Handout    Comprehension  Verbalized understanding;Returned demonstration       PT Short Term Goals - 10/03/17 1300      PT SHORT TERM GOAL #2   Title  verbalize and demonstrate body mechanics modifications for lumbar protection with housework and care of grandchildren    Time  4    Period  Weeks    Status  Achieved      PT SHORT TERM GOAL #3   Title  report < or = to 4/10 LBP at the end of the day    Baseline  this fluctuates- sometimes increased to 4-6/10 with fatigue    Time  4    Period  Weeks    Status  On-going Most often this is goal is met, there are some days when she feels tired that her LBP can be >4/10 but htis is infrequent      PT SHORT TERM GOAL #4   Title  return to regular walking routine for exercise and verbalize understanding of safe progression    Baseline  walking 1 mile daily as able    Time  4    Period  Weeks    Status  Achieved        PT Long Term Goals - 10/03/17 1301      PT LONG TERM GOAL #1   Title  be independent in advanced HEP    Time  6    Period  Weeks    Status  On-going    Target Date  11/14/17      PT LONG TERM GOAL #2   Title  reduce FOTO to < or = to 38% limitation    Time  6    Period  Weeks    Status  On-going    Target Date  11/14/17      PT LONG TERM GOAL #3   Title  improve strength and endurance to walk for exercise without significant fatigue    Baseline  fatigue     Period  --    Status  Achieved mild fatigue      PT LONG TERM GOAL #4   Title  report < or = to 3/10 LBP at the end of the day    Baseline  4/10     Time  8    Period  Weeks     Status  On-going      PT LONG TERM GOAL #5   Title  verbalize safe progression of core strength and weight training exercise program and perform without increased pain    Time  6    Period  Weeks    Status  On-going Still on going    Target Date  11/14/17            Plan - 10/03/17 1224    Clinical Impression Statement  Pt reports her low back is significantly better. Her main complaint is Lt hip pain/ache.  She demonstrates poor awareness and facilitation thoughout her Lt gluteal during her exercises. She was given standing donkey kicks to work on this at home. Pt reports 70% overall improvement and reports that her LBP and Lt gluteal pain increases to 4-6/10 intermittently with fatigue.  Pt will continue to benefit from skilled PT fo address endurance, strength and pain to allow for return to regular exercise and performance of daily activities with less fatigue or pain.      Rehab Potential  Good    PT Frequency  2x / week    PT Duration  6 weeks    PT Treatment/Interventions  ADLs/Self Care Home Management;Cryotherapy;Electrical Stimulation;Ultrasound;Therapeutic activities;Therapeutic exercise;Patient/family education;Neuromuscular re-education;Manual techniques;Passive range of motion;Taping;Dry needling;Iontophoresis '4mg'$ /ml Dexamethasone    PT Next Visit Plan  continue to build core  and postural strength.  Work on endurance. Ionto if MD signs order.      Recommended Other Services  initial certification is signed, recert sent 2/35/57    Consulted and Agree with Plan of Care  Patient       Patient will benefit from skilled therapeutic intervention in order to improve the following deficits and impairments:  Decreased activity tolerance, Decreased endurance, Decreased strength, Increased muscle spasms, Pain, Improper body mechanics, Impaired flexibility  Visit Diagnosis: Chronic bilateral low back pain without sciatica - Plan: PT plan of care cert/re-cert  Pain in thoracic  spine - Plan: PT plan of care cert/re-cert  Muscle weakness (generalized) - Plan: PT plan of care cert/re-cert  Pain in left hip - Plan: PT plan of care cert/re-cert     Problem List Patient Active Problem List   Diagnosis Date Noted  . Status post laparoscopy-assisted vaginal hysterectomy 04/24/2017  . Dry eye syndrome 03/17/2016  . Headache 03/17/2016  . Hyperlipidemia 03/17/2016  . Lack of bladder control 03/17/2016  . Migraines 03/17/2016  . 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 MGD (meibomian gland disease) 02/27/2012  Myrene Galas, PTA 10/03/17 1:53 PM Sigurd Sos, PT 10/03/17 1:53 PM    [facility] [address] [place], [place], [address] Phone: [phone]   Fax:  [phone]  Name: [name] MRN: [id number] Date of Birth: 1951-01-29

## 2017-10-03 NOTE — Patient Instructions (Signed)
Donkey Kicks  Stand at [employer] with your forearms resting on top of the counter. NO SLOUCHING!! Keep you heart lifted so you feel your back muscles activated. Balance on the right leg as you bend your left knee. Keeping your knee bent, knee comes forward thigh bone goes back. This action should come from the buttocks. Do 3-5x SLOWLY. Goal is to feel the buttocks muscle better. Do a few times thoughout the day.

## 2017-10-08 ENCOUNTER — Encounter: Payer: Self-pay | Admitting: Physical Therapy

## 2017-10-08 ENCOUNTER — Ambulatory Visit: Payer: Medicare Other | Attending: Family Medicine | Admitting: Physical Therapy

## 2017-10-08 DIAGNOSIS — M545 Low back pain, unspecified: Secondary | ICD-10-CM

## 2017-10-08 DIAGNOSIS — M6281 Muscle weakness (generalized): Secondary | ICD-10-CM | POA: Insufficient documentation

## 2017-10-08 DIAGNOSIS — M546 Pain in thoracic spine: Secondary | ICD-10-CM | POA: Insufficient documentation

## 2017-10-08 DIAGNOSIS — G8929 Other chronic pain: Secondary | ICD-10-CM | POA: Insufficient documentation

## 2017-10-08 DIAGNOSIS — M25552 Pain in left hip: Secondary | ICD-10-CM | POA: Insufficient documentation

## 2017-10-08 NOTE — Therapy (Signed)
[facility] [address], [place], [place], [address] Phone: [phone]   Fax:  [phone]  Physical Therapy Treatment  Patient Details  Name: [name] MRN: [id number] Date of Birth: 08-12-1950 Referring Provider: Darcus Austin, MD   Encounter Date: 10/08/2017  PT End of Session - 10/08/17 1227    Visit Number  17    Date for PT Re-Evaluation  11/14/17    Authorization Type  Medicare    Authorization Time Period  KX at 17    PT Start Time  1225    PT Stop Time  1305    PT Time Calculation (min)  40 min    Activity Tolerance  Patient tolerated treatment well    Behavior During Therapy  [facility] for tasks assessed/performed       Past Medical History:  Diagnosis Date  . Abnormal Pap smear of cervix 07/2016   CIN1  . Constipation   . Depression   . Diverticulosis   . H. pylori infection   . History of blood transfusion 1984  . Hx of colonic polyps   . Hypercholesteremia   . Hyponatremia   . Migraines   . Vitamin D deficiency     Past Surgical History:  Procedure Laterality Date  . ANTERIOR AND POSTERIOR REPAIR N/A 04/24/2017   Procedure: ANTERIOR (CYSTOCELE) AND POSTERIOR (RECTOCELE)  REPAIR;  Surgeon: Nunzio Cobbs, MD;  Location: [facility];  Service: Gynecology;  Laterality: N/A;  . [place]  . COLONOSCOPY  2012  . COSMETIC SURGERY     Neck lift  . CYSTOSCOPY N/A 04/24/2017   Procedure: CYSTOSCOPY;  Surgeon: Nunzio Cobbs, MD;  Location: [facility];  Service: Gynecology;  Laterality: N/A;  . [place]  . LAPAROSCOPIC LYSIS OF ADHESIONS N/A 04/24/2017   Procedure: EXTENSIVE LAPAROSCOPIC LYSIS OF ADHESIONS;  Surgeon: Nunzio Cobbs, MD;  Location: [facility];  Service: Gynecology;  Laterality: N/A;  . LAPAROSCOPIC VAGINAL HYSTERECTOMY WITH SALPINGO OOPHORECTOMY Bilateral 04/24/2017   Procedure: LAPAROSCOPIC ASSISTED VAGINAL HYSTERECTOMY WITH  SALPINGO OOPHORECTOMY and pelvic washings;  Surgeon: Nunzio Cobbs, MD;  Location: [facility];  Service: Gynecology;  Laterality: Bilateral;  3 hours  . RECTAL PROLAPSE REPAIR     Dr. Lennie Hummer  . UMBILICAL HERNIA REPAIR     had surgery as a child.    There were no vitals filed for this visit.  Subjective Assessment - 10/08/17 1228    Subjective  I did a lot this weekend and my back did ache after doing all my activities.     Pertinent History  hysterectomy 04/2017    Limitations  Standing;Walking;Lifting    How long can you walk comfortably?  exhaustion with walking     Patient Stated Goals  care for her twin grand children, improve endurance for walking, reduce LBP and thoracic pain, establish a regular weight training routine    Currently in Pain?  -- My low back is sore and achey this AM.     Pain Score  2     Aggravating Factors   over doing it    Pain Relieving Factors  stretching, exercises    Multiple Pain Sites  No                      [facility] Adult PT Treatment/Exercise - 10/08/17 0001      Lumbar Exercises: Aerobic  Stationary Bike  Nustep L3 x 10 min      Lumbar Exercises: Quadruped   Straight Leg Raise  -- 3x bil, VC/TC for form    Opposite Arm/Leg Raise  -- 2x bil TC for positioning/form      Knee/Hip Exercises: Stretches   Piriformis Stretch  Both;3 reps;20 seconds    Other Knee/Hip Stretches  Knee to chest bil 3x 20 sec      Knee/Hip Exercises: Standing   SLS  Rt 4x 10 sec VC for gluteal activation      Iontophoresis   Type of Iontophoresis  Dexamethasone #1    Location  Lt hip    Dose  1 ml    Time  6 hr wear  skin intact               PT Short Term Goals - 10/03/17 1300      PT SHORT TERM GOAL #2   Title  verbalize and demonstrate body mechanics modifications for lumbar protection with housework and care of grandchildren    Time  4    Period  Weeks    Status  Achieved      PT SHORT TERM GOAL #3   Title  report < or  = to 4/10 LBP at the end of the day    Baseline  this fluctuates- sometimes increased to 4-6/10 with fatigue    Time  4    Period  Weeks    Status  On-going Most often this is goal is met, there are some days when she feels tired that her LBP can be >4/10 but htis is infrequent      PT SHORT TERM GOAL #4   Title  return to regular walking routine for exercise and verbalize understanding of safe progression    Baseline  walking 1 mile daily as able    Time  4    Period  Weeks    Status  Achieved        PT Long Term Goals - 10/03/17 1301      PT LONG TERM GOAL #1   Title  be independent in advanced HEP    Time  6    Period  Weeks    Status  On-going    Target Date  11/14/17      PT LONG TERM GOAL #2   Title  reduce FOTO to < or = to 38% limitation    Time  6    Period  Weeks    Status  On-going    Target Date  11/14/17      PT LONG TERM GOAL #3   Title  improve strength and endurance to walk for exercise without significant fatigue    Baseline  fatigue     Period  --    Status  Achieved mild fatigue      PT LONG TERM GOAL #4   Title  report < or = to 3/10 LBP at the end of the day    Baseline  4/10     Time  8    Period  Weeks    Status  On-going      PT LONG TERM GOAL #5   Title  verbalize safe progression of core strength and weight training exercise program and perform without increased pain    Time  6    Period  Weeks    Status  On-going Still on going    Target Date  11/14/17  Plan - 10/08/17 1227    Clinical Impression Statement  Pt reports she "feels" her Lt buttocks pretty good now, although they remain pretty sore ( in a good  way). Pt fatigues very quickly when isolating her Lt gluteals.  No pain during treatment. Started ionto treatments today for Lt hip pain.     Rehab Potential  Good    PT Frequency  2x / week    PT Duration  6 weeks    PT Treatment/Interventions  ADLs/Self Care Home Management;Cryotherapy;Electrical  Stimulation;Ultrasound;Therapeutic activities;Therapeutic exercise;Patient/family education;Neuromuscular re-education;Manual techniques;Passive range of motion;Taping;Dry needling;Iontophoresis 62m/ml Dexamethasone    PT Next Visit Plan  continue to build core  and postural strength.  Work on endurance. Ionto #2.    Consulted and Agree with Plan of Care  Patient       Patient will benefit from skilled therapeutic intervention in order to improve the following deficits and impairments:  Decreased activity tolerance, Decreased endurance, Decreased strength, Increased muscle spasms, Pain, Improper body mechanics, Impaired flexibility  Visit Diagnosis: Chronic bilateral low back pain without sciatica  Pain in thoracic spine  Muscle weakness (generalized)  Pain in left hip     Problem List Patient Active Problem List   Diagnosis Date Noted  . Status post laparoscopy-assisted vaginal hysterectomy 04/24/2017  . Dry eye syndrome 03/17/2016  . Headache 03/17/2016  . Hyperlipidemia 03/17/2016  . Lack of bladder control 03/17/2016  . Migraines 03/17/2016  . 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 MGD (meibomian gland disease) 02/27/2012    Pace Lamadrid, PTA 10/08/2017, 1:05 PM  [facility] [address] [place] [address]Phone: [phone]  Fax:  [phone] Name: CAKEYLAH HENDELMRN: [id number] of Birth: 3March 10, 1952

## 2017-10-10 ENCOUNTER — Ambulatory Visit: Payer: Medicare Other | Admitting: Physical Therapy

## 2017-10-10 ENCOUNTER — Encounter: Payer: Self-pay | Admitting: Physical Therapy

## 2017-10-10 DIAGNOSIS — M545 Low back pain, unspecified: Secondary | ICD-10-CM

## 2017-10-10 DIAGNOSIS — M6281 Muscle weakness (generalized): Secondary | ICD-10-CM

## 2017-10-10 DIAGNOSIS — M25552 Pain in left hip: Secondary | ICD-10-CM

## 2017-10-10 DIAGNOSIS — G8929 Other chronic pain: Secondary | ICD-10-CM

## 2017-10-10 DIAGNOSIS — M546 Pain in thoracic spine: Secondary | ICD-10-CM

## 2017-10-10 NOTE — Therapy (Signed)
[facility] [address] [place], [place], [address] Phone: [phone]   Fax:  [phone]  Physical Therapy Treatment  Patient Details  Name: [name] MRN: [id number] Date of Birth: 11/18/1950 Referring Provider: Darcus Austin, MD   Encounter Date: 10/10/2017  PT End of Session - 10/10/17 1232    Visit Number  18    Date for PT Re-Evaluation  11/14/17    Authorization Type  Medicare    Authorization Time Period  KX now    PT Start Time  1230    PT Stop Time  1301    PT Time Calculation (min)  31 min    Activity Tolerance  Patient tolerated treatment well    Behavior During Therapy  [facility] for tasks assessed/performed       Past Medical History:  Diagnosis Date  . Abnormal Pap smear of cervix 07/2016   CIN1  . Constipation   . Depression   . Diverticulosis   . H. pylori infection   . History of blood transfusion 1984  . Hx of colonic polyps   . Hypercholesteremia   . Hyponatremia   . Migraines   . Vitamin D deficiency     Past Surgical History:  Procedure Laterality Date  . ANTERIOR AND POSTERIOR REPAIR N/A 04/24/2017   Procedure: ANTERIOR (CYSTOCELE) AND POSTERIOR (RECTOCELE)  REPAIR;  Surgeon: Nunzio Cobbs, MD;  Location: [facility];  Service: Gynecology;  Laterality: N/A;  . [place]  . COLONOSCOPY  2012  . COSMETIC SURGERY     Neck lift  . CYSTOSCOPY N/A 04/24/2017   Procedure: CYSTOSCOPY;  Surgeon: Nunzio Cobbs, MD;  Location: [facility];  Service: Gynecology;  Laterality: N/A;  . [place]  . LAPAROSCOPIC LYSIS OF ADHESIONS N/A 04/24/2017   Procedure: EXTENSIVE LAPAROSCOPIC LYSIS OF ADHESIONS;  Surgeon: Nunzio Cobbs, MD;  Location: [facility];  Service: Gynecology;  Laterality: N/A;  . LAPAROSCOPIC VAGINAL HYSTERECTOMY WITH SALPINGO OOPHORECTOMY Bilateral 04/24/2017   Procedure: LAPAROSCOPIC ASSISTED VAGINAL HYSTERECTOMY WITH SALPINGO  OOPHORECTOMY and pelvic washings;  Surgeon: Nunzio Cobbs, MD;  Location: [facility];  Service: Gynecology;  Laterality: Bilateral;  3 hours  . RECTAL PROLAPSE REPAIR     Dr. Lennie Hummer  . UMBILICAL HERNIA REPAIR     had surgery as a child.    There were no vitals filed for this visit.  Subjective Assessment - 10/10/17 1233    Subjective  The patch did great! I have an UTI.     Pertinent History  hysterectomy 04/2017    Limitations  Standing;Walking;Lifting    Currently in Pain?  Yes    Pain Location  Hip    Pain Orientation  Left    Pain Descriptors / Indicators  Sore    Multiple Pain Sites  No                      [facility] Adult PT Treatment/Exercise - 10/10/17 0001      Lumbar Exercises: Aerobic   Stationary Bike  L1 x 6 min PTA present for status/update    Elliptical  R1 L3 x 3 min 3 min was pretty challenging, pt kept good pace      Lumbar Exercises: Quadruped   Opposite Arm/Leg Raise  -- 3x VC/TC for form'techinique    Other Quadruped Lumbar Exercises  LT knee bent hip extensions 2x6  TC to help support position      Knee/Hip Exercises: Stretches   Other Knee/Hip Stretches  Knee to chest bil 3x 20 sec    Other Knee/Hip Stretches  Knee cross over stretch 3x 20 sec Lt only      Knee/Hip Exercises: Standing   Other Standing Knee Exercises  Donkey kicks 2x10 at [employer] VC for core activation      Iontophoresis   Type of Iontophoresis  Dexamethasone #2    Location  Lt hip    Dose  1 ml    Time  6 hr wear  Skin intact               PT Short Term Goals - 10/03/17 1300      PT SHORT TERM GOAL #2   Title  verbalize and demonstrate body mechanics modifications for lumbar protection with housework and care of grandchildren    Time  4    Period  Weeks    Status  Achieved      PT SHORT TERM GOAL #3   Title  report < or = to 4/10 LBP at the end of the day    Baseline  this fluctuates- sometimes increased to 4-6/10 with fatigue    Time   4    Period  Weeks    Status  On-going Most often this is goal is met, there are some days when she feels tired that her LBP can be >4/10 but htis is infrequent      PT SHORT TERM GOAL #4   Title  return to regular walking routine for exercise and verbalize understanding of safe progression    Baseline  walking 1 mile daily as able    Time  4    Period  Weeks    Status  Achieved        PT Long Term Goals - 10/03/17 1301      PT LONG TERM GOAL #1   Title  be independent in advanced HEP    Time  6    Period  Weeks    Status  On-going    Target Date  11/14/17      PT LONG TERM GOAL #2   Title  reduce FOTO to < or = to 38% limitation    Time  6    Period  Weeks    Status  On-going    Target Date  11/14/17      PT LONG TERM GOAL #3   Title  improve strength and endurance to walk for exercise without significant fatigue    Baseline  fatigue     Period  --    Status  Achieved mild fatigue      PT LONG TERM GOAL #4   Title  report < or = to 3/10 LBP at the end of the day    Baseline  4/10     Time  8    Period  Weeks    Status  On-going      PT LONG TERM GOAL #5   Title  verbalize safe progression of core strength and weight training exercise program and perform without increased pain    Time  6    Period  Weeks    Status  On-going Still on going    Target Date  11/14/17            Plan - 10/10/17 1233    Clinical Impression Statement  Pt felt  significant hip pain relief with the first ionto patch. #2 was applied today. Pt continues to have significant weakenss of the LT gluteal. she fatigues very quickly and does not tolerate a lot of repetitions BUt this is becasue she is able to recruit them properly and not substitute. Pt unsteady with her quadruped exercises but they are still new and i anticipte with time she will improve and be able to do more reps with better technique as she gets stronger.     Rehab Potential  Good    PT Duration  6 weeks    PT  Treatment/Interventions  ADLs/Self Care Home Management;Cryotherapy;Electrical Stimulation;Ultrasound;Therapeutic activities;Therapeutic exercise;Patient/family education;Neuromuscular re-education;Manual techniques;Passive range of motion;Taping;Dry needling;Iontophoresis 44m/ml Dexamethasone    PT Next Visit Plan  continue to build core, LT buttock and postural strength.  Work on endurance by increasing her reps withher hip strengthening exercises. Ionto #3 next,     Consulted and Agree with Plan of Care  Patient       Patient will benefit from skilled therapeutic intervention in order to improve the following deficits and impairments:  Decreased activity tolerance, Decreased endurance, Decreased strength, Increased muscle spasms, Pain, Improper body mechanics, Impaired flexibility  Visit Diagnosis: Chronic bilateral low back pain without sciatica  Pain in thoracic spine  Muscle weakness (generalized)  Pain in left hip     Problem List Patient Active Problem List   Diagnosis Date Noted  . Status post laparoscopy-assisted vaginal hysterectomy 04/24/2017  . Dry eye syndrome 03/17/2016  . Headache 03/17/2016  . Hyperlipidemia 03/17/2016  . Lack of bladder control 03/17/2016  . Migraines 03/17/2016  . 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 MGD (meibomian gland disease) 02/27/2012    COCHRAN,JENNIFER, PTA 10/10/2017, 1:07 PM  [facility] [address] [place] [address]Phone: [phone]  Fax:  [phone] Name: Whitney CHRISTOPHMRN: [id number] of Birth: 3Sep 11, 1952

## 2017-10-15 ENCOUNTER — Ambulatory Visit: Payer: Medicare Other | Admitting: Physical Therapy

## 2017-10-15 ENCOUNTER — Encounter: Payer: Self-pay | Admitting: Physical Therapy

## 2017-10-15 DIAGNOSIS — M6281 Muscle weakness (generalized): Secondary | ICD-10-CM

## 2017-10-15 DIAGNOSIS — M546 Pain in thoracic spine: Secondary | ICD-10-CM

## 2017-10-15 DIAGNOSIS — M25552 Pain in left hip: Secondary | ICD-10-CM

## 2017-10-15 DIAGNOSIS — G8929 Other chronic pain: Secondary | ICD-10-CM

## 2017-10-15 DIAGNOSIS — M545 Low back pain, unspecified: Secondary | ICD-10-CM

## 2017-10-15 NOTE — Therapy (Signed)
[facility] [address] [place], [place], [address] Phone: [phone]   Fax:  [phone]  Physical Therapy Treatment  Patient Details  Name: [name] MRN: [id number] Date of Birth: 1950/11/15 Referring Provider: Darcus Austin, MD   Encounter Date: 10/15/2017  PT End of Session - 10/15/17 1229    Visit Number  19    Date for PT Re-Evaluation  11/14/17    Authorization Type  Medicare    Authorization Time Period  KX now    PT Start Time  1220    PT Stop Time  1259    PT Time Calculation (min)  39 min    Activity Tolerance  Patient tolerated treatment well    Behavior During Therapy  [facility] for tasks assessed/performed       Past Medical History:  Diagnosis Date  . Abnormal Pap smear of cervix 07/2016   CIN1  . Constipation   . Depression   . Diverticulosis   . H. pylori infection   . History of blood transfusion 1984  . Hx of colonic polyps   . Hypercholesteremia   . Hyponatremia   . Migraines   . Vitamin D deficiency     Past Surgical History:  Procedure Laterality Date  . ANTERIOR AND POSTERIOR REPAIR N/A 04/24/2017   Procedure: ANTERIOR (CYSTOCELE) AND POSTERIOR (RECTOCELE)  REPAIR;  Surgeon: Nunzio Cobbs, MD;  Location: [facility];  Service: Gynecology;  Laterality: N/A;  . [place]  . COLONOSCOPY  2012  . COSMETIC SURGERY     Neck lift  . CYSTOSCOPY N/A 04/24/2017   Procedure: CYSTOSCOPY;  Surgeon: Nunzio Cobbs, MD;  Location: [facility];  Service: Gynecology;  Laterality: N/A;  . [place]  . LAPAROSCOPIC LYSIS OF ADHESIONS N/A 04/24/2017   Procedure: EXTENSIVE LAPAROSCOPIC LYSIS OF ADHESIONS;  Surgeon: Nunzio Cobbs, MD;  Location: [facility];  Service: Gynecology;  Laterality: N/A;  . LAPAROSCOPIC VAGINAL HYSTERECTOMY WITH SALPINGO OOPHORECTOMY Bilateral 04/24/2017   Procedure: LAPAROSCOPIC ASSISTED VAGINAL HYSTERECTOMY WITH  SALPINGO OOPHORECTOMY and pelvic washings;  Surgeon: Nunzio Cobbs, MD;  Location: [facility];  Service: Gynecology;  Laterality: Bilateral;  3 hours  . RECTAL PROLAPSE REPAIR     Dr. Lennie Hummer  . UMBILICAL HERNIA REPAIR     had surgery as a child.    There were no vitals filed for this visit.  Subjective Assessment - 10/15/17 1234    Subjective  Pacth came off bc I was taking my pants on & off frequently bc of maybe some urge incontinence.     Pertinent History  hysterectomy 04/2017    Limitations  Standing;Walking;Lifting    How long can you walk comfortably?  exhaustion with walking     Patient Stated Goals  care for her twin grand children, improve endurance for walking, reduce LBP and thoracic pain, establish a regular weight training routine    Currently in Pain?  No/denies    Multiple Pain Sites  No                      [facility] Adult PT Treatment/Exercise - 10/15/17 0001      Lumbar Exercises: Aerobic   Stationary Bike  L1 x 6 min PTA present for status/update    Elliptical  R2L3 x 3 min 3 min was pretty challenging, pt kept good pace  Lumbar Exercises: Quadruped   Opposite Arm/Leg Raise  -- 3x VC/TC for form'techinique: pt shaking at 3 reps    Other Quadruped Lumbar Exercises  LT knee bent hip extensions 2x6 Bil TC for Lt  buttocks TC to help support position      Knee/Hip Exercises: Standing   Forward Step Up  Left;2 sets;10 reps;Hand Hold: 2;Step Height: 6" Light UE on rails, VC to squeeze glutes      Iontophoresis   Type of Iontophoresis  Dexamethasone #3    Location  Lt hip    Dose  1 ml    Time  6 hr wear  Skin intact               PT Short Term Goals - 10/03/17 1300      PT SHORT TERM GOAL #2   Title  verbalize and demonstrate body mechanics modifications for lumbar protection with housework and care of grandchildren    Time  4    Period  Weeks    Status  Achieved      PT SHORT TERM GOAL #3   Title  report < or = to 4/10  LBP at the end of the day    Baseline  this fluctuates- sometimes increased to 4-6/10 with fatigue    Time  4    Period  Weeks    Status  On-going Most often this is goal is met, there are some days when she feels tired that her LBP can be >4/10 but htis is infrequent      PT SHORT TERM GOAL #4   Title  return to regular walking routine for exercise and verbalize understanding of safe progression    Baseline  walking 1 mile daily as able    Time  4    Period  Weeks    Status  Achieved        PT Long Term Goals - 10/03/17 1301      PT LONG TERM GOAL #1   Title  be independent in advanced HEP    Time  6    Period  Weeks    Status  On-going    Target Date  11/14/17      PT LONG TERM GOAL #2   Title  reduce FOTO to < or = to 38% limitation    Time  6    Period  Weeks    Status  On-going    Target Date  11/14/17      PT LONG TERM GOAL #3   Title  improve strength and endurance to walk for exercise without significant fatigue    Baseline  fatigue     Period  --    Status  Achieved mild fatigue      PT LONG TERM GOAL #4   Title  report < or = to 3/10 LBP at the end of the day    Baseline  4/10     Time  8    Period  Weeks    Status  On-going      PT LONG TERM GOAL #5   Title  verbalize safe progression of core strength and weight training exercise program and perform without increased pain    Time  6    Period  Weeks    Status  On-going Still on going    Target Date  11/14/17            Plan - 10/15/17 1229  Clinical Impression Statement  Pt showing improved ability to facilitate her glutes during her exercises today, especially in the quadruped position. Pt describes a feeling of urgency when she has to urinate. She was educated in our pelvic floor/incontinence program thatshe may want to look into. She agreed. Pt was not able to do more reps today as previously planned as she is just that weak in her gluteals.  She conitnues to tolerate the ionto patch very  well. She reports hip pain is less often.     Rehab Potential  Good    PT Frequency  2x / week    PT Duration  6 weeks    PT Treatment/Interventions  ADLs/Self Care Home Management;Cryotherapy;Electrical Stimulation;Ultrasound;Therapeutic activities;Therapeutic exercise;Patient/family education;Neuromuscular re-education;Manual techniques;Passive range of motion;Taping;Dry needling;Iontophoresis '4mg'$ /ml Dexamethasone    PT Next Visit Plan  continue to build core, LT buttock and postural strength.  Work on endurance by increasing her reps withher hip strengthening exercises. Ionto #4 next,     Consulted and Agree with Plan of Care  Patient       Patient will benefit from skilled therapeutic intervention in order to improve the following deficits and impairments:  Decreased activity tolerance, Decreased endurance, Decreased strength, Increased muscle spasms, Pain, Improper body mechanics, Impaired flexibility  Visit Diagnosis: Chronic bilateral low back pain without sciatica  Pain in thoracic spine  Muscle weakness (generalized)  Pain in left hip     Problem List Patient Active Problem List   Diagnosis Date Noted  . Status post laparoscopy-assisted vaginal hysterectomy 04/24/2017  . Dry eye syndrome 03/17/2016  . Headache 03/17/2016  . Hyperlipidemia 03/17/2016  . Lack of bladder control 03/17/2016  . Migraines 03/17/2016  . 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 MGD (meibomian gland disease) 02/27/2012    Kamdon Reisig, PTA 10/15/2017, 1:03 PM  [facility] [address] [place], [place], [address] Phone: [phone]   Fax:  [phone]  Name: MALLERIE BLOK MRN: [id number] Date of Birth: 1951-04-15

## 2017-10-17 ENCOUNTER — Encounter: Payer: Self-pay | Admitting: Physical Therapy

## 2017-10-17 ENCOUNTER — Ambulatory Visit: Payer: Medicare Other | Admitting: Physical Therapy

## 2017-10-17 DIAGNOSIS — M545 Low back pain, unspecified: Secondary | ICD-10-CM

## 2017-10-17 DIAGNOSIS — G8929 Other chronic pain: Secondary | ICD-10-CM

## 2017-10-17 DIAGNOSIS — M25552 Pain in left hip: Secondary | ICD-10-CM

## 2017-10-17 DIAGNOSIS — M546 Pain in thoracic spine: Secondary | ICD-10-CM

## 2017-10-17 DIAGNOSIS — M6281 Muscle weakness (generalized): Secondary | ICD-10-CM

## 2017-10-17 NOTE — Therapy (Signed)
[facility] [address], [place], [place], [address] Phone: [phone]   Fax:  [phone]  Physical Therapy Treatment  Patient Details  Name: [name] MRN: [id number] Date of Birth: March 03, 1951 Referring Provider: Darcus Austin, MD   Encounter Date: 10/17/2017  PT End of Session - 10/17/17 1144    Visit Number  20    Date for PT Re-Evaluation  11/14/17    Authorization Type  Medicare    Authorization Time Period  KX now    PT Start Time  1143    PT Stop Time  1216    PT Time Calculation (min)  33 min    Activity Tolerance  Patient tolerated treatment well    Behavior During Therapy  [facility] for tasks assessed/performed       Past Medical History:  Diagnosis Date  . Abnormal Pap smear of cervix 07/2016   CIN1  . Constipation   . Depression   . Diverticulosis   . H. pylori infection   . History of blood transfusion 1984  . Hx of colonic polyps   . Hypercholesteremia   . Hyponatremia   . Migraines   . Vitamin D deficiency     Past Surgical History:  Procedure Laterality Date  . ANTERIOR AND POSTERIOR REPAIR N/A 04/24/2017   Procedure: ANTERIOR (CYSTOCELE) AND POSTERIOR (RECTOCELE)  REPAIR;  Surgeon: Nunzio Cobbs, MD;  Location: [facility];  Service: Gynecology;  Laterality: N/A;  . [place]  . COLONOSCOPY  2012  . COSMETIC SURGERY     Neck lift  . CYSTOSCOPY N/A 04/24/2017   Procedure: CYSTOSCOPY;  Surgeon: Nunzio Cobbs, MD;  Location: [facility];  Service: Gynecology;  Laterality: N/A;  . [place]  . LAPAROSCOPIC LYSIS OF ADHESIONS N/A 04/24/2017   Procedure: EXTENSIVE LAPAROSCOPIC LYSIS OF ADHESIONS;  Surgeon: Nunzio Cobbs, MD;  Location: [facility];  Service: Gynecology;  Laterality: N/A;  . LAPAROSCOPIC VAGINAL HYSTERECTOMY WITH SALPINGO OOPHORECTOMY Bilateral 04/24/2017   Procedure: LAPAROSCOPIC ASSISTED VAGINAL HYSTERECTOMY WITH  SALPINGO OOPHORECTOMY and pelvic washings;  Surgeon: Nunzio Cobbs, MD;  Location: [facility];  Service: Gynecology;  Laterality: Bilateral;  3 hours  . RECTAL PROLAPSE REPAIR     Dr. Lennie Hummer  . UMBILICAL HERNIA REPAIR     had surgery as a child.    There were no vitals filed for this visit.  Subjective Assessment - 10/17/17 1145    Subjective  Patch stayed on better last time. MY back and hip are getting better and better!    Pertinent History  hysterectomy 04/2017    Limitations  Standing;Walking;Lifting    How long can you walk comfortably?  exhaustion with walking     Patient Stated Goals  care for her twin grand children, improve endurance for walking, reduce LBP and thoracic pain, establish a regular weight training routine    Currently in Pain?  No/denies    Multiple Pain Sites  No                      [facility] Adult PT Treatment/Exercise - 10/17/17 0001      Lumbar Exercises: Aerobic   Stationary Bike  L2 x 7 min PTA present for status/update    Elliptical  R2 L3 x 4 min PTA present to monitor      Lumbar Exercises: Quadruped   Opposite Arm/Leg Raise  --  3x 2 VC/TC for form'techinique: pt shaking at 3 reps     Other Quadruped Lumbar Exercises  LT knee bent hip extensions 2x8 Bil TC for Lt  buttocks TC to help support position      Knee/Hip Exercises: Stretches   Active Hamstring Stretch  -- 3 position yoga series for hip/ big Toe stretch 20 sec each     Other Knee/Hip Stretches  Knee to chest bil 3x 20 sec    Other Knee/Hip Stretches  Knee cross over stretch 3x 20 sec Lt only      Iontophoresis   Type of Iontophoresis  Dexamethasone #4    Location  Lt hip    Dose  1 ml    Time  6 hr wear  Skin intact               PT Short Term Goals - 10/03/17 1300      PT SHORT TERM GOAL #2   Title  verbalize and demonstrate body mechanics modifications for lumbar protection with housework and care of grandchildren    Time  4    Period  Weeks     Status  Achieved      PT SHORT TERM GOAL #3   Title  report < or = to 4/10 LBP at the end of the day    Baseline  this fluctuates- sometimes increased to 4-6/10 with fatigue    Time  4    Period  Weeks    Status  On-going Most often this is goal is met, there are some days when she feels tired that her LBP can be >4/10 but htis is infrequent      PT SHORT TERM GOAL #4   Title  return to regular walking routine for exercise and verbalize understanding of safe progression    Baseline  walking 1 mile daily as able    Time  4    Period  Weeks    Status  Achieved        PT Long Term Goals - 10/03/17 1301      PT LONG TERM GOAL #1   Title  be independent in advanced HEP    Time  6    Period  Weeks    Status  On-going    Target Date  11/14/17      PT LONG TERM GOAL #2   Title  reduce FOTO to < or = to 38% limitation    Time  6    Period  Weeks    Status  On-going    Target Date  11/14/17      PT LONG TERM GOAL #3   Title  improve strength and endurance to walk for exercise without significant fatigue    Baseline  fatigue     Period  --    Status  Achieved mild fatigue      PT LONG TERM GOAL #4   Title  report < or = to 3/10 LBP at the end of the day    Baseline  4/10     Time  8    Period  Weeks    Status  On-going      PT LONG TERM GOAL #5   Title  verbalize safe progression of core strength and weight training exercise program and perform without increased pain    Time  6    Period  Weeks    Status  On-going Still on going    Target  Date  11/14/17            Plan - 10/17/17 1144    Clinical Impression Statement  Pt was completely exhausted after her last session. Today she was able to do SLIGHTLY more work but completes it in a very fastidious manor. Applied #4 ionto patch today as the patch continues to be quite helpful. Pt is planning on persuing an PT order for urge incontinence.     Rehab Potential  Good    PT Frequency  2x / week    PT Duration  6  weeks    PT Treatment/Interventions  ADLs/Self Care Home Management;Cryotherapy;Electrical Stimulation;Ultrasound;Therapeutic activities;Therapeutic exercise;Patient/family education;Neuromuscular re-education;Manual techniques;Passive range of motion;Taping;Dry needling;Iontophoresis 23m/ml Dexamethasone    PT Next Visit Plan  continue to build core, LT buttock and postural strength.  Work on endurance by increasing her reps withher hip strengthening exercises. Ionto #5 next,     Consulted and Agree with Plan of Care  --       Patient will benefit from skilled therapeutic intervention in order to improve the following deficits and impairments:  Decreased activity tolerance, Decreased endurance, Decreased strength, Increased muscle spasms, Pain, Improper body mechanics, Impaired flexibility  Visit Diagnosis: Chronic bilateral low back pain without sciatica  Pain in thoracic spine  Muscle weakness (generalized)  Pain in left hip     Problem List Patient Active Problem List   Diagnosis Date Noted  . Status post laparoscopy-assisted vaginal hysterectomy 04/24/2017  . Dry eye syndrome 03/17/2016  . Headache 03/17/2016  . Hyperlipidemia 03/17/2016  . Lack of bladder control 03/17/2016  . Migraines 03/17/2016  . 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 MGD (meibomian gland disease) 02/27/2012    COCHRAN,JENNIFER , PTA 10/17/2017, 12:21 PM  [facility] [address] [place] [address]Phone: [phone]  Fax:  [phone] Name: CLENISE JRMRN: [id number] of Birth: [birth year]

## 2017-10-22 ENCOUNTER — Ambulatory Visit: Payer: Medicare Other | Admitting: Physical Therapy

## 2017-10-22 ENCOUNTER — Encounter: Payer: Self-pay | Admitting: Physical Therapy

## 2017-10-22 DIAGNOSIS — M545 Low back pain, unspecified: Secondary | ICD-10-CM

## 2017-10-22 DIAGNOSIS — M546 Pain in thoracic spine: Secondary | ICD-10-CM

## 2017-10-22 DIAGNOSIS — G8929 Other chronic pain: Secondary | ICD-10-CM

## 2017-10-22 DIAGNOSIS — M6281 Muscle weakness (generalized): Secondary | ICD-10-CM

## 2017-10-22 DIAGNOSIS — M25552 Pain in left hip: Secondary | ICD-10-CM

## 2017-10-22 NOTE — Therapy (Signed)
[facility] [address], [address] [place], [place], [address] Phone: [phone]   Fax:  [phone]  Physical Therapy Treatment  Patient Details  Name: Whitney BellowsChristine H Slaven MRN: [id number] Date of Birth: 08/05/1951 Referring Provider: Shaune PollackGates, Donna, MD   Encounter Date: 10/22/2017  PT End of Session - 10/22/17 1147    Visit Number  21    Date for PT Re-Evaluation  11/14/17    Authorization Type  Medicare    Authorization Time Period  KX now    PT Start Time  1135    PT Stop Time  1213    PT Time Calculation (min)  38 min    Activity Tolerance  Patient tolerated treatment well    Behavior During Therapy  [facility] for tasks assessed/performed       Past Medical History:  Diagnosis Date  . Abnormal Pap smear of cervix 07/2016   CIN1  . Constipation   . Depression   . Diverticulosis   . H. pylori infection   . History of blood transfusion 1984  . Hx of colonic polyps   . Hypercholesteremia   . Hyponatremia   . Migraines   . Vitamin D deficiency     Past Surgical History:  Procedure Laterality Date  . ANTERIOR AND POSTERIOR REPAIR N/A 04/24/2017   Procedure: ANTERIOR (CYSTOCELE) AND POSTERIOR (RECTOCELE)  REPAIR;  Surgeon: Patton SallesAmundson C Silva, Brook E, MD;  Location: [facility];  Service: Gynecology;  Laterality: N/A;  . CESAREAN SECTION  1984  . COLONOSCOPY  2012  . COSMETIC SURGERY     Neck lift  . CYSTOSCOPY N/A 04/24/2017   Procedure: CYSTOSCOPY;  Surgeon: Patton SallesAmundson C Silva, Brook E, MD;  Location: [facility];  Service: Gynecology;  Laterality: N/A;  . HEMORRHOID SURGERY  1984  . LAPAROSCOPIC LYSIS OF ADHESIONS N/A 04/24/2017   Procedure: EXTENSIVE LAPAROSCOPIC LYSIS OF ADHESIONS;  Surgeon: Patton SallesAmundson C Silva, Brook E, MD;  Location: [facility];  Service: Gynecology;  Laterality: N/A;  . LAPAROSCOPIC VAGINAL HYSTERECTOMY WITH SALPINGO OOPHORECTOMY Bilateral 04/24/2017   Procedure: LAPAROSCOPIC ASSISTED VAGINAL HYSTERECTOMY WITH  SALPINGO OOPHORECTOMY and pelvic washings;  Surgeon: Patton SallesAmundson C Silva, Brook E, MD;  Location: [facility];  Service: Gynecology;  Laterality: Bilateral;  3 hours  . RECTAL PROLAPSE REPAIR     Dr. Kendrick Ranchim Davis  . UMBILICAL HERNIA REPAIR     had surgery as a child.    There were no vitals filed for this visit.  Subjective Assessment - 10/22/17 1149    Subjective  Some hip and bacl pain at night but over all still better    Pertinent History  hysterectomy 04/2017    Limitations  Standing;Walking;Lifting    Currently in Pain?  No/denies    Multiple Pain Sites  No                      [facility] Adult PT Treatment/Exercise - 10/22/17 0001      Lumbar Exercises: Aerobic   Stationary Bike  L2 x 10 min PTA present for status/update    Elliptical  R2 L3 x 6 min PTA present to monitor      Lumbar Exercises: Quadruped   Opposite Arm/Leg Raise  Right arm/Left leg;Left arm/Right leg;5 reps;2 seconds    Other Quadruped Lumbar Exercises  LT knee bent hip extensions 2x10 Bil TC for Lt  buttocks TC to help support position      Knee/Hip Exercises: Stretches   Other Knee/Hip  Stretches  Knee to chest bil 3x 20 sec    Other Knee/Hip Stretches  Knee cross over stretch 3x 20 sec Lt only      Iontophoresis   Type of Iontophoresis  Dexamethasone #5    Location  Lt hip    Dose  1 ml    Time  6 hr wear  Skin intact               PT Short Term Goals - 10/22/17 1201      PT SHORT TERM GOAL #3   Title  report < or = to 4/10 LBP at the end of the day    Time  4    Period  Weeks    Status  On-going 6/10        PT Long Term Goals - 10/03/17 1301      PT LONG TERM GOAL #1   Title  be independent in advanced HEP    Time  6    Period  Weeks    Status  On-going    Target Date  11/14/17      PT LONG TERM GOAL #2   Title  reduce FOTO to < or = to 38% limitation    Time  6    Period  Weeks    Status  On-going    Target Date  11/14/17      PT LONG TERM GOAL #3   Title  improve  strength and endurance to walk for exercise without significant fatigue    Baseline  fatigue     Period  --    Status  Achieved mild fatigue      PT LONG TERM GOAL #4   Title  report < or = to 3/10 LBP at the end of the day    Baseline  4/10     Time  8    Period  Weeks    Status  On-going      PT LONG TERM GOAL #5   Title  verbalize safe progression of core strength and weight training exercise program and perform without increased pain    Time  6    Period  Weeks    Status  On-going Still on going    Target Date  11/14/17            Plan - 10/22/17 1211    Clinical Impression Statement  Pt doing excellent facilitating her glutes, reports these exercises totally exhaust her. Pain is mainly an ache at night now but pt still rates this around a 6/10. Applied ionto #5 today.     Rehab Potential  Good    PT Frequency  2x / week    PT Duration  6 weeks    PT Treatment/Interventions  ADLs/Self Care Home Management;Cryotherapy;Electrical Stimulation;Ultrasound;Therapeutic activities;Therapeutic exercise;Patient/family education;Neuromuscular re-education;Manual techniques;Passive range of motion;Taping;Dry needling;Iontophoresis 4mg /ml Dexamethasone    PT Next Visit Plan  continue to build core, LT buttock and postural strength.  Work on endurance by increasing her reps withher hip strengthening exercises. Ionto #6  next,     Consulted and Agree with Plan of Care  Patient       Patient will benefit from skilled therapeutic intervention in order to improve the following deficits and impairments:  Decreased activity tolerance, Decreased endurance, Decreased strength, Increased muscle spasms, Pain, Improper body mechanics, Impaired flexibility  Visit Diagnosis: Chronic bilateral low back pain without sciatica  Pain in thoracic spine  Muscle weakness (generalized)  Pain in left hip     Problem List Patient Active Problem List   Diagnosis Date Noted  . Status post  laparoscopy-assisted vaginal hysterectomy 04/24/2017  . Dry eye syndrome 03/17/2016  . Headache 03/17/2016  . Hyperlipidemia 03/17/2016  . Lack of bladder control 03/17/2016  . Migraines 03/17/2016  . 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 MGD (meibomian gland disease) 02/27/2012    Angelique Chevalier, PTA 10/22/2017, 12:14 PM  [facility] [address] [place], [place], [address] Phone: [phone]   Fax:  [phone]  Name: RIMSHA TREMBLEY MRN: [id number] Date of Birth: Dec 18, 1950

## 2017-10-24 ENCOUNTER — Encounter: Payer: Self-pay | Admitting: Physical Therapy

## 2017-10-24 ENCOUNTER — Ambulatory Visit: Payer: Medicare Other | Admitting: Physical Therapy

## 2017-10-24 DIAGNOSIS — M546 Pain in thoracic spine: Secondary | ICD-10-CM

## 2017-10-24 DIAGNOSIS — M25552 Pain in left hip: Secondary | ICD-10-CM

## 2017-10-24 DIAGNOSIS — M545 Low back pain, unspecified: Secondary | ICD-10-CM

## 2017-10-24 DIAGNOSIS — M6281 Muscle weakness (generalized): Secondary | ICD-10-CM

## 2017-10-24 DIAGNOSIS — G8929 Other chronic pain: Secondary | ICD-10-CM

## 2017-10-24 NOTE — Therapy (Signed)
[facility] [address], [address] [place], [place], [address] Phone: [phone]   Fax:  [phone]  Physical Therapy Treatment  Patient Details  Name: Whitney BellowsChristine H Peterson MRN: [id number] Date of Birth: 01/20/1951 Referring Provider: Shaune PollackGates, Donna, MD   Encounter Date: 10/24/2017  PT End of Session - 10/24/17 1148    Visit Number  22    Date for PT Re-Evaluation  11/14/17    Authorization Type  Medicare    Authorization Time Period  KX now    PT Start Time  1147    PT Stop Time  1229    PT Time Calculation (min)  42 min    Activity Tolerance  Patient tolerated treatment well    Behavior During Therapy  [facility] for tasks assessed/performed       Past Medical History:  Diagnosis Date  . Abnormal Pap smear of cervix 07/2016   CIN1  . Constipation   . Depression   . Diverticulosis   . H. pylori infection   . History of blood transfusion 1984  . Hx of colonic polyps   . Hypercholesteremia   . Hyponatremia   . Migraines   . Vitamin D deficiency     Past Surgical History:  Procedure Laterality Date  . ANTERIOR AND POSTERIOR REPAIR N/A 04/24/2017   Procedure: ANTERIOR (CYSTOCELE) AND POSTERIOR (RECTOCELE)  REPAIR;  Surgeon: Patton SallesAmundson C Silva, Brook E, MD;  Location: [facility];  Service: Gynecology;  Laterality: N/A;  . CESAREAN SECTION  1984  . COLONOSCOPY  2012  . COSMETIC SURGERY     Neck lift  . CYSTOSCOPY N/A 04/24/2017   Procedure: CYSTOSCOPY;  Surgeon: Patton SallesAmundson C Silva, Brook E, MD;  Location: [facility];  Service: Gynecology;  Laterality: N/A;  . HEMORRHOID SURGERY  1984  . LAPAROSCOPIC LYSIS OF ADHESIONS N/A 04/24/2017   Procedure: EXTENSIVE LAPAROSCOPIC LYSIS OF ADHESIONS;  Surgeon: Patton SallesAmundson C Silva, Brook E, MD;  Location[facility];  Service: Gynecology;  Laterality: N/A;  . LAPAROSCOPIC VAGINAL HYSTERECTOMY WITH SALPINGO OOPHORECTOMY Bilateral 04/24/2017   Procedure: LAPAROSCOPIC ASSISTED VAGINAL HYSTERECTOMY WITH  SALPINGO OOPHORECTOMY and pelvic washings;  Surgeon: Patton SallesAmundson C Silva, Brook E, MD;  Location: [facility];  Service: Gynecology;  Laterality: Bilateral;  3 hours  . RECTAL PROLAPSE REPAIR     Dr. Kendrick Ranchim Davis  . UMBILICAL HERNIA REPAIR     had surgery as a child.    There were no vitals filed for this visit.  Subjective Assessment - 10/24/17 1150    Subjective  Pain not too bad this AM. Yesterday at the end of the day my back was killing me.     Pertinent History  hysterectomy 04/2017    Limitations  Standing;Walking;Lifting    Patient Stated Goals  care for her twin grand children, improve endurance for walking, reduce LBP and thoracic pain, establish a regular weight training routine    Currently in Pain?  No/denies    Multiple Pain Sites  No                      [facility] Adult PT Treatment/Exercise - 10/24/17 0001      Lumbar Exercises: Stretches   Quadruped Mid Back Stretch  -- childs pose stretch in between exercises      Lumbar Exercises: Aerobic   Stationary Bike  L2 x 10 min PTA present for status/update    Elliptical  R2 L3 x 6 min PTA present to monitor  Lumbar Exercises: Quadruped   Opposite Arm/Leg Raise  Right arm/Left leg;Left arm/Right leg;5 reps;3 seconds    Other Quadruped Lumbar Exercises  LT knee bent hip extensions 2x10 Bil TC for Lt  buttocks TC to help support position      Knee/Hip Exercises: Stretches   Other Knee/Hip Stretches  Knee to chest bil 3x 20 sec    Other Knee/Hip Stretches  Knee cross over stretch 3x 20 sec Lt only      Knee/Hip Exercises: Standing   Other Standing Knee Exercises  Donkey kicks LTLE at counter in supported plank 2x10 VC to contract abdominals      Iontophoresis   Type of Iontophoresis  Dexamethasone #5    Location  Lt hip    Dose  1 ml    Time  6 hr wear  Skin intact               PT Short Term Goals - 10/22/17 1201      PT SHORT TERM GOAL #3   Title  report < or = to 4/10 LBP at the end of the day     Time  4    Period  Weeks    Status  On-going 6/10        PT Long Term Goals - 10/03/17 1301      PT LONG TERM GOAL #1   Title  be independent in advanced HEP    Time  6    Period  Weeks    Status  On-going    Target Date  11/14/17      PT LONG TERM GOAL #2   Title  reduce FOTO to < or = to 38% limitation    Time  6    Period  Weeks    Status  On-going    Target Date  11/14/17      PT LONG TERM GOAL #3   Title  improve strength and endurance to walk for exercise without significant fatigue    Baseline  fatigue     Period  --    Status  Achieved mild fatigue      PT LONG TERM GOAL #4   Title  report < or = to 3/10 LBP at the end of the day    Baseline  4/10     Time  8    Period  Weeks    Status  On-going      PT LONG TERM GOAL #5   Title  verbalize safe progression of core strength and weight training exercise program and perform without increased pain    Time  6    Period  Weeks    Status  On-going Still on going    Target Date  11/14/17            Plan - 10/24/17 1148    Clinical Impression Statement  Applied last ionto patch today during the seesion. Kept the reps & sets the same as last session as the amount of work was extremely challenging.     Rehab Potential  Good    PT Frequency  2x / week    PT Duration  6 weeks    PT Treatment/Interventions  ADLs/Self Care Home Management;Cryotherapy;Electrical Stimulation;Ultrasound;Therapeutic activities;Therapeutic exercise;Patient/family education;Neuromuscular re-education;Manual techniques;Passive range of motion;Taping;Dry needling;Iontophoresis 4mg /ml Dexamethasone    PT Next Visit Plan  continue to build core, LT buttock and postural strength.  Work on endurance by increasing her reps withher hip strengthening exercises  Consulted and Agree with Plan of Care  Patient       Patient will benefit from skilled therapeutic intervention in order to improve the following deficits and impairments:  Decreased  activity tolerance, Decreased endurance, Decreased strength, Increased muscle spasms, Pain, Improper body mechanics, Impaired flexibility  Visit Diagnosis: Chronic bilateral low back pain without sciatica  Pain in thoracic spine  Muscle weakness (generalized)  Pain in left hip     Problem List Patient Active Problem List   Diagnosis Date Noted  . Status post laparoscopy-assisted vaginal hysterectomy 04/24/2017  . Dry eye syndrome 03/17/2016  . Headache 03/17/2016  . Hyperlipidemia 03/17/2016  . Lack of bladder control 03/17/2016  . Migraines 03/17/2016  . 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 MGD (meibomian gland disease) 02/27/2012    COCHRAN,JENNIFER, PTA 10/24/2017, 12:30 PM  [facility] [address] [place], [place], [address] Phone: [phone]   Fax:  [phone]  Name: [name] MRN: [id number] Date of Birth: Jul 08, 1951

## 2017-10-29 ENCOUNTER — Ambulatory Visit: Payer: Medicare Other

## 2017-10-29 DIAGNOSIS — M6281 Muscle weakness (generalized): Secondary | ICD-10-CM

## 2017-10-29 DIAGNOSIS — M25552 Pain in left hip: Secondary | ICD-10-CM

## 2017-10-29 DIAGNOSIS — M546 Pain in thoracic spine: Secondary | ICD-10-CM

## 2017-10-29 DIAGNOSIS — G8929 Other chronic pain: Secondary | ICD-10-CM

## 2017-10-29 DIAGNOSIS — M545 Low back pain, unspecified: Secondary | ICD-10-CM

## 2017-10-29 NOTE — Therapy (Signed)
[facility] [address], [address] [place], [place], [address] Phone: [phone]   Fax:  [phone]  Physical Therapy Treatment  Patient Details  Name: Whitney BellowsChristine H Mckelvy MRN: [id number] Date of Birth: 12/20/1950 Referring Provider: Shaune PollackGates, Donna, MD   Encounter Date: 10/29/2017  PT End of Session - 10/29/17 1227    Visit Number  23    Date for PT Re-Evaluation  11/14/17    Authorization Type  Medicare    Authorization Time Period  KX now    PT Start Time  1148    PT Stop Time  1232    PT Time Calculation (min)  44 min    Activity Tolerance  Patient tolerated treatment well    Behavior During Therapy  [facility] for tasks assessed/performed       Past Medical History:  Diagnosis Date  . Abnormal Pap smear of cervix 07/2016   CIN1  . Constipation   . Depression   . Diverticulosis   . H. pylori infection   . History of blood transfusion 1984  . Hx of colonic polyps   . Hypercholesteremia   . Hyponatremia   . Migraines   . Vitamin D deficiency     Past Surgical History:  Procedure Laterality Date  . ANTERIOR AND POSTERIOR REPAIR N/A 04/24/2017   Procedure: ANTERIOR (CYSTOCELE) AND POSTERIOR (RECTOCELE)  REPAIR;  Surgeon: Patton SallesAmundson C Silva, Brook E, MD;  Location: [facility];  Service: Gynecology;  Laterality: N/A;  . CESAREAN SECTION  1984  . COLONOSCOPY  2012  . COSMETIC SURGERY     Neck lift  . CYSTOSCOPY N/A 04/24/2017   Procedure: CYSTOSCOPY;  Surgeon: Patton SallesAmundson C Silva, Brook E, MD;  Location: [facility];  Service: Gynecology;  Laterality: N/A;  . HEMORRHOID SURGERY  1984  . LAPAROSCOPIC LYSIS OF ADHESIONS N/A 04/24/2017   Procedure: EXTENSIVE LAPAROSCOPIC LYSIS OF ADHESIONS;  Surgeon: Patton SallesAmundson C Silva, Brook E, MD;  Location: [facility];  Service: Gynecology;  Laterality: N/A;  . LAPAROSCOPIC VAGINAL HYSTERECTOMY WITH SALPINGO OOPHORECTOMY Bilateral 04/24/2017   Procedure: LAPAROSCOPIC ASSISTED VAGINAL HYSTERECTOMY WITH  SALPINGO OOPHORECTOMY and pelvic washings;  Surgeon: Patton SallesAmundson C Silva, Brook E, MD;  Location: [facility];  Service: Gynecology;  Laterality: Bilateral;  3 hours  . RECTAL PROLAPSE REPAIR     Dr. Kendrick Ranchim Davis  . UMBILICAL HERNIA REPAIR     had surgery as a child.    There were no vitals filed for this visit.  Subjective Assessment - 10/29/17 1147    Subjective  I am getting frustrated because my Lt hip is still hurting and my back is tired at the end of the day.  70% overall improvement.      Patient Stated Goals  care for her twin grand children, improve endurance for walking, reduce LBP and thoracic pain, establish a regular weight training routine    Currently in Pain?  No/denies low back and Lt hip yesterday was 9/10         [facility] PT Assessment - 10/29/17 0001      Observation/Other Assessments   Focus on Therapeutic Outcomes (FOTO)   41% limitation            No data recorded       [facility] Adult PT Treatment/Exercise - 10/29/17 0001      Lumbar Exercises: Aerobic   Stationary Bike  L2 x 10 min PTA present for status/update    Elliptical  R2 L3 x 6 min PTA  present to monitor      Lumbar Exercises: Quadruped   Opposite Arm/Leg Raise  Right arm/Left leg;Left arm/Right leg;5 reps;3 seconds    Other Quadruped Lumbar Exercises  LT knee bent hip extensions 2x10 Bil TC for Lt  buttocks TC to help support position      Knee/Hip Exercises: Stretches   Other Knee/Hip Stretches  Knee to chest bil 3x 20 sec    Other Knee/Hip Stretches  Knee cross over stretch 3x 20 sec Lt only      Knee/Hip Exercises: Standing   Other Standing Knee Exercises  Donkey kicks LTLE at counter in supported plank 2x10 VC to contract abdominals             PT Education - 10/29/17 1235    Education provided  Yes    Education Details  neuroscience of pain education    Person(s) Educated  Patient    Methods  Explanation    Comprehension  Verbalized understanding       PT Short Term Goals -  10/22/17 1201      PT SHORT TERM GOAL #3   Title  report < or = to 4/10 LBP at the end of the day    Time  4    Period  Weeks    Status  On-going 6/10        PT Long Term Goals - 10/29/17 1220      PT LONG TERM GOAL #2   Title  reduce FOTO to < or = to 38% limitation    Baseline  41% limitation    Time  6    Period  Weeks    Status  On-going      PT LONG TERM GOAL #3   Title  improve strength and endurance to walk for exercise without significant fatigue    Status  Achieved      PT LONG TERM GOAL #4   Title  report < or = to 3/10 LBP at the end of the day    Baseline  up to 9/10 at times    Time  8    Period  Weeks    Status  On-going            Plan - 10/29/17 1221    Clinical Impression Statement  Pt expresses frustration regarding continued fatigue and intermittent hip and lumbar pain.  Pt reported that she had 9/10 Lt hip and low back pain today.  Pt remains weak in the hips and core and is challenged by her current exercises.  Pt discussed continuation of exercises and follow-up with MD soon after discharge next session.     Rehab Potential  Good    PT Frequency  2x / week    PT Duration  6 weeks    PT Treatment/Interventions  ADLs/Self Care Home Management;Cryotherapy;Electrical Stimulation;Ultrasound;Therapeutic activities;Therapeutic exercise;Patient/family education;Neuromuscular re-education;Manual techniques;Passive range of motion;Taping;Dry needling;Iontophoresis 4mg /ml Dexamethasone    PT Next Visit Plan  finalize HEP, discharge next    Consulted and Agree with Plan of Care  Patient       Patient will benefit from skilled therapeutic intervention in order to improve the following deficits and impairments:  Decreased activity tolerance, Decreased endurance, Decreased strength, Increased muscle spasms, Pain, Improper body mechanics, Impaired flexibility  Visit Diagnosis: Chronic bilateral low back pain without sciatica  Pain in thoracic  spine  Muscle weakness (generalized)  Pain in left hip     Problem List Patient Active Problem List  Diagnosis Date Noted  . Status post laparoscopy-assisted vaginal hysterectomy 04/24/2017  . Dry eye syndrome 03/17/2016  . Headache 03/17/2016  . Hyperlipidemia 03/17/2016  . Lack of bladder control 03/17/2016  . Migraines 03/17/2016  . 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 MGD (meibomian gland disease) 02/27/2012     Lorrene Reid, PT 10/29/17 12:35 PM   [facility] [address], [address] [place], [place], [address] Phone: [phone]   Fax:  [phone]  Name: RACHELLE EDWARDS MRN: [id number] Date of Birth: 1951-05-10

## 2017-10-31 ENCOUNTER — Ambulatory Visit: Payer: Medicare Other

## 2017-10-31 DIAGNOSIS — M6281 Muscle weakness (generalized): Secondary | ICD-10-CM

## 2017-10-31 DIAGNOSIS — M545 Low back pain, unspecified: Secondary | ICD-10-CM

## 2017-10-31 DIAGNOSIS — G8929 Other chronic pain: Secondary | ICD-10-CM

## 2017-10-31 DIAGNOSIS — M25552 Pain in left hip: Secondary | ICD-10-CM

## 2017-10-31 DIAGNOSIS — M546 Pain in thoracic spine: Secondary | ICD-10-CM

## 2017-10-31 NOTE — Therapy (Signed)
[facility] [address] [place], [place], [address] Phone: [phone]   Fax:  [phone]  Physical Therapy Treatment  Patient Details  Name: [name] MRN: [id number] Date of Birth: [birth year] Referring Provider: Darcus Austin, MD   Encounter Date: 10/31/2017  PT End of Session - 10/31/17 1257    Visit Number  24    PT Start Time  3976    PT Stop Time  1302    PT Time Calculation (min)  37 min    Activity Tolerance  Patient tolerated treatment well    Behavior During Therapy  [facility] for tasks assessed/performed       Past Medical History:  Diagnosis Date  . Abnormal Pap smear of cervix 07/2016   CIN1  . Constipation   . Depression   . Diverticulosis   . H. pylori infection   . History of blood transfusion 1984  . Hx of colonic polyps   . Hypercholesteremia   . Hyponatremia   . Migraines   . Vitamin D deficiency     Past Surgical History:  Procedure Laterality Date  . ANTERIOR AND POSTERIOR REPAIR N/A 04/24/2017   Procedure: ANTERIOR (CYSTOCELE) AND POSTERIOR (RECTOCELE)  REPAIR;  Surgeon: Nunzio Cobbs, MD;  Location: [place] [facility];  Service: Gynecology;  Laterality: N/A;  . [place]  . COLONOSCOPY  2012  . COSMETIC SURGERY     Neck lift  . CYSTOSCOPY N/A 04/24/2017   Procedure: CYSTOSCOPY;  Surgeon: Nunzio Cobbs, MD;  Location: [place] [facility];  Service: Gynecology;  Laterality: N/A;  . [place]  . LAPAROSCOPIC LYSIS OF ADHESIONS N/A 04/24/2017   Procedure: EXTENSIVE LAPAROSCOPIC LYSIS OF ADHESIONS;  Surgeon: Nunzio Cobbs, MD;  Location: [place] [facility];  Service: Gynecology;  Laterality: N/A;  . LAPAROSCOPIC VAGINAL HYSTERECTOMY WITH SALPINGO OOPHORECTOMY Bilateral 04/24/2017   Procedure: LAPAROSCOPIC ASSISTED VAGINAL HYSTERECTOMY WITH SALPINGO OOPHORECTOMY and pelvic washings;  Surgeon: Nunzio Cobbs, MD;  Location: [place] [facility];  Service:  Gynecology;  Laterality: Bilateral;  3 hours  . RECTAL PROLAPSE REPAIR     Dr. Lennie Hummer  . UMBILICAL HERNIA REPAIR     had surgery as a child.    There were no vitals filed for this visit.  Subjective Assessment - 10/31/17 1235    Subjective  I am ready to D/C.  I want to try pilates.      Patient Stated Goals  care for her twin grand children, improve endurance for walking, reduce LBP and thoracic pain, establish a regular weight training routine    Currently in Pain?  Yes    Pain Score  2     Pain Location  Hip    Pain Orientation  Left    Pain Descriptors / Indicators  Sore    Pain Type  Chronic pain    Pain Onset  More than a month ago    Pain Frequency  Intermittent    Aggravating Factors   end of the day, overdoing it    Pain Relieving Factors  stretching, exercises         [facility] PT Assessment - 10/31/17 0001      Assessment   Medical Diagnosis  thoracolumbar back pain x 2-3 months      Prior Function   Level of Independence  Independent      Cognition   Overall Cognitive Status  Within Functional Limits  for tasks assessed      Observation/Other Assessments   Focus on Therapeutic Outcomes (FOTO)   41% limitation      Strength   Overall Strength Comments  4+/5 bil LE strength.  good abdominal contraction            No data recorded       [facility] Adult PT Treatment/Exercise - 10/31/17 0001      Lumbar Exercises: Aerobic   Stationary Bike  L2 x 10 min PTA present for status/update    Elliptical  R2 L3 x 6 min PTA present to monitor      Lumbar Exercises: Quadruped   Opposite Arm/Leg Raise  Right arm/Left leg;Left arm/Right leg;5 reps;3 seconds    Other Quadruped Lumbar Exercises  LT knee bent hip extensions 2x10 Bil TC for Lt  buttocks TC to help support position      Knee/Hip Exercises: Stretches   Other Knee/Hip Stretches  Knee to chest bil 3x 20 sec    Other Knee/Hip Stretches  Knee cross over stretch 3x 20 sec Lt only                PT Short Term Goals - 10/22/17 1201      PT SHORT TERM GOAL #3   Title  report < or = to 4/10 LBP at the end of the day    Time  4    Period  Weeks    Status  On-going 6/10        PT Long Term Goals - 10/31/17 1238      PT LONG TERM GOAL #1   Title  be independent in advanced HEP    Status  Achieved      PT LONG TERM GOAL #2   Title  reduce FOTO to < or = to 38% limitation    Baseline  41% limitation    Status  Partially Met      PT LONG TERM GOAL #3   Title  improve strength and endurance to walk for exercise without significant fatigue    Baseline  fatigue    Status  Partially Met      PT LONG TERM GOAL #4   Title  report < or = to 3/10 LBP at the end of the day    Baseline  2/10    Status  Achieved      PT LONG TERM GOAL #5   Title  verbalize safe progression of core strength and weight training exercise program and perform without increased pain    Status  Achieved            Plan - 10/31/17 1245    Clinical Impression Statement  Pt reports 70% overall improvement in symptoms since the start of care.  Pt denies pain over the past 2 days and last night pain was 2/10.  pt reported up to 9/10 Lt hip and low back pain last session.  Pain is intermittent based on activity.  Pt remains weak in her hips and core and is challenged by her current exercise and is compliant with daily exercise.  Pt conitnues to try to improve her endurance with walking.  Pt plans to follow-up with MD to discuss continued pain.      PT Next Visit Plan  D/C PT to HEP    Recommended Other Services  certification and recertification is signed    Consulted and Agree with Plan of Care  Patient  Patient will benefit from skilled therapeutic intervention in order to improve the following deficits and impairments:     Visit Diagnosis: Chronic bilateral low back pain without sciatica  Pain in thoracic spine  Muscle weakness (generalized)  Pain in left  hip     Problem List Patient Active Problem List   Diagnosis Date Noted  . Status post laparoscopy-assisted vaginal hysterectomy 04/24/2017  . Dry eye syndrome 03/17/2016  . Headache 03/17/2016  . Hyperlipidemia 03/17/2016  . Lack of bladder control 03/17/2016  . Migraines 03/17/2016  . 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 MGD (meibomian gland disease) 02/27/2012   PHYSICAL THERAPY DISCHARGE SUMMARY  Visits from Start of Care: 24  Current functional level related to goals / functional outcomes: Pt reports 80% overall improvement since the start of care.     Remaining deficits: Pt with endurance and functional strength deficits.  Pt reports intermittent and sometimes significant Lt hip and low back pain.  Pt has comprehensive HEP in place for continued gains.     Education / Equipment: HEP, posture/body mechanics Plan: Patient agrees to discharge.  Patient goals were partially met. Patient is being discharged due to being pleased with the current functional level.  ?????         Sigurd Sos, PT 10/31/17 1:00 PM   [facility] [address] [place], [place], [address] Phone: [phone]   Fax:  [phone]  Name: [name] MRN: [id number] Date of Birth: 03/12/1951

## 2017-11-14 ENCOUNTER — Telehealth: Payer: Self-pay | Admitting: Neurology

## 2017-11-14 NOTE — Telephone Encounter (Signed)
Pt left a voicemail message saying her headaches had returned and wants to know if she should increase her medication

## 2017-11-14 NOTE — Telephone Encounter (Signed)
Called and spoke with Pt. The headaches she's having are what she describes as extreme tension headaches. She wants to know if maybe she should increase her Depakote back to 3 tabs QD (750mg ), she's now taking 2 tabs (500mg ). She has had recent issues with her back, had a cortisone shot in her hip yesterday. Her PCP gave her Tramadol, which help her headaches, and wants to know if that is something you will Rx for her in the future if she feels she needs it. She also mentioned trying sumatriptan if need be. I advsd her I will return her call tomorrow, she asked I call after 10A

## 2017-11-15 MED ORDER — DIVALPROEX SODIUM ER 250 MG PO TB24: 750.0000 mg | ORAL_TABLET | Freq: Every day | ORAL | 3 refills | Status: DC

## 2017-11-15 MED ORDER — SUMATRIPTAN SUCCINATE 100 MG PO TABS: 100.0000 mg | ORAL_TABLET | ORAL | 3 refills | Status: DC | PRN

## 2017-11-15 NOTE — Telephone Encounter (Signed)
I advise trying sumatriptan first (earliest onset of headache and may repeat once after 2 hours if needed).  She has tried tramadol in the past but didn't want to continue taking it because it is an opioid.  Ideally, we do not use opioids to treat headache, so I usually use tramadol only as a last resort.

## 2017-11-15 NOTE — Telephone Encounter (Addendum)
Talked with Dr Everlena CooperJaffe prior to calling Pt, he agreed to increase Depakote to 750mg  QHS. He also wanted to make sure Pt was aware she should limit the use of pain relievers, including triptans to 2 days a week to lessen the chance of rebound headaches. Called Pt, confirmed [facility]. Pt verbalized understanding of above.

## 2017-11-19 ENCOUNTER — Telehealth: Payer: Self-pay | Admitting: Neurology

## 2017-11-19 NOTE — Telephone Encounter (Signed)
Dr Everlena CooperJaffe-  Pt has had Zofran in the past, OK to call in 4mg , 1Q6-8 PRN nausea?

## 2017-11-19 NOTE — Telephone Encounter (Signed)
Called [facility], spoke with Angie, gave verbal for #20 r1. Pt aware.

## 2017-11-19 NOTE — Telephone Encounter (Signed)
yes

## 2017-11-19 NOTE — Telephone Encounter (Signed)
Patient called to say that she has a really bad migraine and she would like something called in for [facility]ausea to [facility]. Thanks

## 2017-11-22 ENCOUNTER — Telehealth: Payer: Self-pay | Admitting: Neurology

## 2017-11-22 MED ORDER — PREDNISONE 10 MG (21) PO TBPK: ORAL_TABLET | ORAL | 0 refills | Status: DC

## 2017-11-22 NOTE — Telephone Encounter (Signed)
*  STAT* If patient is at the pharmacy, call can be transferred to refill team.  1.     Which medications need to be refilled? (please list name of each medication and dose if know) Sumatriptan  2.     Which pharmacy/location (including street and city if local pharmacy) is medication to be sent to?   3.     Do they need a 30 or 90 day supply?

## 2017-11-22 NOTE — Telephone Encounter (Signed)
Before calling Pt, I called [facility] to ensure they rcvd my fax last week for #10 sumatriptan R3, spoke with Angie. The Rx was rcvd and Pt had delivered on 11/15/17.  Called Pt, advsd her is too soon to refill sumatriptan, the Rx she rcvd on 11/15/17 is meant to last 30 days, she should not be taking more than 2 in 24 hours or 2 days a week. Pt also has tramadol 50mg  from PCP, I advsd her to use that as a last resort. I advsd her if she felt this was the worst headache she has ever had, she should proceed to the ED. Pt stated she was not going to the ED to wait for 4 hours and then they do nothing. Pt states she has been in a dark room with ice packs on her head all week. She insisted someone do something, she is in severe pain.  I consulted Dr Allena KatzPatel about Prednisone taper, she agreed. [facility]. Called Pt, advsd her if she wants Rx delivered, [facility]. Advsd her to avoid NSAIDs while on Prednisone, keep hydrated. She asked many questions about what medications she was allowed to take while on Predisone, advsd her to take her medications as she normally takes, the Prednisone would not interfere with those. She wanted to know if can take tramdol, advsd her she may the tramadol as directed, but should still limit use of pain reieveres to 2 days out of the week. She asked when she could take the last 3 sumatriptan she has, I advsd her to wait until Sunday, take one if needed, may repeat in 2 hours if needed. Pt is to call on Monday with update.

## 2017-11-24 ENCOUNTER — Ambulatory Visit (HOSPITAL_COMMUNITY)
Admission: EM | Admit: 2017-11-24 | Discharge: 2017-11-24 | Disposition: A | Discharge status: Home / Self Care | Payer: Medicare Other | Attending: Family Medicine | Admitting: Family Medicine

## 2017-11-24 ENCOUNTER — Encounter (HOSPITAL_COMMUNITY): Payer: Self-pay | Admitting: *Deleted

## 2017-11-24 DIAGNOSIS — G43909 Migraine, unspecified, not intractable, without status migrainosus: Secondary | ICD-10-CM

## 2017-11-24 DIAGNOSIS — R110 Nausea: Secondary | ICD-10-CM

## 2017-11-24 MED ORDER — KETOROLAC TROMETHAMINE 30 MG/ML IJ SOLN
30.0000 mg | Freq: Once | INTRAMUSCULAR | Status: AC
  Administered 2017-11-24: 30 mg via INTRAMUSCULAR

## 2017-11-24 MED ORDER — KETOROLAC TROMETHAMINE 30 MG/ML IJ SOLN
INTRAMUSCULAR | Status: AC
  Filled 2017-11-24: qty 1

## 2017-11-24 MED ORDER — SUMATRIPTAN SUCCINATE 6 MG/0.5ML ~~LOC~~ SOLN
SUBCUTANEOUS | Status: AC
  Filled 2017-11-24: qty 0.5

## 2017-11-24 MED ORDER — SUMATRIPTAN SUCCINATE 6 MG/0.5ML ~~LOC~~ SOLN
6.0000 mg | Freq: Once | SUBCUTANEOUS | Status: AC
  Administered 2017-11-24: 6 mg via SUBCUTANEOUS

## 2017-11-24 MED ORDER — METOCLOPRAMIDE HCL 5 MG/ML IJ SOLN
INTRAMUSCULAR | Status: AC
  Filled 2017-11-24: qty 2

## 2017-11-24 MED ORDER — METOCLOPRAMIDE HCL 5 MG/ML IJ SOLN
5.0000 mg | Freq: Once | INTRAMUSCULAR | Status: AC
  Administered 2017-11-24: 5 mg via INTRAMUSCULAR

## 2017-11-24 NOTE — ED Provider Notes (Signed)
[facility]    CSN: [id number] Arrival date & time: 11/24/17  1319     History   Chief Complaint Chief Complaint  Patient presents with  . Migraine    HPI [name] is a 67 y.o. female.   [name] presents with complaints of migraine headache she has had for the past week. History of migraines and feels similar to others. Light, sound and movement does worsen pain. Nausea without vomiting. Has been taking depakote and librium nightly. Has been working with her neurologist who increased her depakote dose as well as started her on prednisone taper which she started 3 days days ago. These have not helped. Took imitrex last 4/16 which did not help. zofran has not helped with nausea. Without dizziness. No head injury. Pain is to left eye and posterior head. Rates pain 10/10. Intermittently takes tramadol and tylenol for back pain but doesn't take more than twice a week. History of headache, constipation, depression, hypercholesteremia.    ROS per HPI.      Past Medical History:  Diagnosis Date  . Abnormal Pap smear of cervix 07/2016   CIN1  . Constipation   . Depression   . Diverticulosis   . H. pylori infection   . History of blood transfusion 1984  . Hx of colonic polyps   . Hypercholesteremia   . Hyponatremia   . Migraines   . Vitamin D deficiency     Patient Active Problem List   Diagnosis Date Noted  . Status post laparoscopy-assisted vaginal hysterectomy 04/24/2017  . Dry eye syndrome 03/17/2016  . Headache 03/17/2016  . Hyperlipidemia 03/17/2016  . Lack of bladder control 03/17/2016  . Migraines 03/17/2016  . 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 MGD (meibomian gland disease) 02/27/2012    Past Surgical History:  Procedure Laterality Date  . ABDOMINAL HYSTERECTOMY    . ANTERIOR AND POSTERIOR REPAIR N/A 04/24/2017   Procedure: ANTERIOR (CYSTOCELE) AND POSTERIOR (RECTOCELE)  REPAIR;  Surgeon: Patton Salles, MD;   Location: [facility];  Service: Gynecology;  Laterality: N/A;  . CESAREAN SECTION  1984  . COLONOSCOPY  2012  . COSMETIC SURGERY     Neck lift  . CYSTOSCOPY N/A 04/24/2017   Procedure: CYSTOSCOPY;  Surgeon: Patton Salles, MD;  Location: [facility];  Service: Gynecology;  Laterality: N/A;  . HEMORRHOID SURGERY  1984  . LAPAROSCOPIC LYSIS OF ADHESIONS N/A 04/24/2017   Procedure: EXTENSIVE LAPAROSCOPIC LYSIS OF ADHESIONS;  Surgeon: Patton Salles, MD;  Location: [facility];  Service: Gynecology;  Laterality: N/A;  . LAPAROSCOPIC VAGINAL HYSTERECTOMY WITH SALPINGO OOPHORECTOMY Bilateral 04/24/2017   Procedure: LAPAROSCOPIC ASSISTED VAGINAL HYSTERECTOMY WITH SALPINGO OOPHORECTOMY and pelvic washings;  Surgeon: Patton Salles, MD;  Location: [facility];  Service: Gynecology;  Laterality: Bilateral;  3 hours  . RECTAL PROLAPSE REPAIR     Dr. Kendrick Ranch  . UMBILICAL HERNIA REPAIR     had surgery as a child.    OB History    Gravida  2   Para  2   Term  1   Preterm      AB      Living  2     SAB      TAB      Ectopic      Multiple      Live Births  2            Home Medications  Prior to Admission medications   Medication Sig Start Date End Date Taking? Authorizing Provider  chlordiazePOXIDE (LIBRIUM) 10 MG capsule Take 20 mg by mouth at bedtime.  08/10/16  Yes [provider]  cholecalciferol (VITAMIN D) 1000 units tablet Take 1,000 Units by mouth at bedtime.   Yes [provider]  divalproex (DEPAKOTE ER) 250 MG 24 hr tablet Take 3 tablets (750 mg total) by mouth at bedtime. 11/15/17  Yes Jaffe, Adam R, DO  lansoprazole (PREVACID) 30 MG capsule Take 30 mg by mouth 2 (two) times daily before a meal.    Yes [provider]  predniSONE (STERAPRED UNI-PAK 21 TAB) 10 MG (21) TBPK tablet As directed 11/22/17  Yes Patel, Donika K, DO  SUMAtriptan (IMITREX) 100 MG tablet Take 1 tablet (100 mg total) by mouth every 2 (two) hours as  needed for migraine. May repeat in 2 hours if headache persists or recurs. No more than 2 doses in 24hrs. 11/15/17  Yes Jaffe, Adam R, DO  traMADol (ULTRAM) 50 MG tablet Take 50 mg by mouth daily.   Yes [provider]  acetaminophen (TYLENOL) 325 MG tablet Take 325 mg by mouth every 6 (six) hours as needed.     [provider]  divalproex (DEPAKOTE ER) 250 MG 24 hr tablet TAKE 2 TABLETS AT BEDTIME. 07/05/17   Everlena Cooper, Adam R, DO  docusate sodium (COLACE) 100 MG capsule Take 100 mg by mouth at bedtime.     [provider]  Omega-3 Fatty Acids (FISH OIL PO) Take 1 capsule by mouth daily.    [provider]  polyethylene glycol (MIRALAX / GLYCOLAX) packet Take 17 g by mouth at bedtime.     [provider]  Simethicone (PHAZYME MAXIMUM STRENGTH) 250 MG CAPS Take 250 mg by mouth daily as needed (gas).    [provider]  SUMAtriptan (IMITREX) 100 MG tablet Take 1 tablet.  May repeat once in 2 hours if headache persists or recurs. Patient not taking: Reported on 08/09/2017 03/17/16   Drema Dallas, DO  XIIDRA 5 % SOLN Place 1 drop into both eyes 2 (two) times daily.  02/21/16   [provider]    Family History Family History  Problem Relation Age of Onset  . Migraines Mother   . Migraines Sister   . Cancer Father        Prostate    Social History Social History   Tobacco Use  . Smoking status: Never Smoker  . Smokeless tobacco: Never Used  Substance Use Topics  . Alcohol use: No  . Drug use: No     Allergies   Aspirin; Crestor [rosuvastatin calcium]; Inderal [propranolol]; Other; Relpax [eletriptan hydrobromide]; Salicylates; and Topamax [topiramate]   Review of Systems Review of Systems   Physical Exam Triage Vital Signs ED Triage Vitals  Enc Vitals Group     BP 11/24/17 1448 134/64     Pulse Rate 11/24/17 1448 71     Resp 11/24/17 1448 16     Temp 11/24/17 1448 98.2 F (36.8 C)     Temp Source 11/24/17 1448 Oral       SpO2 11/24/17 1448 100 %     Weight --      Height --      Head Circumference --      Peak Flow --      Pain Score 11/24/17 1449 10     Pain Loc --      Pain Edu? --  Excl. in GC? --    No data found.  Updated Vital Signs BP 134/64   Pulse 71   Temp 98.2 F (36.8 C) (Oral)   Resp 16   LMP 08/07/2001 (Approximate)   SpO2 100%   Visual Acuity Right Eye Distance:   Left Eye Distance:   Bilateral Distance:    Right Eye Near:   Left Eye Near:    Bilateral Near:     Physical Exam  Constitutional: She is oriented to person, place, and time. She appears well-developed and well-nourished. No distress.  Cardiovascular: Normal rate, regular rhythm and normal heart sounds.  Pulmonary/Chest: Effort normal and breath sounds normal.  Musculoskeletal: Normal range of motion.  Neurological: She is alert and oriented to person, place, and time. She has normal strength. No cranial nerve deficit or sensory deficit. She exhibits normal muscle tone. Coordination normal. GCS eye subscore is 4. GCS verbal subscore is 5. GCS motor subscore is 6.  Skin: Skin is warm and dry.     UC Treatments / Results  Labs (all labs ordered are listed, but only abnormal results are displayed) Labs Reviewed - No data to display  EKG None Radiology No results found.  Procedures Procedures (including critical care time)  Medications Ordered in UC Medications  metoCLOPramide (REGLAN) injection 5 mg (has no administration in time range)  SUMAtriptan (IMITREX) injection 6 mg (has no administration in time range)  ketorolac (TORADOL) 30 MG/ML injection 30 mg (has no administration in time range)     Initial Impression / Assessment and Plan / UC Course  I have reviewed the triage vital signs and the nursing notes.  Pertinent labs & imaging results that were available during my care of the patient were reviewed by me and considered in my medical decision making (see chart for details).      Alert, non toxic in appearance. Wearing sunglasses in room. Normal neurological exam. Reglan, toradol, imitrex provided in clinic, encouraged rest and to continue with home medication regimen, continue to follow with neuro, return or go to ER if worsening. Patient verbalized understanding and agreeable to plan.  Son drove her to and from clinic.    Final Clinical Impressions(s) / UC Diagnoses   Final diagnoses:  Migraine without status migrainosus, not intractable, unspecified migraine type    ED Discharge Orders    None       Controlled Substance Prescriptions [place] Controlled Substance Registry consulted? Not Applicable   Georgetta HaberBurky, Hind Chesler B, NP 11/24/17 [phone]

## 2017-11-24 NOTE — Discharge Instructions (Signed)
May further take 1 tab of benadryl to promote sleep as this can help with headache.  May take ibuprofen tomorrow if needed for persistent headache. Please continue to follow up with neurology for any persistent symptoms. If worsening of symptoms please return or go to the Er.

## 2017-11-24 NOTE — ED Triage Notes (Signed)
Pt sees Dr. Everlena CooperJaffe (neuro) for migraines; has had this migraine over past wk; was placed on Depakote & prednisone this past wk.  spoke with Dr Moises BloodJaffe's nurse yesterday - was told if not any better over weekend [facility]

## 2017-11-26 ENCOUNTER — Telehealth: Payer: Self-pay | Admitting: Neurology

## 2017-11-26 NOTE — Telephone Encounter (Signed)
[facility] and had a Headache injection and it seems to be much better. She wanted to let him know so he could look at her notes. She also said that the Prednisone made her feel "out of it and loopy". Thanks

## 2017-12-03 ENCOUNTER — Encounter: Payer: Self-pay | Admitting: Neurology

## 2017-12-06 ENCOUNTER — Ambulatory Visit: Payer: Medicare Other | Admitting: Neurology

## 2017-12-06 ENCOUNTER — Other Ambulatory Visit: Payer: Medicare Other

## 2017-12-06 ENCOUNTER — Encounter: Payer: Self-pay | Admitting: Neurology

## 2017-12-06 VITALS — BP 108/64 | HR 75 | Ht 59.0 in | Wt 126.0 lb

## 2017-12-06 DIAGNOSIS — Z79899 Other long term (current) drug therapy: Secondary | ICD-10-CM | POA: Diagnosis not present

## 2017-12-06 DIAGNOSIS — G43019 Migraine without aura, intractable, without status migrainosus: Secondary | ICD-10-CM | POA: Diagnosis not present

## 2017-12-06 LAB — CBC
HEMATOCRIT: 35.8 % (ref 35.0–45.0)
Hemoglobin: 12.7 g/dL (ref 11.7–15.5)
MCH: 32.6 pg (ref 27.0–33.0)
MCHC: 35.5 g/dL (ref 32.0–36.0)
MCV: 92 fL (ref 80.0–100.0)
MPV: 9.4 fL (ref 7.5–12.5)
Platelets: 216 10*3/uL (ref 140–400)
RBC: 3.89 10*6/uL (ref 3.80–5.10)
RDW: 11.7 % (ref 11.0–15.0)
WBC: 10.9 10*3/uL — ABNORMAL HIGH (ref 3.8–10.8)

## 2017-12-06 MED ORDER — ERENUMAB-AOOE 70 MG/ML ~~LOC~~ SOAJ
140.0000 mg | Freq: Once | SUBCUTANEOUS | 0 refills | Status: AC
Start: 2017-12-06 — End: 2017-12-06

## 2017-12-06 MED ORDER — ERENUMAB-AOOE 70 MG/ML ~~LOC~~ SOAJ: 140.0000 mg | SUBCUTANEOUS | 11 refills | Status: DC

## 2017-12-06 NOTE — Progress Notes (Signed)
NEUROLOGY FOLLOW UP OFFICE NOTE  [name] [id number]  HISTORY OF PRESENT ILLNESS: [name] is a 67 year old right-handed woman with depression and anxiety with history of benzodiazepine and barbiturates dependence and gastric ulcer who follows up for migraine.   UPDATE: Migraines were well-controlled until this past March.  In September, she had a hysterectomy with subsequent prolonged recovery.  As she had been laying on the sofa for 3 months, she developed back pain which required PT and Tylenol and tramadol.  She was taking pain relievers frequently.  Depakote was increased from  to  almost a month ago.  She had a prednisone taper which made her feel "loopy" and "out of it".  She was evaluated at Urgent Care on 4/20 and received a headache cocktail which provided relief for about 4 hours Intensity:  severe Duration:  2 hours Frequency:  3 days a week Current NSAIDS:  contraindicated Current analgesics:  Tylenol, tramadol  Current triptans:  sumatriptan Current anti-emetic:  Zofran  Current muscle relaxants:  no Current anti-anxiolytic:  Librium Current sleep aide:  Librium Current Antihypertensive medications:  no Current Antidepressant medications:  no Current Anticonvulsant medications:  Depakote ER  Current Vitamins/Herbal/Supplements:  no Current Antihistamines/Decongestants:  no Other medication:  Estradiol  Caffeine:  1 cup 1/2 decaff coffee daily Alcohol:  no Smoker:  no Diet:  hydrates Depression/anxiety:  yes Sleep hygiene:  Poor   HISTORY: Onset:  She has remote history of migraines which were controlled for many years.  In December 2015, her husband unexpectedly passed away, which triggered depression and anxiety.  She subsequently recovered.  However, in early June, she witnessed her boyfriend's ill father pass away.  It triggered flashbacks of her husband's death, which increased depression, anxiety and migraines.  She has  significant history of side effects to multiple medications. Location:  Left peri-orbital Quality:  pounding Initial Intensity:  10/10 Aura:  no Prodrome:  no Associated symptoms:  Nausea, photophobia, phonophobia, osmophobia Initial Duration:  All day Initial Frequency:  15 days per month Triggers/exacerbating factors:  Stress, light Relieving factors:  Tramadol helps relieve it. Activity:  aggravates   Past NSAIDS:  Contraindicated due to bleeding ulcer Past analgesics:  Cannot take ASA products such as Excedrin due to bleeding ulcer Past abortive triptans:  Relpax (took it multiple times but caused chest tightness once, but was effective), sumatriptan (many years ago) Past anti-emetic:  Zofran  (effective) Past anti-anxiolytic:  benzodiazepines Past antihypertensive medications:  propranolol (chest tightness Past antidepressant medications:  Multiple, such as Cymbalta.  She has had intolerance to multiple antidepressants over the years. Past anticonvulsant medications:  topiramate (chest tightness), Depakote (many years ago) Past vitamins/Herbal/Supplements:  no   Family history of headache:  Mom, sister   MRI and MRA of head from 11/05/10 were personally reviewed and were unremarkable.  PAST MEDICAL HISTORY: Past Medical History:  Diagnosis Date  . Abnormal Pap smear of cervix 07/2016   CIN1  . Constipation   . Depression   . Diverticulosis   . H. pylori infection   . History of blood transfusion 1984  . Hx of colonic polyps   . Hypercholesteremia   . Hyponatremia   . Migraines   . Vitamin D deficiency     MEDICATIONS: Current Outpatient Medications on File Prior to Visit  Medication Sig Dispense Refill  . acetaminophen (TYLENOL) 325 MG tablet Take 325 mg by mouth every 6 (six) hours as needed.     . chlordiazePOXIDE (  LIBRIUM) 10 MG capsule Take 20 mg by mouth at bedtime.     . cholecalciferol (VITAMIN D) 1000 units tablet Take 1,000 Units by mouth at bedtime.     . divalproex (DEPAKOTE ER) 250 MG 24 hr tablet TAKE 2 TABLETS AT BEDTIME. 60 tablet 5  . divalproex (DEPAKOTE ER) 250 MG 24 hr tablet Take 3 tablets (750 mg total) by mouth at bedtime. 90 tablet 3  . docusate sodium (COLACE) 100 MG capsule Take 100 mg by mouth at bedtime.     . lansoprazole (PREVACID) 30 MG capsule Take 30 mg by mouth 2 (two) times daily before a meal.     . Omega-3 Fatty Acids (FISH OIL PO) Take 1 capsule by mouth daily.    . polyethylene glycol (MIRALAX / GLYCOLAX) packet Take 17 g by mouth at bedtime.     . predniSONE (STERAPRED UNI-PAK 21 TAB) 10 MG (21) TBPK tablet As directed (Patient not taking: Reported on 12/06/2017) 21 tablet 0  . Simethicone (PHAZYME MAXIMUM STRENGTH) 250 MG CAPS Take 250 mg by mouth daily as needed (gas).    . SUMAtriptan (IMITREX) 100 MG tablet Take 1 tablet.  May repeat once in 2 hours if headache persists or recurs. (Patient not taking: Reported on 08/09/2017) 10 tablet 2  . SUMAtriptan (IMITREX) 100 MG tablet Take 1 tablet (100 mg total) by mouth every 2 (two) hours as needed for migraine. May repeat in 2 hours if headache persists or recurs. No more than 2 doses in 24hrs. 10 tablet 3  . traMADol (ULTRAM) 50 MG tablet Take 50 mg by mouth daily.    [name] XIIDRA 5 % SOLN Place 1 drop into both eyes 2 (two) times daily.      No current facility-administered medications on file prior to visit.     ALLERGIES: Allergies  Allergen Reactions  . Aspirin Other (See Comments)    Hx of bleeding stomach ulcer   . Crestor [Rosuvastatin Calcium] Itching  . Inderal [Propranolol] Other (See Comments)    Chest tightness  . Other     Other reaction(s): Other (See Comments) Cholesterol meds-multiple reactions to multiple meds  . Relpax [Eletriptan Hydrobromide] Other (See Comments)    Chest Tightness   . Salicylates Other (See Comments)    Told not to take due to hx of GI ulcer  . Topamax [Topiramate] Other (See Comments)    Chest tightness    FAMILY  HISTORY: Family History  Problem Relation Age of Onset  . Migraines Mother   . Migraines Sister   . Cancer Father        Prostate    SOCIAL HISTORY: Social History   Socioeconomic History  . Marital status: Widowed    Spouse name: Not on file  . Number of children: Not on file  . Years of education: Not on file  . Highest education level: Not on file  Occupational History  . Occupation: LPN  Social Needs  . Financial resource strain: Not on file  . Food insecurity:    Worry: Not on file    Inability: Not on file  . Transportation needs:    Medical: Not on file    Non-medical: Not on file  Tobacco Use  . Smoking status: Never Smoker  . Smokeless tobacco: Never Used  Substance and Sexual Activity  . Alcohol use: No  . Drug use: No  . Sexual activity: Not on file    Comment: Hyst  Lifestyle  . Physical activity:  Days per week: Not on file    Minutes per session: Not on file  . Stress: Not on file  Relationships  . Social connections:    Talks on phone: Not on file    Gets together: Not on file    Attends religious service: Not on file    Active member of club or organization: Not on file    Attends meetings of clubs or organizations: Not on file    Relationship status: Not on file  . Intimate partner violence:    Fear of current or ex partner: Not on file    Emotionally abused: Not on file    Physically abused: Not on file    Forced sexual activity: Not on file  Other Topics Concern  . Not on file  Social History Narrative   Pt has L.P.N through [facility]    REVIEW OF SYSTEMS: Constitutional: No fevers, chills, or sweats, no generalized fatigue, change in appetite Eyes: No visual changes, double vision, eye pain Ear, nose and throat: No hearing loss, ear pain, nasal congestion, sore throat Cardiovascular: No chest pain, palpitations Respiratory:  No shortness of breath at rest or with exertion, wheezes GastrointestinaI: No nausea, vomiting, diarrhea,  abdominal pain, fecal incontinence Genitourinary:  No dysuria, urinary retention or frequency Musculoskeletal:  No neck pain, back pain Integumentary: No rash, pruritus, skin lesions Neurological: as above Psychiatric: depression, insomnia, anxiety Endocrine: No palpitations, fatigue, diaphoresis, mood swings, change in appetite, change in weight, increased thirst Hematologic/Lymphatic:  No purpura, petechiae. Allergic/Immunologic: no itchy/runny eyes, nasal congestion, recent allergic reactions, rashes  PHYSICAL EXAM: Vitals:   12/06/17 1141  BP: 108/64  Pulse: 75  SpO2: 99%   General: No acute distress.  Patient appears well-groomed.   IMPRESSION: Migraine without aura, increased frequency  PLAN: 1.  She will continue Depakote ER  daily for now.  Check CBC and CMP 2.  We will start her on Aimovig today 3.  Continue sumatriptan or Tylenol as needed, but limit to no more than 2 days out of week 4.  Headache diary 5.  Follow up in 3 months.  21 minutes spent face to face with patient, 100% spent discussing management.  Shon Millet, DO  CC: Dr. Kevan Ny

## 2017-12-06 NOTE — Addendum Note (Signed)
Addended by: Dorthy Cooler on: 12/06/2017 04:15 PM   Modules accepted: Orders

## 2017-12-06 NOTE — Patient Instructions (Addendum)
1.  We will start Aimovig, once monthly.  2.  Continue the sumatriptan and/or Tylenol.  Must limit use to no more than 2 days out of week  3  Continue Depakote  daily.  Check CBC and CMP. Your provider has requested that you have labwork completed today. Please go to [facility] ([id number]) on the second floor of this building before leaving the office today. You do not need to check in. If you are not called within 15 minutes please check with the front desk.   4.  Follow up in 3 months

## 2017-12-07 LAB — COMPREHENSIVE METABOLIC PANEL
AG Ratio: 2 (calc) (ref 1.0–2.5)
ALBUMIN MSPROF: 4.2 g/dL (ref 3.6–5.1)
ALT: 8 U/L (ref 6–29)
AST: 11 U/L (ref 10–35)
Alkaline phosphatase (APISO): 45 U/L (ref 33–130)
BUN: 13 mg/dL (ref 7–25)
CHLORIDE: 94 mmol/L — AB (ref 98–110)
CO2: 23 mmol/L (ref 20–32)
CREATININE: 0.59 mg/dL (ref 0.50–0.99)
Calcium: 9.1 mg/dL (ref 8.6–10.4)
GLOBULIN: 2.1 g/dL (ref 1.9–3.7)
GLUCOSE: 76 mg/dL (ref 65–99)
POTASSIUM: 3.7 mmol/L (ref 3.5–5.3)
SODIUM: 129 mmol/L — AB (ref 135–146)
TOTAL PROTEIN: 6.3 g/dL (ref 6.1–8.1)
Total Bilirubin: 0.7 mg/dL (ref 0.2–1.2)

## 2017-12-07 LAB — SPECIMEN COMPROMISED

## 2017-12-07 NOTE — Telephone Encounter (Signed)
Called and spoke with Pt. We discussed her having these same symptoms 11 days ago. Pt states she does not have a headache today, though feels loopy and nauseated. I asked if there was actual vomiting, she said no. I asked if she was having any difficulty breathing or tightness in chest, Pt denies any type of these symptoms. Pt mentioned having ondansetron  from another dr, though she claims it has not helped her in the past. I advised her to try it again, and to sip small amounts of water to see if feels better. Advised her also of the BRAT diet until the nausea subsides.

## 2017-12-07 NOTE — Telephone Encounter (Signed)
Pt called and wanted to talk to [place] she is feeling foggy and nauseated and feels out of it, wanted to know if it could be from the shot yesterday, please call and advise

## 2017-12-13 ENCOUNTER — Telehealth: Payer: Self-pay | Admitting: Neurology

## 2017-12-13 NOTE — Telephone Encounter (Signed)
Patient states that she is still having headache everyday even after the injection. Patient states that Dr Everlena Cooper sates he might would go up on the Depakote. Please call

## 2017-12-13 NOTE — Telephone Encounter (Signed)
Called and spoke with Pt, advsd her she will need to give the CGRP more time to work and it is not appropriate to increase Depakote at this time. Pt had questions of how many times per week, and what medications she could take. Explained to Pt multiple times to only use pain relievers 2 days out of the week, whether it be triptans, tylenol or tramadol.

## 2017-12-17 ENCOUNTER — Encounter: Payer: Self-pay | Admitting: Neurology

## 2017-12-17 DIAGNOSIS — G43019 Migraine without aura, intractable, without status migrainosus: Secondary | ICD-10-CM

## 2017-12-17 DIAGNOSIS — Z79899 Other long term (current) drug therapy: Secondary | ICD-10-CM

## 2017-12-18 MED ORDER — DIVALPROEX SODIUM ER 250 MG PO TB24: 1000.0000 mg | ORAL_TABLET | Freq: Every day | ORAL | 1 refills | Status: DC

## 2017-12-18 NOTE — Addendum Note (Signed)
Addended by: Dorthy Cooler on: 12/18/2017 03:28 PM   Modules accepted: Orders

## 2017-12-19 NOTE — Progress Notes (Addendum)
Initiated on cover my meds KEY [id number]  Rcvd approval for Aimovig   [id number] until 03/21/18  [facility], spoke with Lawson Fiscal, it will be a $50 copay

## 2017-12-28 ENCOUNTER — Encounter: Payer: Self-pay | Admitting: Neurology

## 2018-01-11 ENCOUNTER — Other Ambulatory Visit: Payer: Medicare Other

## 2018-01-11 ENCOUNTER — Encounter: Payer: Self-pay | Admitting: Neurology

## 2018-01-11 DIAGNOSIS — Z79899 Other long term (current) drug therapy: Secondary | ICD-10-CM

## 2018-01-12 LAB — COMPREHENSIVE METABOLIC PANEL
AG RATIO: 2 (calc) (ref 1.0–2.5)
ALT: 5 U/L — AB (ref 6–29)
AST: 12 U/L (ref 10–35)
Albumin: 4.1 g/dL (ref 3.6–5.1)
Alkaline phosphatase (APISO): 41 U/L (ref 33–130)
BILIRUBIN TOTAL: 0.4 mg/dL (ref 0.2–1.2)
BUN: 16 mg/dL (ref 7–25)
CALCIUM: 9.2 mg/dL (ref 8.6–10.4)
CHLORIDE: 95 mmol/L — AB (ref 98–110)
CO2: 26 mmol/L (ref 20–32)
Creat: 0.64 mg/dL (ref 0.50–0.99)
GLOBULIN: 2.1 g/dL (ref 1.9–3.7)
Glucose, Bld: 87 mg/dL (ref 65–99)
Potassium: 3.8 mmol/L (ref 3.5–5.3)
Sodium: 127 mmol/L — ABNORMAL LOW (ref 135–146)
TOTAL PROTEIN: 6.2 g/dL (ref 6.1–8.1)

## 2018-01-12 LAB — CBC WITH DIFFERENTIAL/PLATELET
BASOS ABS: 61 {cells}/uL (ref 0–200)
Basophils Relative: 1.1 %
Eosinophils Absolute: 72 cells/uL (ref 15–500)
Eosinophils Relative: 1.3 %
HEMATOCRIT: 38 % (ref 35.0–45.0)
HEMOGLOBIN: 13.2 g/dL (ref 11.7–15.5)
LYMPHS ABS: 2074 {cells}/uL (ref 850–3900)
MCH: 32.8 pg (ref 27.0–33.0)
MCHC: 34.7 g/dL (ref 32.0–36.0)
MCV: 94.3 fL (ref 80.0–100.0)
MONOS PCT: 12.1 %
MPV: 9.6 fL (ref 7.5–12.5)
NEUTROS ABS: 2629 {cells}/uL (ref 1500–7800)
Neutrophils Relative %: 47.8 %
Platelets: 171 10*3/uL (ref 140–400)
RBC: 4.03 10*6/uL (ref 3.80–5.10)
RDW: 12 % (ref 11.0–15.0)
Total Lymphocyte: 37.7 %
WBC mixed population: 666 cells/uL (ref 200–950)
WBC: 5.5 10*3/uL (ref 3.8–10.8)

## 2018-01-15 ENCOUNTER — Telehealth: Payer: Self-pay

## 2018-01-15 NOTE — Telephone Encounter (Signed)
Called and spoke with Pt, advised her of lab results. She states Dr Kevan NyGates is aware of low Na, and believes her Na may just run low.

## 2018-01-15 NOTE — Telephone Encounter (Signed)
-----   Message from Drema DallasAdam R Jaffe, DO sent at 01/14/2018  4:25 PM EDT ----- Reviewed labs.  Platelet count and liver function tests are normal.  Her sodium level is low (it has been for alt least almost a year).  I don't know if it is due to the Depakote.  My goal is to discontinue Depakote anyway.  However, I would recommend following up with her PCP to determine if there are other possible causes for the low sodium level.

## 2018-03-03 ENCOUNTER — Other Ambulatory Visit: Payer: Self-pay | Admitting: Neurology

## 2018-03-04 NOTE — Telephone Encounter (Signed)
Refill for now

## 2018-03-04 NOTE — Telephone Encounter (Signed)
Are you going to continue this?  One of your notes says that you may want to d/c it.  Please advise.

## 2018-03-21 ENCOUNTER — Ambulatory Visit: Payer: Medicare Other | Admitting: Neurology

## 2018-03-21 ENCOUNTER — Encounter

## 2018-03-21 NOTE — Progress Notes (Signed)
NEUROLOGY FOLLOW UP OFFICE NOTE  [name] [id number]  HISTORY OF PRESENT ILLNESS: [name] is a 67 year old right-handed female with depression, anxiety and history of benzodiazepine and barbiturates dependence and gastric ulcer who follows up for migraine.  UPDATE: Since last visit, she has started Aimovig.  She said she took it once and it made her nauseous and constipated, so she didn't continue it.  She has decreased Depakote to 500mg  daily. Intensity:  Moderate to severe Duration:  2 to 3 hours Frequency:  daily Current NSAIDS:  contraindicated Current analgesics:  Tylenol, tramadol Current triptans:  no Current ergotamine:  no Current anti-emetic:  no Current muscle relaxants:  no Current anti-anxiolytic:  Librium Current sleep aide:  Librium Current Antihypertensive medications:  no Current Antidepressant medications:  no Current Anticonvulsant medications:  Depakote ER 500mg  daily Current anti-CGRP:  no Current Vitamins/Herbal/Supplements:  D Current Antihistamines/Decongestants:  no Other therapy:  no Hormone:estradiol  Caffeine:  1 cup 1/2 decaf coffee daily Alcohol:  no Smoker:  no Diet:  hydrates Exercise:  Stopped due to torn knee meniscus Depression:  yes; Anxiety:  Yes.  She is anxious about upcoming meniscus surgery. Other pain:  Knee pain Sleep hygiene:  poor  01/11/18 LABS:  CBC with WBC  5.5, HGB 13.2, HCT 38, PLT 171; CMP with Na 127, K 3.8, glucose 87, BUN 16, Cr 0.64, t bili 0.4, ALP 41, AST 12, ALT 5.  HISTORY: Onset:  2016.  She has remote history of migraines which were controlled for many years.  In December 2015, her husband unexpectedly passed away, which triggered depression and anxiety.  She subsequently recovered.  However, in early June 2016, she witnessed her boyfriend's ill father pass away.  It triggered flashbacks of her husband's death, which increased depression, anxiety and migraines.  She has significant history  of side effects to multiple medications. Location:  Left peri-orbital Quality:  pounding Initial Intensity:  10/10 Aura:  no Prodrome:  no Associated symptoms:  Nausea, photophobia, phonophobia, osmophobia Initial Duration:  All day Initial Frequency:  15 days per month Triggers/aggravating factors:  Emotional stress, light Relieving factors:  Tramadol Activity:  aggravates  Past NSAIDS:  Contraindicated due to bleeding ulcer Past analgesics:  Cannot take ASA products such as Excedrin due to bleeding ulcer Past abortive triptans:  Relpax (took it multiple times but caused chest tightness once, but was effective), sumatriptan (many years ago) Past anti-emetic:  Zofran 8mg  (effective) Past anti-anxiolytic:  benzodiazepines Past antihypertensive medications:  propranolol (chest tightness Past antidepressant medications:  Multiple, such as Cymbalta.  She has had intolerance to multiple antidepressants over the years. Past anticonvulsant medications:  topiramate (chest tightness) Past anti-CGRP:  Aimovig Past vitamins/Herbal/Supplements:  no  Family history of headache:  Mom, sister  MRI and MRA of head from 11/05/10 were personally reviewed and were unremarkable.  PAST MEDICAL HISTORY: Past Medical History:  Diagnosis Date  . Abnormal Pap smear of cervix 07/2016   CIN1  . Constipation   . Depression   . Diverticulosis   . H. pylori infection   . History of blood transfusion 1984  . Hx of colonic polyps   . Hypercholesteremia   . Hyponatremia   . Migraines   . Vitamin D deficiency     MEDICATIONS: Current Outpatient Medications on File Prior to Visit  Medication Sig Dispense Refill  . acetaminophen (TYLENOL) 325 MG tablet Take 325 mg by mouth every 6 (six) hours as needed.     .[name]  chlordiazePOXIDE (LIBRIUM) 10 MG capsule Take 20 mg by mouth at bedtime.     . cholecalciferol (VITAMIN D) 1000 units tablet Take 1,000 Units by mouth at bedtime.    . divalproex (DEPAKOTE ER)  250 MG 24 hr tablet TAKE 2 TABLETS AT BEDTIME. 60 tablet 5  . divalproex (DEPAKOTE ER) 250 MG 24 hr tablet Take 3 tablets (750 mg total) by mouth at bedtime. 90 tablet 3  . divalproex (DEPAKOTE ER) 250 MG 24 hr tablet Take 4 tablets (1,000 mg total) by mouth daily. 120 tablet 1  . divalproex (DEPAKOTE ER) 250 MG 24 hr tablet TAKE 4 TABLETS DAILY. 120 tablet 3  . docusate sodium (COLACE) 100 MG capsule Take 100 mg by mouth at bedtime.     Dorise Hiss (AIMOVIG 140 DOSE) 70 MG/ML SOAJ Inject 140 mg into the skin every 30 (thirty) days. 2 pen 11  . lansoprazole (PREVACID) 30 MG capsule Take 30 mg by mouth 2 (two) times daily before a meal.     . Omega-3 Fatty Acids (FISH OIL PO) Take 1 capsule by mouth daily.    . polyethylene glycol (MIRALAX / GLYCOLAX) packet Take 17 g by mouth at bedtime.     . predniSONE (STERAPRED UNI-PAK 21 TAB) 10 MG (21) TBPK tablet As directed (Patient not taking: Reported on 12/06/2017) 21 tablet 0  . Simethicone (PHAZYME MAXIMUM STRENGTH) 250 MG CAPS Take 250 mg by mouth daily as needed (gas).    . SUMAtriptan (IMITREX) 100 MG tablet Take 1 tablet.  May repeat once in 2 hours if headache persists or recurs. (Patient not taking: Reported on 08/09/2017) 10 tablet 2  . SUMAtriptan (IMITREX) 100 MG tablet Take 1 tablet (100 mg total) by mouth every 2 (two) hours as needed for migraine. May repeat in 2 hours if headache persists or recurs. No more than 2 doses in 24hrs. 10 tablet 3  . traMADol (ULTRAM) 50 MG tablet Take 50 mg by mouth daily.    [name] XIIDRA 5 % SOLN Place 1 drop into both eyes 2 (two) times daily.      No current facility-administered medications on file prior to visit.     ALLERGIES: Allergies  Allergen Reactions  . Aspirin Other (See Comments)    Hx of bleeding stomach ulcer   . Crestor [Rosuvastatin Calcium] Itching  . Inderal [Propranolol] Other (See Comments)    Chest tightness  . Other     Other reaction(s): Other (See Comments) Cholesterol  meds-multiple reactions to multiple meds  . Relpax [Eletriptan Hydrobromide] Other (See Comments)    Chest Tightness   . Salicylates Other (See Comments)    Told not to take due to hx of GI ulcer  . Topamax [Topiramate] Other (See Comments)    Chest tightness    FAMILY HISTORY: Family History  Problem Relation Age of Onset  . Migraines Mother   . Migraines Sister   . Cancer Father        Prostate    SOCIAL HISTORY: Social History   Socioeconomic History  . Marital status: Widowed    Spouse name: Not on file  . Number of children: Not on file  . Years of education: Not on file  . Highest education level: Not on file  Occupational History  . Occupation: LPN  Social Needs  . Financial resource strain: Not on file  . Food insecurity:    Worry: Not on file    Inability: Not on file  . Transportation needs:  Medical: Not on file    Non-medical: Not on file  Tobacco Use  . Smoking status: Never Smoker  . Smokeless tobacco: Never Used  Substance and Sexual Activity  . Alcohol use: No  . Drug use: No  . Sexual activity: Not on file    Comment: Hyst  Lifestyle  . Physical activity:    Days per week: Not on file    Minutes per session: Not on file  . Stress: Not on file  Relationships  . Social connections:    Talks on phone: Not on file    Gets together: Not on file    Attends religious service: Not on file    Active member of club or organization: Not on file    Attends meetings of clubs or organizations: Not on file    Relationship status: Not on file  . Intimate partner violence:    Fear of current or ex partner: Not on file    Emotionally abused: Not on file    Physically abused: Not on file    Forced sexual activity: Not on file  Other Topics Concern  . Not on file  Social History Narrative   Pt has L.P.N through [facility]    REVIEW OF SYSTEMS: Constitutional: No fevers, chills, or sweats, no generalized fatigue, change in appetite Eyes: No visual  changes, double vision, eye pain Ear, nose and throat: No hearing loss, ear pain, nasal congestion, sore throat Cardiovascular: No chest pain, palpitations Respiratory:  No shortness of breath at rest or with exertion, wheezes GastrointestinaI: No nausea, vomiting, diarrhea, abdominal pain, fecal incontinence Genitourinary:  No dysuria, urinary retention or frequency Musculoskeletal:  Left knee pain Integumentary: No rash, pruritus, skin lesions Neurological: as above Psychiatric: depression, insomnia, anxiety Endocrine: No palpitations, fatigue, diaphoresis, mood swings, change in appetite, change in weight, increased thirst Hematologic/Lymphatic:  No purpura, petechiae. Allergic/Immunologic: no itchy/runny eyes, nasal congestion, recent allergic reactions, rashes  PHYSICAL EXAM: Blood pressure 126/78, pulse 73, height 4\' 11"  (1.499 m), weight 137 lb (62.1 kg), last menstrual period 08/07/2001, SpO2 99 %. General: No acute distress.  Patient appears well-groomed.  normal body habitus. Head:  Normocephalic/atraumatic Eyes:  Fundi examined but not visualized Neck: supple, no paraspinal tenderness, full range of motion Heart:  Regular rate and rhythm Lungs:  Clear to auscultation bilaterally Back: No paraspinal tenderness Neurological Exam: alert and oriented to person, place, and time. Attention span and concentration intact, recent and remote memory intact, fund of knowledge intact.  Speech fluent and not dysarthric, language intact.  CN II-XII intact. Bulk and tone normal, muscle strength 5/5 throughout.  Sensation to light touch  intact.  Deep tendon reflexes 2+ throughout.  Finger to nose testing intact.  Gait normal, Romberg negative.  IMPRESSION: Chronic migraine without aura, without status migrainosus, not intractable.  She has had well over 3 consecutive months of 15 or more headache days and has failed or had side effects to multiple preventatives including Cymbalta, TCAs,  topiramate, propranolol, Depakote and Aimovig.  She would be a candidate for Botox. Depression and anxiety  PLAN: 1.  Submit for pre-authorization for Botox 2.  Continue Depakote ER 500mg  daily 3.  Limit pain relievers to no more than 2 days out of week to prevent rebound headache 4.  Keep headache diary 5.  Follow up for Botox   Shon MilletAdam Shyteria Lewis, DO  CC: Shaune Pollackonna Gates, MD

## 2018-03-22 ENCOUNTER — Telehealth: Payer: Self-pay | Admitting: Neurology

## 2018-03-22 ENCOUNTER — Ambulatory Visit: Payer: Medicare Other | Admitting: Neurology

## 2018-03-22 ENCOUNTER — Encounter: Payer: Self-pay | Admitting: Neurology

## 2018-03-22 VITALS — BP 126/78 | HR 73 | Ht 59.0 in | Wt 137.0 lb

## 2018-03-22 DIAGNOSIS — F419 Anxiety disorder, unspecified: Secondary | ICD-10-CM | POA: Diagnosis not present

## 2018-03-22 DIAGNOSIS — F329 Major depressive disorder, single episode, unspecified: Secondary | ICD-10-CM

## 2018-03-22 DIAGNOSIS — G43709 Chronic migraine without aura, not intractable, without status migrainosus: Secondary | ICD-10-CM | POA: Diagnosis not present

## 2018-03-22 DIAGNOSIS — F32A Depression, unspecified: Secondary | ICD-10-CM

## 2018-03-22 NOTE — Telephone Encounter (Signed)
Called Pt, LMOVM 

## 2018-03-22 NOTE — Patient Instructions (Signed)
1.  Continue Depakote ER 500mg  daily  2.  We will submit for preauthorization for Botox.  We will schedule you for appointment in September

## 2018-03-22 NOTE — Telephone Encounter (Signed)
[facility] called and [facility]. She did not give a reason. Please call her at [phone]. Thanks.

## 2018-03-26 NOTE — Telephone Encounter (Signed)
Called and spoke with Pt. She sent a my chart message to Dr Everlena CooperJaffe, and her question was answered concerning pain medication.

## 2018-04-03 NOTE — Progress Notes (Signed)
Initiated on cover my meds, rcvd Botox approval.  [id number]. BOTOX INJ 200UNIT is approved through 07/04/2018.  Called pt to see if she wanted to come 04/05/18 instead of 05/03/18. I had forgotten she was to have knee surgery, and is recovering. She will keep September appt.

## 2018-04-17 ENCOUNTER — Ambulatory Visit: Payer: Medicare Other | Admitting: Obstetrics and Gynecology

## 2018-05-03 ENCOUNTER — Ambulatory Visit (INDEPENDENT_AMBULATORY_CARE_PROVIDER_SITE_OTHER): Payer: Medicare Other | Admitting: Neurology

## 2018-05-03 DIAGNOSIS — G43709 Chronic migraine without aura, not intractable, without status migrainosus: Secondary | ICD-10-CM | POA: Diagnosis not present

## 2018-05-03 MED ORDER — ONABOTULINUMTOXINA 100 UNITS IJ SOLR
155.0000 [IU] | Freq: Once | INTRAMUSCULAR | Status: AC
Start: 2018-05-03 — End: 2018-05-03
  Administered 2018-05-03: 155 [IU] via INTRAMUSCULAR

## 2018-05-03 NOTE — Progress Notes (Signed)
[facility]   Procedure Note Botox  Attending: Dr. Everlena Cooper  Preoperative Diagnosis(es): Chronic migraine  Consent obtained from: The patient Benefits discussed included, but were not limited to decreased muscle tightness, increased joint range of motion, and decreased pain.  Risk discussed included, but were not limited pain and discomfort, bleeding, bruising, excessive weakness, venous thrombosis, muscle atrophy and dysphagia.  Anticipated outcomes of the procedure as well as he risks and benefits of the alternatives to the procedure, and the roles and tasks of the personnel to be involved, were discussed with the patient, and the patient consents to the procedure and agrees to proceed. A copy of the patient medication guide was given to the patient which explains the blackbox warning.  Patients identity and treatment sites confirmed Yes.  .  Details of Procedure: Skin was cleaned with alcohol. Prior to injection, the needle plunger was aspirated to make sure the needle was not within a blood vessel.  There was no blood retrieved on aspiration.    Following is a summary of the muscles injected  And the amount of Botulinum toxin used:  Dilution 200 units of Botox was reconstituted with 4 ml of preservative free normal saline. Time of reconstitution: At the time of the office visit (<30 minutes prior to injection)   Injections  155 total units of Botox was injected with a 30 gauge needle.  Injection Sites: L occipitalis: 15 units- 3 sites  R occiptalis: 15 units- 3 sites  L upper trapezius: 15 units- 3 sites R upper trapezius: 15 units- 3 sits          L paraspinal: 10 units- 2 sites R paraspinal: 10 units- 2 sites  Face L frontalis(2 injection sites):10 units   R frontalis(2 injection sites):10 units         L corrugator: 5 units   R corrugator: 5 units           Procerus: 5 units   L temporalis: 20 units R temporalis: 20 units   Agent:  200 units of botulinum Type A  (Onobotulinum Toxin type A) was reconstituted with 4 ml of preservative free normal saline.  Time of reconstitution: At the time of the office visit (<30 minutes prior to injection)     Total injected (Units): 155  Total wasted (Units): 10  Patient tolerated procedure well without complications.   Reinjection is anticipated in 3 months. Return to clinic in 4.5 months.

## 2018-05-20 ENCOUNTER — Ambulatory Visit (INDEPENDENT_AMBULATORY_CARE_PROVIDER_SITE_OTHER): Payer: Medicare Other | Admitting: Obstetrics and Gynecology

## 2018-05-20 ENCOUNTER — Encounter: Payer: Self-pay | Admitting: Obstetrics and Gynecology

## 2018-05-20 ENCOUNTER — Other Ambulatory Visit (HOSPITAL_COMMUNITY)
Admission: RE | Admit: 2018-05-20 | Discharge: 2018-05-20 | Disposition: A | Discharge status: Home / Self Care | Payer: Medicare Other | Source: Ambulatory Visit | Attending: Obstetrics and Gynecology | Admitting: Obstetrics and Gynecology

## 2018-05-20 ENCOUNTER — Other Ambulatory Visit: Payer: Self-pay

## 2018-05-20 VITALS — BP 138/80 | HR 86 | Ht 59.25 in | Wt 138.0 lb

## 2018-05-20 DIAGNOSIS — R350 Frequency of micturition: Secondary | ICD-10-CM

## 2018-05-20 DIAGNOSIS — Z01419 Encounter for gynecological examination (general) (routine) without abnormal findings: Secondary | ICD-10-CM | POA: Diagnosis not present

## 2018-05-20 LAB — POCT URINALYSIS DIPSTICK: Leukocytes, UA: NEGATIVE

## 2018-05-20 NOTE — Progress Notes (Signed)
67 y.o. G38P1002 Widowed Caucasian female here for annual exam.    Reporting urinary frequency and urgency.  No urinary incontinence.  Hx microscopic hematuria which is moderate.  She will see [facility] on 05/30/18.  Using vaginal Premarin cream 1 gram at hs once a week, and she reports vaginal dryness.   Good bowel control.  Patient concerned about HPV.  Desires testing.   Just had left knee arthroscopic surgery.   Doing Botox for migraine headaches.   PCP:  Shaune Pollack, MD    Patient's last menstrual period was 08/07/2001 (approximate).     Period Cycle (Days): (postmenopausal)     Sexually active: No.  The current method of family planning is post menopausal status/hysterectomy  Exercising: No.  The patient does not participate in regular exercise at present. Smoker:  no  Health Maintenance: Pap: 01-17-17 Neg, 07-17-16 LGSIL:Neg HR HPV.  Cervical pathology normal at time of hysterectomy on 04/24/17. 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 Colonoscopy:  Never.   She will see a provider at [facility].  BMD:   08/29/2017  Result  Osteopenic - hip and spine - PCP ordering.  TDaP:  unsure Gardasil:   no HIV: 01-16-17 NR Hep C: 01-17-17 Neg Screening Labs:   Urine today:     reports that she has never smoked. She has never used smokeless tobacco. She reports that she does not drink alcohol or use drugs.  Past Medical History:  Diagnosis Date  . Abnormal Pap smear of cervix 07/2016   CIN1  . Constipation   . Depression   . Diverticulosis   . H. pylori infection   . History of blood transfusion 1984  . Hx of colonic polyps   . Hypercholesteremia   . Hyponatremia   . Migraines   . Vitamin D deficiency     Past Surgical History:  Procedure Laterality Date  . ABDOMINAL HYSTERECTOMY    . ANTERIOR AND POSTERIOR REPAIR N/A 04/24/2017   Procedure: ANTERIOR (CYSTOCELE) AND POSTERIOR (RECTOCELE)  REPAIR;  Surgeon:  Patton Salles, MD;  Location: [facility];  Service: Gynecology;  Laterality: N/A;  . CESAREAN SECTION  1984  . COLONOSCOPY  2012  . COSMETIC SURGERY     Neck lift  . CYSTOSCOPY N/A 04/24/2017   Procedure: CYSTOSCOPY;  Surgeon: Patton Salles, MD;  Location: [facility];  Service: Gynecology;  Laterality: N/A;  . HEMORRHOID SURGERY  1984  . LAPAROSCOPIC LYSIS OF ADHESIONS N/A 04/24/2017   Procedure: EXTENSIVE LAPAROSCOPIC LYSIS OF ADHESIONS;  Surgeon: Patton Salles, MD;  Location: [facility];  Service: Gynecology;  Laterality: N/A;  . LAPAROSCOPIC VAGINAL HYSTERECTOMY WITH SALPINGO OOPHORECTOMY Bilateral 04/24/2017   Procedure: LAPAROSCOPIC ASSISTED VAGINAL HYSTERECTOMY WITH SALPINGO OOPHORECTOMY and pelvic washings;  Surgeon: Patton Salles, MD;  Location: [facility];  Service: Gynecology;  Laterality: Bilateral;  3 hours  . RECTAL PROLAPSE REPAIR     Dr. Kendrick Ranch  . UMBILICAL HERNIA REPAIR     had surgery as a child.    Current Outpatient Medications  Medication Sig Dispense Refill  . acetaminophen (TYLENOL) 325 MG tablet Take 325 mg by mouth every 6 (six) hours as needed.     . chlordiazePOXIDE (LIBRIUM) 10 MG capsule Take 20 mg by mouth at bedtime.     . cholecalciferol (VITAMIN D) 1000 units tablet Take 1,000 Units by mouth at bedtime.    [name]  divalproex (DEPAKOTE ER) 250 MG 24 hr tablet TAKE 2 TABLETS AT BEDTIME. 60 tablet 5  . docusate sodium (COLACE) 100 MG capsule Take 100 mg by mouth at bedtime.     . lansoprazole (PREVACID) 30 MG capsule Take 30 mg by mouth 2 (two) times daily before a meal.     . Omega-3 Fatty Acids (FISH OIL PO) Take 1 capsule by mouth daily.    . OnabotulinumtoxinA (BOTOX IJ)     . polyethylene glycol (MIRALAX / GLYCOLAX) packet Take 17 g by mouth at bedtime.     . Simethicone (PHAZYME MAXIMUM STRENGTH) 250 MG CAPS Take 250 mg by mouth daily as needed (gas).    . SUMAtriptan (IMITREX) 100 MG tablet Take 1 tablet (100 mg total) by  mouth every 2 (two) hours as needed for migraine. May repeat in 2 hours if headache persists or recurs. No more than 2 doses in 24hrs. 10 tablet 3  . XIIDRA 5 % SOLN Place 1 drop into both eyes 2 (two) times daily.      No current facility-administered medications for this visit.     Family History  Problem Relation Age of Onset  . Migraines Mother   . Migraines Sister   . Cancer Father        Prostate    Review of Systems  Constitutional: Negative.   HENT: Negative.   Eyes: Negative.   Respiratory: Negative.   Cardiovascular: Negative.   Gastrointestinal: Negative.   Endocrine: Negative.   Genitourinary: Positive for frequency.  Musculoskeletal: Negative.   Skin: Negative.   Allergic/Immunologic: Negative.   Neurological: Positive for headaches.  Hematological: Negative.   Psychiatric/Behavioral: Negative.   All other systems reviewed and are negative.   Exam:   BP 138/80   Pulse 86   Ht 4' 11.25" (1.505 m)   Wt 138 lb (62.6 kg)   LMP 08/07/2001 (Approximate)   SpO2 97%   BMI 27.64 kg/m     General appearance: alert, cooperative and appears stated age Head: Normocephalic, without obvious abnormality, atraumatic Neck: no adenopathy, supple, symmetrical, trachea midline and thyroid normal to inspection and palpation Lungs: clear to auscultation bilaterally Breasts: normal appearance, no masses or tenderness, No nipple retraction or dimpling, No nipple discharge or bleeding, No axillary or supraclavicular adenopathy Heart: regular rate and rhythm Abdomen: soft, non-tender; no masses, no organomegaly Extremities: extremities normal, atraumatic, no cyanosis or edema Skin: Skin color, texture, turgor normal. No rashes or lesions Lymph nodes: Cervical, supraclavicular, and axillary nodes normal. No abnormal inguinal nodes palpated Neurologic: Grossly normal  Pelvic: External genitalia:  no lesions              Urethra:  normal appearing urethra with no masses,  tenderness or lesions              Bartholins and Skenes: normal                 Vagina: normal appearing vagina with normal color and discharge, no lesions              Cervix:  Absent.              Pap taken: Yes.   Bimanual Exam:  Uterus:  Absent.               Adnexa: no mass, fullness, tenderness              Rectal exam: Yes.  .  Confirms.  Anus:  normal sphincter tone, no lesions  Chaperone was present for exam.  Assessment:   Well woman visit with normal exam. Status post LAVH/BSO/LOA/anterior and posterior colporrhaphy, cystoscopy. Hx LGSIL 2017. Migraine headaches and doing Botox injections.  Urinary urgency and microscopic hematuria.  Vaginal atrophy.  On Premarin vaginal cream.  Osteopenia.   Plan: Mammogram screening. Recommended self breast awareness. Pap and reflex HR HPV. Guidelines for Calcium, Vitamin D, regular exercise program including cardiovascular and weight bearing exercise. Change to Premarin vaginal cream 1/2 gram pv at hs 2 - 3 times per week.  I will take over the Rx now if she needs refills.  She will schedule a colonoscopy.  BMD in 2021.  Follow up annually and prn.   After visit summary provided.

## 2018-05-20 NOTE — Patient Instructions (Signed)

## 2018-05-21 LAB — CYTOLOGY - PAP: DIAGNOSIS: NEGATIVE

## 2018-05-28 ENCOUNTER — Ambulatory Visit: Payer: Medicare Other | Admitting: Neurology

## 2018-05-29 ENCOUNTER — Ambulatory Visit: Payer: Medicare Other | Admitting: Neurology

## 2018-06-05 DIAGNOSIS — M25662 Stiffness of left knee, not elsewhere classified: Secondary | ICD-10-CM | POA: Insufficient documentation

## 2018-06-05 DIAGNOSIS — M25562 Pain in left knee: Secondary | ICD-10-CM | POA: Insufficient documentation

## 2018-06-20 DIAGNOSIS — N329 Bladder disorder, unspecified: Secondary | ICD-10-CM | POA: Insufficient documentation

## 2018-07-25 ENCOUNTER — Encounter: Payer: Self-pay | Admitting: *Deleted

## 2018-07-25 NOTE — Progress Notes (Addendum)
Sent PA request via Cover my meds 07/25/2018 Key: Lillette [id number]  [id number] Received a fax dated 07/26/18 3:13PM requesting more info then another fax on Sunday 07/28/18 8:30AM stating it was denied due to not receiving the info. I returned the first fax 07/29/18 at 9:15 with the info requested and a note (fax#[phone]). I will reply to the other fax with an appeal if needed but for now will see if the first response suffices. I called the patient to let her know we are working on her PA and she said her HA frequency and intensity have improved until past two weeks but she googled it ans read that is normal.

## 2018-07-29 ENCOUNTER — Ambulatory Visit
Admission: RE | Admit: 2018-07-29 | Discharge: 2018-07-29 | Disposition: A | Discharge status: Home / Self Care | Payer: Medicare Other | Source: Ambulatory Visit | Attending: Family Medicine | Admitting: Family Medicine

## 2018-07-29 DIAGNOSIS — Z1231 Encounter for screening mammogram for malignant neoplasm of breast: Secondary | ICD-10-CM

## 2018-08-07 DIAGNOSIS — M1990 Unspecified osteoarthritis, unspecified site: Secondary | ICD-10-CM

## 2018-08-07 HISTORY — DX: Unspecified osteoarthritis, unspecified site: M19.90

## 2018-08-09 ENCOUNTER — Telehealth: Payer: Self-pay | Admitting: Neurology

## 2018-08-09 ENCOUNTER — Ambulatory Visit (INDEPENDENT_AMBULATORY_CARE_PROVIDER_SITE_OTHER): Payer: Medicare Other | Admitting: Neurology

## 2018-08-09 DIAGNOSIS — G43709 Chronic migraine without aura, not intractable, without status migrainosus: Secondary | ICD-10-CM | POA: Diagnosis not present

## 2018-08-09 MED ORDER — ONABOTULINUMTOXINA 100 UNITS IJ SOLR
155.0000 [IU] | Freq: Once | INTRAMUSCULAR | Status: AC
  Administered 2018-08-09: 155 [IU] via INTRAMUSCULAR

## 2018-08-09 NOTE — Telephone Encounter (Signed)
Patient called in stating that she did not get a F/U botox appt. When do I need to schedule her for? Please advise. Thanks!

## 2018-08-09 NOTE — Telephone Encounter (Signed)
Patient is scheduled   

## 2018-08-09 NOTE — Progress Notes (Signed)
[facility]   Procedure Note Botox  Attending: Dr. Colton Engdahl  Preoperative Diagnosis(es): Chronic migraine  Consent obtained from: The patient Benefits discussed included, but were not limited to decreased muscle tightness, increased joint range of motion, and decreased pain.  Risk discussed included, but were not limited pain and discomfort, bleeding, bruising, excessive weakness, venous thrombosis, muscle atrophy and dysphagia.  Anticipated outcomes of the procedure as well as he risks and benefits of the alternatives to the procedure, and the roles and tasks of the personnel to be involved, were discussed with the patient, and the patient consents to the procedure and agrees to proceed. A copy of the patient medication guide was given to the patient which explains the blackbox warning.  Patients identity and treatment sites confirmed Yes.  .  Details of Procedure: Skin was cleaned with alcohol. Prior to injection, the needle plunger was aspirated to make sure the needle was not within a blood vessel.  There was no blood retrieved on aspiration.    Following is a summary of the muscles injected  And the amount of Botulinum toxin used:  Dilution 200 units of Botox was reconstituted with 4 ml of preservative free normal saline. Time of reconstitution: At the time of the office visit (<30 minutes prior to injection)   Injections  155 total units of Botox was injected with a 30 gauge needle.  Injection Sites: L occipitalis: 15 units- 3 sites  R occiptalis: 15 units- 3 sites  L upper trapezius: 15 units- 3 sites R upper trapezius: 15 units- 3 sits          L paraspinal: 10 units- 2 sites R paraspinal: 10 units- 2 sites  Face L frontalis(2 injection sites):10 units   R frontalis(2 injection sites):10 units         L corrugator: 5 units   R corrugator: 5 units           Procerus: 5 units   L temporalis: 20 units R temporalis: 20 units   Agent:  200 units of botulinum Type A  (Onobotulinum Toxin type A) was reconstituted with 4 ml of preservative free normal saline.  Time of reconstitution: At the time of the office visit (<30 minutes prior to injection)     Total injected (Units): 155  Total wasted (Units): 7  Patient tolerated procedure well without complications.   Reinjection is anticipated in 3 months. Return to clinic in 6 weeks.   

## 2018-08-12 DIAGNOSIS — M25512 Pain in left shoulder: Secondary | ICD-10-CM | POA: Insufficient documentation

## 2018-08-12 DIAGNOSIS — M25511 Pain in right shoulder: Secondary | ICD-10-CM | POA: Insufficient documentation

## 2018-08-23 ENCOUNTER — Encounter: Payer: Self-pay | Admitting: Obstetrics and Gynecology

## 2018-08-23 ENCOUNTER — Telehealth: Payer: Self-pay | Admitting: Obstetrics and Gynecology

## 2018-08-23 NOTE — Telephone Encounter (Signed)
Message  To triage to assist with scheduling   Good afternoon Dr Edward Jolly, I have a discharge and burning it just started a couple of hours ago. Do you have time to see me today or can you call something in?   I realize it is late on Friday afternoon and Im sorry to do this but its getting very uncomfortable. Thank you [name]

## 2018-08-23 NOTE — Telephone Encounter (Signed)
Left message to call Hazaiah Edgecombe, RN at [facility] [phone].   

## 2018-08-26 ENCOUNTER — Encounter: Payer: Self-pay | Admitting: Obstetrics and Gynecology

## 2018-08-26 ENCOUNTER — Ambulatory Visit: Payer: Medicare Other | Admitting: Obstetrics and Gynecology

## 2018-08-26 VITALS — BP 116/68 | HR 84 | Temp 97.4°F | Resp 14 | Ht 59.25 in | Wt 141.0 lb

## 2018-08-26 DIAGNOSIS — N952 Postmenopausal atrophic vaginitis: Secondary | ICD-10-CM | POA: Diagnosis not present

## 2018-08-26 DIAGNOSIS — N760 Acute vaginitis: Secondary | ICD-10-CM

## 2018-08-26 MED ORDER — ESTROGENS, CONJUGATED 0.625 MG/GM VA CREA: 1.0000 | TOPICAL_CREAM | Freq: Every day | VAGINAL | 1 refills | Status: DC

## 2018-08-26 NOTE — Patient Instructions (Signed)
Vaginitis  Vaginitis is a condition in which the vaginal tissue swells and becomes red (inflamed). This condition is most often caused by a change in the normal balance of bacteria and yeast that live in the vagina. This change causes an overgrowth of certain bacteria or yeast, which causes the inflammation. There are different types of vaginitis, but the most common types are:   Bacterial vaginosis.   Yeast infection (candidiasis).   Trichomoniasis vaginitis. This is a sexually transmitted disease (STD).   Viral vaginitis.   Atrophic vaginitis.   Allergic vaginitis.  What are the causes?  The cause of this condition depends on the type of vaginitis. It can be caused by:   Bacteria (bacterial vaginosis).   Yeast, which is a fungus (yeast infection).   A parasite (trichomoniasis vaginitis).   A virus (viral vaginitis).   Low hormone levels (atrophic vaginitis). Low hormone levels can occur during pregnancy, breastfeeding, or after menopause.   Irritants, such as bubble baths, scented tampons, and feminine sprays (allergic vaginitis).  Other factors can change the normal balance of the yeast and bacteria that live in the vagina. These include:   Antibiotic medicines.   Poor hygiene.   Diaphragms, vaginal sponges, spermicides, birth control pills, and intrauterine devices (IUD).   Sex.   Infection.   Uncontrolled diabetes.   A weakened defense (immune) system.  What increases the risk?  This condition is more likely to develop in women who:   Smoke.   Use vaginal douches, scented tampons, or scented sanitary pads.   Wear tight-fitting pants.   Wear thong underwear.   Use oral birth control pills or an IUD.   Have sex without a condom.   Have multiple sex partners.   Have an STD.   Frequently use the spermicide nonoxynol-9.   Eat lots of foods high in sugar.   Have uncontrolled diabetes.   Have low estrogen levels.   Have a weakened immune system from an immune disorder or medical  treatment.   Are pregnant or breastfeeding.  What are the signs or symptoms?  Symptoms vary depending on the cause of the vaginitis. Common symptoms include:   Abnormal vaginal discharge.  ? The discharge is white, gray, or yellow with bacterial vaginosis.  ? The discharge is thick, white, and cheesy with a yeast infection.  ? The discharge is frothy and yellow or greenish with trichomoniasis.   A bad vaginal smell. The smell is fishy with bacterial vaginosis.   Vaginal itching, pain, or swelling.   Sex that is painful.   Pain or burning when urinating.  Sometimes there are no symptoms.  How is this diagnosed?  This condition is diagnosed based on your symptoms and medical history. A physical exam, including a pelvic exam, will also be done. You may also have other tests, including:   Tests to determine the pH level (acidity or alkalinity) of your vagina.   A whiff test, to assess the odor that results when a sample of your vaginal discharge is mixed with a potassium hydroxide solution.   Tests of vaginal fluid. A sample will be examined under a microscope.  How is this treated?  Treatment varies depending on the type of vaginitis you have. Your treatment may include:   Antibiotic creams or pills to treat bacterial vaginosis and trichomoniasis.   Antifungal medicines, such as vaginal creams or suppositories, to treat a yeast infection.   Medicine to ease discomfort if you have viral vaginitis. Your sexual partner   should also be treated.   Estrogen delivered in a cream, pill, suppository, or vaginal ring to treat atrophic vaginitis. If vaginal dryness occurs, lubricants and moisturizing creams may help. You may need to avoid scented soaps, sprays, or douches.   Stopping use of a product that is causing allergic vaginitis. Then using a vaginal cream to treat the symptoms.  Follow these instructions at home:  Lifestyle   Keep your genital area clean and dry. Avoid soap, and only rinse the area with  water.   Do not douche or use tampons until your health care provider says it is okay to do so. Use sanitary pads, if needed.   Do not have sex until your health care provider approves. When you can return to sex, practice safe sex and use condoms.   Wipe from front to back. This avoids the spread of bacteria from the rectum to the vagina.  General instructions   Take over-the-counter and prescription medicines only as told by your health care provider.   If you were prescribed an antibiotic medicine, take or use it as told by your health care provider. Do not stop taking or using the antibiotic even if you start to feel better.   Keep all follow-up visits as told by your health care provider. This is important.  How is this prevented?   Use mild, non-scented products. Do not use things that can irritate the vagina, such as fabric softeners. Avoid the following products if they are scented:  ? Feminine sprays.  ? Detergents.  ? Tampons.  ? Feminine hygiene products.  ? Soaps or bubble baths.   Let air reach your genital area.  ? Wear cotton underwear to reduce moisture buildup.  ? Avoid wearing underwear while you sleep.  ? Avoid wearing tight pants and underwear or nylons without a cotton panel.  ? Avoid wearing thong underwear.   Take off any wet clothing, such as bathing suits, as soon as possible.   Practice safe sex and use condoms.  Contact a health care provider if:   You have abdominal pain.   You have a fever.   You have symptoms that last for more than 2-3 days.  Get help right away if:   You have a fever and your symptoms suddenly get worse.  Summary   Vaginitis is a condition in which the vaginal tissue becomes inflamed.This condition is most often caused by a change in the normal balance of bacteria and yeast that live in the vagina.   Treatment varies depending on the type of vaginitis you have.   Do not douche, use tampons , or have sex until your health care provider approves. When  you can return to sex, practice safe sex and use condoms.  This information is not intended to replace advice given to you by your health care provider. Make sure you discuss any questions you have with your health care provider.  Document Released: 05/21/2007 Document Revised: 08/29/2016 Document Reviewed: 08/29/2016  Elsevier Interactive Patient Education  2019 [employer]

## 2018-08-26 NOTE — Progress Notes (Signed)
GYNECOLOGY  VISIT   HPI: 68 y.o.   Widowed  Caucasian  female   G2P1002 with Patient's last menstrual period was 08/07/2001 (approximate).   here for white vaginal discharge, vulvar and vaginal itching, odor, and vaginal dryness. Per patient, has done Monistat for 3 days and has had no relief. Redness on the vulva.  Notes odor.  Last dosage was yesterday.  States that Diflucan helps her best.   Using Premarin cream,0.5 mg, three times a week.  Last dosage was about 5 days ago.   No recent abx or steroids.   Uses Dove soap.   GYNECOLOGIC HISTORY: Patient's last menstrual period was 08/07/2001 (approximate). Contraception: Postmenopausal/Hysterectomy Menopausal hormone therapy: none Last mammogram: 07-29-18 Neg/density B/BiRads1 Last pap smear: 05-20-18 Neg, 01-17-17 Neg        OB History    Gravida  2   Para  2   Term  1   Preterm      AB      Living  2     SAB      TAB      Ectopic      Multiple      Live Births  2              Patient Active Problem List   Diagnosis Date Noted  . Status post laparoscopy-assisted vaginal hysterectomy 04/24/2017  . Dry eye syndrome 03/17/2016  . Headache 03/17/2016  . Hyperlipidemia 03/17/2016  . Lack of bladder control 03/17/2016  . Migraines 03/17/2016  . 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 Abnormal Pap smear of cervix 07/2016   CIN1  . Constipation   . Depression   . Diverticulosis   . H. pylori infection   . History of blood transfusion 1984  . Hx of colonic polyps   . Hypercholesteremia   . Hyponatremia   . Migraines   . Vitamin D deficiency     Past Surgical History:  Procedure Laterality Date  . ABDOMINAL HYSTERECTOMY    . ANTERIOR AND POSTERIOR REPAIR N/A 04/24/2017   Procedure: ANTERIOR (CYSTOCELE) AND POSTERIOR (RECTOCELE)  REPAIR;  Surgeon: Patton SallesAmundson C Silva, Joleene Burnham E, MD;  Location: [facility];  Service:  Gynecology;  Laterality: N/A;  . CESAREAN SECTION  1984  . COLONOSCOPY  2012  . COSMETIC SURGERY     Neck lift  . CYSTOSCOPY N/A 04/24/2017   Procedure: CYSTOSCOPY;  Surgeon: Patton SallesAmundson C Silva, Kenyatte Chatmon E, MD;  Location: [facility];  Service: Gynecology;  Laterality: N/A;  . HEMORRHOID SURGERY  1984  . LAPAROSCOPIC LYSIS OF ADHESIONS N/A 04/24/2017   Procedure: EXTENSIVE LAPAROSCOPIC LYSIS OF ADHESIONS;  Surgeon: Patton SallesAmundson C Silva, Marvalene Barrett E, MD;  Location: [facility];  Service: Gynecology;  Laterality: N/A;  . LAPAROSCOPIC VAGINAL HYSTERECTOMY WITH SALPINGO OOPHORECTOMY Bilateral 04/24/2017   Procedure: LAPAROSCOPIC ASSISTED VAGINAL HYSTERECTOMY WITH SALPINGO OOPHORECTOMY and pelvic washings;  Surgeon: Patton SallesAmundson C Silva, Jiovanni Heeter E, MD;  Location: [facility];  Service: Gynecology;  Laterality: Bilateral;  3 hours  . RECTAL PROLAPSE REPAIR     Dr. Kendrick Ranchim Davis  . UMBILICAL HERNIA REPAIR     had surgery as a child.    Current Outpatient Medications  Medication Sig Dispense Refill  . acetaminophen (TYLENOL) 325 MG tablet Take 325 mg by mouth every 6 (six) hours as needed.     . chlordiazePOXIDE (LIBRIUM) 10 MG capsule Take 20 mg by mouth at  bedtime.     . cholecalciferol (VITAMIN D) 1000 units tablet Take 1,000 Units by mouth at bedtime.    . divalproex (DEPAKOTE ER) 250 MG 24 hr tablet TAKE 2 TABLETS AT BEDTIME. 60 tablet 5  . docusate sodium (COLACE) 100 MG capsule Take 100 mg by mouth at bedtime.     . lansoprazole (PREVACID) 30 MG capsule Take 30 mg by mouth 2 (two) times daily before a meal.     . Omega-3 Fatty Acids (FISH OIL PO) Take 1 capsule by mouth daily.    . OnabotulinumtoxinA (BOTOX IJ)     . polyethylene glycol (MIRALAX / GLYCOLAX) packet Take 17 g by mouth at bedtime.     . Simethicone (PHAZYME MAXIMUM STRENGTH) 250 MG CAPS Take 250 mg by mouth daily as needed (gas).    [name] XIIDRA 5 % SOLN Place 1 drop into both eyes 2 (two) times daily.      No current facility-administered medications for this  visit.      ALLERGIES: Aspirin; Crestor [rosuvastatin calcium]; Inderal [propranolol]; Macrobid [nitrofurantoin monohyd macro]; Other; Relpax [eletriptan hydrobromide]; Salicylates; and Topamax [topiramate]  Family History  Problem Relation Age of Onset  . Migraines Mother   . Migraines Sister   . Cancer Father        Prostate    Social History   Socioeconomic History  . Marital status: Widowed    Spouse name: Not on file  . Number of children: Not on file  . Years of education: Not on file  . Highest education level: Not on file  Occupational History  . Occupation: LPN  Social Needs  . Financial resource strain: Not on file  . Food insecurity:    Worry: Not on file    Inability: Not on file  . Transportation needs:    Medical: Not on file    Non-medical: Not on file  Tobacco Use  . Smoking status: Never Smoker  . Smokeless tobacco: Never Used  Substance and Sexual Activity  . Alcohol use: No  . Drug use: No  . Sexual activity: Not Currently    Birth control/protection: Post-menopausal, Surgical    Comment: Hyst  Lifestyle  . Physical activity:    Days per week: Not on file    Minutes per session: Not on file  . Stress: Not on file  Relationships  . Social connections:    Talks on phone: Not on file    Gets together: Not on file    Attends religious service: Not on file    Active member of club or organization: Not on file    Attends meetings of clubs or organizations: Not on file    Relationship status: Not on file  . Intimate partner violence:    Fear of current or ex partner: Not on file    Emotionally abused: Not on file    Physically abused: Not on file    Forced sexual activity: Not on file  Other Topics Concern  . Not on file  Social History Narrative   Pt has L.P.N through [facility]    Review of Systems  Constitutional: Negative.   HENT: Negative.   Eyes: Negative.   Respiratory: Negative.   Cardiovascular: Negative.   Gastrointestinal:  Negative.   Endocrine: Negative.   Genitourinary: Positive for vaginal discharge.       Vulvar and vaginal itching Vaginal dryness Odor  Musculoskeletal: Negative.   Skin: Negative.   Allergic/Immunologic: Negative.   Neurological: Negative.  Hematological: Negative.   Psychiatric/Behavioral: Negative.     PHYSICAL EXAMINATION:    BP 116/68 (BP Location: Right Arm, Patient Position: Sitting, Cuff Size: Normal)   Pulse 84   Temp (!) 97.4 F (36.3 C) (Oral)   Resp 14   Ht 4' 11.25" (1.505 m)   Wt 141 lb (64 kg)   LMP 08/07/2001 (Approximate)   BMI 28.24 kg/m     General appearance: alert, cooperative and appears stated age  Pelvic: External genitalia:  no lesions              Urethra:  normal appearing urethra with no masses, tenderness or lesions              Bartholins and Skenes: normal                 Vagina: normal appearing vagina with normal color and discharge, no lesions              Cervix:  Absent.                Bimanual Exam:  Uterus:   Absent.               Adnexa: no mass, fullness, tenderness                Chaperone was present for exam.  ASSESSMENT  Vaginitis.  Hx vaginal atrophy.   PLAN  Affirm testing.  Will wait for results to treat.  Stop Monistat.  Stop vaginal estrogen cream until vaginitis is treated.  Refills of Premarin cream.   An After Visit Summary was printed and given to the patient.  __15____ minutes face to face time of which over 50% was spent in counseling.

## 2018-08-26 NOTE — Telephone Encounter (Signed)
Per review of Epic, patient is scheduled for OV on 08/26/18 at 3:45 with Dr. Edward JollySilva.   Encounter closed.

## 2018-08-27 ENCOUNTER — Other Ambulatory Visit: Payer: Self-pay | Admitting: *Deleted

## 2018-08-27 LAB — VAGINITIS/VAGINOSIS, DNA PROBE
Candida Species: NEGATIVE
Gardnerella vaginalis: NEGATIVE
TRICHOMONAS VAG: NEGATIVE

## 2018-08-27 MED ORDER — FLUCONAZOLE 150 MG PO TABS: ORAL_TABLET | ORAL | 0 refills | Status: DC

## 2018-08-28 ENCOUNTER — Encounter: Payer: Self-pay | Admitting: Obstetrics and Gynecology

## 2018-08-28 ENCOUNTER — Telehealth: Payer: Self-pay | Admitting: Obstetrics and Gynecology

## 2018-08-28 NOTE — Telephone Encounter (Signed)
Message left to return call to Shawnetta Lein at [phone].    

## 2018-08-28 NOTE — Telephone Encounter (Signed)
I agree with your recommendations.  Encounter closed. 

## 2018-08-28 NOTE — Telephone Encounter (Signed)
Patient sent the following correspondence through MyChart. Routing to triage to assist patient with request.  Good afternoon Dr Edward Jolly,  I started back on the Premarin .5 last night. This morning I have a significant amount of white discharge. Once [facility] delivers the Diflucan I will take it. With this discharge do you want me to continue with the Premarin?  I am sorry to keep sending these questions but Im anxious about the weekend, and the possibility of a yeast infection.  Thanks,  [name]

## 2018-08-28 NOTE — Telephone Encounter (Signed)
Patient returned call.  She is advised that Dr. Edward Jolly wanted her to stop use of Premarin until yeast was cleared and patient felt improved.  Patient took diflucan today, will take another on Saturday if necessary.  Instructions given to call back on Monday and give update on how she is feeling.  If improved, will find out from Dr Edward Jolly if okay to restart Premarin, if not feeling improved, will need follow up visit for recheck. Patient agreeable.  Will call back on Monday and given update.   Okay to close?

## 2018-08-29 ENCOUNTER — Telehealth: Payer: Self-pay | Admitting: Obstetrics and Gynecology

## 2018-08-29 NOTE — Telephone Encounter (Signed)
Patient has a question about taking premarin.

## 2018-08-29 NOTE — Progress Notes (Signed)
GYNECOLOGY  VISIT   HPI: 68 y.o.   Widowed  Caucasian  female   G2P1002 with Patient's last menstrual period was 08/07/2001 (approximate).   here for vaginitis recheck. Patient's vaginal itching/irritation is much better, but still with vaginal dryness.  No current itching, burning, or discharge.   Patient had negative Affirm testing but had recently used OTC Monistat cream.  She was treated with Diflucan at her office visit.   She has vaginal estrogen cream 1/2 gram, using three times a week.  Her symptoms are more external.   GYNECOLOGIC HISTORY: Patient's last menstrual period was 08/07/2001 (approximate). Contraception: Postmenopausal/hysterectomy Menopausal hormone therapy:  none Last mammogram:   07-29-18 Neg/density B/BiRads1 Last pap smear: 05-20-18 Neg, 01-17-17 Neg        OB History    Gravida  2   Para  2   Term  1   Preterm      AB      Living  2     SAB      TAB      Ectopic      Multiple      Live Births  2              Patient Active Problem List   Diagnosis Date Noted  . Status post laparoscopy-assisted vaginal hysterectomy 04/24/2017  . Dry eye syndrome 03/17/2016  . Headache 03/17/2016  . Hyperlipidemia 03/17/2016  . Lack of bladder control 03/17/2016  . Migraines 03/17/2016  . 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 Abnormal Pap smear of cervix 07/2016   CIN1  . Constipation   . Depression   . Diverticulosis   . H. pylori infection   . History of blood transfusion 1984  . Hx of colonic polyps   . Hypercholesteremia   . Hyponatremia   . Migraines   . Vitamin D deficiency     Past Surgical History:  Procedure Laterality Date  . ABDOMINAL HYSTERECTOMY    . ANTERIOR AND POSTERIOR REPAIR N/A 04/24/2017   Procedure: ANTERIOR (CYSTOCELE) AND POSTERIOR (RECTOCELE)  REPAIR;  Surgeon: Patton Salles, MD;  Location: [facility];  Service:  Gynecology;  Laterality: N/A;  . CESAREAN SECTION  1984  . COLONOSCOPY  2012  . COSMETIC SURGERY     Neck lift  . CYSTOSCOPY N/A 04/24/2017   Procedure: CYSTOSCOPY;  Surgeon: Patton Salles, MD;  Location: [facility];  Service: Gynecology;  Laterality: N/A;  . HEMORRHOID SURGERY  1984  . LAPAROSCOPIC LYSIS OF ADHESIONS N/A 04/24/2017   Procedure: EXTENSIVE LAPAROSCOPIC LYSIS OF ADHESIONS;  Surgeon: Patton Salles, MD;  Location: [facility];  Service: Gynecology;  Laterality: N/A;  . LAPAROSCOPIC VAGINAL HYSTERECTOMY WITH SALPINGO OOPHORECTOMY Bilateral 04/24/2017   Procedure: LAPAROSCOPIC ASSISTED VAGINAL HYSTERECTOMY WITH SALPINGO OOPHORECTOMY and pelvic washings;  Surgeon: Patton Salles, MD;  Location: [facility];  Service: Gynecology;  Laterality: Bilateral;  3 hours  . RECTAL PROLAPSE REPAIR     Dr. Kendrick Ranch  . UMBILICAL HERNIA REPAIR     had surgery as a child.    Current Outpatient Medications  Medication Sig Dispense Refill  . acetaminophen (TYLENOL) 325 MG tablet Take 325 mg by mouth every 6 (six) hours as needed.     . chlordiazePOXIDE (LIBRIUM) 10 MG capsule Take 20 mg by mouth at bedtime.     [name]  cholecalciferol (VITAMIN D) 1000 units tablet Take 1,000 Units by mouth at bedtime.    . divalproex (DEPAKOTE ER) 250 MG 24 hr tablet TAKE 2 TABLETS AT BEDTIME. 60 tablet 5  . docusate sodium (COLACE) 100 MG capsule Take 100 mg by mouth at bedtime.     . lansoprazole (PREVACID) 30 MG capsule Take 30 mg by mouth 2 (two) times daily before a meal.     . Omega-3 Fatty Acids (FISH OIL PO) Take 1 capsule by mouth daily.    . OnabotulinumtoxinA (BOTOX IJ)     . polyethylene glycol (MIRALAX / GLYCOLAX) packet Take 17 g by mouth at bedtime.     . Simethicone (PHAZYME MAXIMUM STRENGTH) 250 MG CAPS Take 250 mg by mouth daily as needed (gas).    [name]. XIIDRA 5 % SOLN Place 1 drop into both eyes 2 (two) times daily.     [name]. conjugated estrogens (PREMARIN) vaginal cream Place 1  Applicatorful vaginally daily. Use 1/2 g vaginally two or three times per week as needed to maintain symptom relief. (Patient not taking: Reported on 09/02/2018) 30 g 1   No current facility-administered medications for this visit.      ALLERGIES: Aspirin; Crestor [rosuvastatin calcium]; Inderal [propranolol]; Macrobid [nitrofurantoin monohyd macro]; Other; Relpax [eletriptan hydrobromide]; Salicylates; and Topamax [topiramate]  Family History  Problem Relation Age of Onset  . Migraines Mother   . Migraines Sister   . Cancer Father        Prostate    Social History   Socioeconomic History  . Marital status: Widowed    Spouse name: Not on file  . Number of children: Not on file  . Years of education: Not on file  . Highest education level: Not on file  Occupational History  . Occupation: LPN  Social Needs  . Financial resource strain: Not on file  . Food insecurity:    Worry: Not on file    Inability: Not on file  . Transportation needs:    Medical: Not on file    Non-medical: Not on file  Tobacco Use  . Smoking status: Never Smoker  . Smokeless tobacco: Never Used  Substance and Sexual Activity  . Alcohol use: No  . Drug use: No  . Sexual activity: Not Currently    Birth control/protection: Post-menopausal, Surgical    Comment: Hyst  Lifestyle  . Physical activity:    Days per week: Not on file    Minutes per session: Not on file  . Stress: Not on file  Relationships  . Social connections:    Talks on phone: Not on file    Gets together: Not on file    Attends religious service: Not on file    Active member of club or organization: Not on file    Attends meetings of clubs or organizations: Not on file    Relationship status: Not on file  . Intimate partner violence:    Fear of current or ex partner: Not on file    Emotionally abused: Not on file    Physically abused: Not on file    Forced sexual activity: Not on file  Other Topics Concern  . Not on file   Social History Narrative   [facility]    Review of Systems  All other systems reviewed and are negative.   PHYSICAL EXAMINATION:    BP 110/60 (BP Location: Right Arm, Patient Position: Sitting, Cuff Size: Normal)   Pulse 76  Ht 4' 11.25" (1.505 m)   Wt 140 lb (63.5 kg)   LMP 08/07/2001 (Approximate)   BMI 28.04 kg/m     General appearance: alert, cooperative and appears stated age   Pelvic: External genitalia:  no lesions              Urethra:  normal appearing urethra with no masses, tenderness or lesions              Bartholins and Skenes: normal                 Vagina: normal appearing vagina with normal color and discharge, no lesions              Cervix: absent                Bimanual Exam:  Uterus:  Absent              Adnexa: no mass, fullness, tenderness       Chaperone was present for exam.  ASSESSMENT  Vulvovaginal atrophy.  Recent yeast infection.   PLAN  Use Premarin vaginal cream 1/2 gram pv three times weekly.  Use a little externally on the vulva at the same time.  She will call in the future for vaginal discharge accompanied by itching, burning, or odor.   Fu prn.   An After Visit Summary was printed and given to the patient.  __15____ minutes face to face time of which over 50% was spent in counseling.

## 2018-08-29 NOTE — Telephone Encounter (Signed)
Spoke with patient.  She is calling with questions and wants to ensure she is following directions from Dr. Edward Jolly.  Again discussed with her directions, no creams or Premarin until she is seen by Dr. Edward Jolly again.  Bring Premarin with her to appointment.  Patient was able to repeat back instructions.  Advised to call back with any further questions.  Update to Dr. Edward Jolly.  Encounter closed.

## 2018-09-02 ENCOUNTER — Ambulatory Visit: Payer: Medicare Other | Admitting: Obstetrics and Gynecology

## 2018-09-02 ENCOUNTER — Other Ambulatory Visit: Payer: Self-pay

## 2018-09-02 ENCOUNTER — Encounter: Payer: Self-pay | Admitting: Obstetrics and Gynecology

## 2018-09-02 VITALS — BP 110/60 | HR 76 | Ht 59.25 in | Wt 140.0 lb

## 2018-09-02 DIAGNOSIS — N952 Postmenopausal atrophic vaginitis: Secondary | ICD-10-CM | POA: Diagnosis not present

## 2018-09-04 ENCOUNTER — Telehealth: Payer: Self-pay | Admitting: Obstetrics and Gynecology

## 2018-09-04 NOTE — Telephone Encounter (Signed)
Patient calling to let Dr. Edward Jolly know she started Premarin internally again last night. This morning when she woke up, she had white discharges she believes to be a yeast infection. Patient has one diflucan left that she was saving for the weekend. States she is sick with a cold and cannot come in.

## 2018-09-04 NOTE — Telephone Encounter (Signed)
Spoke with patient. Patient restarted premarin vaginal cream last night, woke up this morning with white, thin, vaginal d/c with odor. Denies fishy or foul odor. Initially though it was the premarin cream but does not feel like it is the cream. Denies itching or burning. Has "cold symptoms" denies OV. Patient states she thinks she used a vaginal tablet prescribed by Dr. Kevan Ny years ago for vaginal dryness, would this be recommended? She states she also has one diflucan and is asking if she should take this now?   Advised I will review with Dr. Edward Jolly and return call with recommendations. Patient agreeable.  Dr. Edward Jolly -please advise.

## 2018-09-04 NOTE — Telephone Encounter (Signed)
I do not recommend she self treat with Diflucan.  Please tell her to not use over the counter medications either.  The Premarin may be causing the discharge.  I recommend she stop the Premarin for now.  I do not recommend the vaginal estrogen tablets until we know if she is having an infection.     If her symptoms persist, office visit.   She just had negative vaginitis screening.

## 2018-09-05 NOTE — Telephone Encounter (Signed)
Spoke with patient, advised as seen below per Dr. Edward Jolly. Patient read back instructions. Patient reports no vaginal discharge today. Patient verbalizes understanding.   Routing to provider for final review. Patient is agreeable to disposition. Will close encounter.

## 2018-09-09 ENCOUNTER — Telehealth: Payer: Self-pay | Admitting: Obstetrics and Gynecology

## 2018-09-09 NOTE — Telephone Encounter (Signed)
Patient may want to try a cooking oil such as olive oil, canola oil, or coconut oil.

## 2018-09-09 NOTE — Telephone Encounter (Signed)
Patient says she no longer has the white discharge but wondering what she can take for external dryness.

## 2018-09-09 NOTE — Telephone Encounter (Signed)
Spoke with patient.  She states she feels improved and feels like she is no longer having white vaginal discharge. Not using premarin cream per instructions.   C/o external vaginal dryness that she states "is uncomfortable." No itching.   Wants to know if there is something Dr. Edward JollySilva recommends for vaginal dryness.  She states she has used replens before and that only helps temporarily.   Declines office visit as she is currently having cold symptoms.   Advised would return call with response.

## 2018-09-10 NOTE — Telephone Encounter (Signed)
Spoke with patient.  Message from Dr. Edward Jolly given.  She is given instructions on how to use cooking oils vaginally.   She will call back with any questions or concerns.  Encounter closed.

## 2018-09-16 NOTE — Progress Notes (Signed)
NEUROLOGY FOLLOW UP OFFICE NOTE  Whitney BellowsChristine H Peterson [id number]  HISTORY OF PRESENT ILLNESS: Whitney SalinasChristine Peterson is a 68 year old right-handed Caucasian woman with depression, anxiety and history of benzodiazepine and barbiturates dependence and gastric ulcer who follows up for migraine.  UPDATE: She is status post 2 rounds of Botox. Intensity:  Mild (one was moderate to severe) Duration:  3 to 4 hours (has been using ice rather than taking Tylenol (trying to avoid using it) Frequency:  Over past 30 days, 4 headaches (1 was moderate-severe) Current NSAIDS:  contraindicated Current analgesics:  Tylenol Current triptans:  no Current ergotamine:  no Current anti-emetic:  no Current muscle relaxants:  no Current anti-anxiolytic:  Librium Current sleep aide:  Librium Current Antihypertensive medications:  no Current Antidepressant medications:  no Current Anticonvulsant medications:  Depakote ER 500mg  daily Current anti-CGRP:  no Current Vitamins/Herbal/Supplements:  D Current Antihistamines/Decongestants:  no Other therapy:   Botox Hormone:estradiol  Caffeine:  1 cup 1/2 decaf coffee daily Alcohol:  no Smoker:  no Diet:  hydrates Exercise:  Stopped due to torn knee meniscus Depression:  no; Anxiety:  yes. Other pain:  Knee pain Sleep hygiene:  poor  HISTORY:  Onset: 2016.  She has remote history of migraines which were controlled for many years. In December 2015, her husband unexpectedly passed away, which triggered depression and anxiety. She subsequently recovered. However, in early June 2016, she witnessed her boyfriend's ill father pass away. It triggered flashbacks of her husband's death, which increased depression, anxiety and migraines. She has significant history of side effects to multiple medications. Location: Left peri-orbital Quality: pounding Initial Intensity: 10/10 Aura: no Prodrome: no Associated symptoms: Nausea, photophobia, phonophobia,  osmophobia Initial Duration: All day Initial Frequency: 15 days per month Triggers: Emotional stress Relieving factors: Tramadol Activity: aggravates  Past NSAIDS: Contraindicated due to bleeding ulcer Past analgesics: Cannot take ASA products such as Excedrin due to bleeding ulcer Past abortive triptans: Relpax (took it multiple times but caused chest tightness once, but was effective), sumatriptan (many years ago) Past anti-emetic: Zofran 8mg  (effective) Past anti-anxiolytic: benzodiazepines Past antihypertensive medications: propranolol (chest tightness Past antidepressant medications: Multiple, such as Cymbalta. She has had intolerance to multiple antidepressants over the years. Past anticonvulsant medications: topiramate (chest tightness) Past anti-CGRP:  Aimovig (constipation, nausea) Past vitamins/Herbal/Supplements: no  Family history of headache: Mom, sister  MRI and MRA of head from 11/05/10 were personally reviewed and were unremarkable.  PAST MEDICAL HISTORY: Past Medical History:  Diagnosis Date  . Abnormal Pap smear of cervix 07/2016   CIN1  . Constipation   . Depression   . Diverticulosis   . H. pylori infection   . History of blood transfusion 1984  . Hx of colonic polyps   . Hypercholesteremia   . Hyponatremia   . Migraines   . Vitamin D deficiency     MEDICATIONS: Current Outpatient Medications on File Prior to Visit  Medication Sig Dispense Refill  . acetaminophen (TYLENOL) 325 MG tablet Take 325 mg by mouth every 6 (six) hours as needed.     . chlordiazePOXIDE (LIBRIUM) 10 MG capsule Take 20 mg by mouth at bedtime.     . cholecalciferol (VITAMIN D) 1000 units tablet Take 1,000 Units by mouth at bedtime.    . conjugated estrogens (PREMARIN) vaginal cream Place 1 Applicatorful vaginally daily. Use 1/2 g vaginally two or three times per week as needed to maintain symptom relief. (Patient not taking: Reported on 09/02/2018) 30 g 1  .  divalproex (  DEPAKOTE ER) 250 MG 24 hr tablet TAKE 2 TABLETS AT BEDTIME. 60 tablet 5  . docusate sodium (COLACE) 100 MG capsule Take 100 mg by mouth at bedtime.     . lansoprazole (PREVACID) 30 MG capsule Take 30 mg by mouth 2 (two) times daily before a meal.     . Omega-3 Fatty Acids (FISH OIL PO) Take 1 capsule by mouth daily.    . OnabotulinumtoxinA (BOTOX IJ)     . polyethylene glycol (MIRALAX / GLYCOLAX) packet Take 17 g by mouth at bedtime.     . Simethicone (PHAZYME MAXIMUM STRENGTH) 250 MG CAPS Take 250 mg by mouth daily as needed (gas).    [name] XIIDRA 5 % SOLN Place 1 drop into both eyes 2 (two) times daily.      No current facility-administered medications on file prior to visit.     ALLERGIES: Allergies  Allergen Reactions  . Aspirin Other (See Comments)    Hx of bleeding stomach ulcer   . Crestor [Rosuvastatin Calcium] Itching  . Inderal [Propranolol] Other (See Comments)    Chest tightness  . Macrobid [Nitrofurantoin Monohyd Macro] Diarrhea  . Other     Other reaction(s): Other (See Comments) Cholesterol meds-multiple reactions to multiple meds  . Relpax [Eletriptan Hydrobromide] Other (See Comments)    Chest Tightness   . Salicylates Other (See Comments)    Told not to take due to hx of GI ulcer  . Topamax [Topiramate] Other (See Comments)    Chest tightness    FAMILY HISTORY: Family History  Problem Relation Age of Onset  . Migraines Mother   . Migraines Sister   . Cancer Father        Prostate   SOCIAL HISTORY: Social History   Socioeconomic History  . Marital status: Widowed    Spouse name: Not on file  . Number of children: Not on file  . Years of education: Not on file  . Highest education level: Not on file  Occupational History  . Occupation: LPN  Social Needs  . Financial resource strain: Not on file  . Food insecurity:    Worry: Not on file    Inability: Not on file  . Transportation needs:    Medical: Not on file    Non-medical: Not on  file  Tobacco Use  . Smoking status: Never Smoker  . Smokeless tobacco: Never Used  Substance and Sexual Activity  . Alcohol use: No  . Drug use: No  . Sexual activity: Not Currently    Birth control/protection: Post-menopausal, Surgical    Comment: Hyst  Lifestyle  . Physical activity:    Days per week: Not on file    Minutes per session: Not on file  . Stress: Not on file  Relationships  . Social connections:    Talks on phone: Not on file    Gets together: Not on file    Attends religious service: Not on file    Active member of club or organization: Not on file    Attends meetings of clubs or organizations: Not on file    Relationship status: Not on file  . Intimate partner violence:    Fear of current or ex partner: Not on file    Emotionally abused: Not on file    Physically abused: Not on file    Forced sexual activity: Not on file  Other Topics Concern  . Not on file  Social History Narrative   Pt has L.P.N through [facility]  REVIEW OF SYSTEMS: Constitutional: No fevers, chills, or sweats, no generalized fatigue, change in appetite Eyes: No visual changes, double vision, eye pain Ear, nose and throat: No hearing loss, ear pain, nasal congestion, sore throat Cardiovascular: No chest pain, palpitations Respiratory:  No shortness of breath at rest or with exertion, wheezes GastrointestinaI: No nausea, vomiting, diarrhea, abdominal pain, fecal incontinence Genitourinary:  No dysuria, urinary retention or frequency Musculoskeletal:  No neck pain, back pain Integumentary: No rash, pruritus, skin lesions Neurological: as above Psychiatric: insomnia, anxiety Endocrine: No palpitations, fatigue, diaphoresis, mood swings, change in appetite, change in weight, increased thirst Hematologic/Lymphatic:  No purpura, petechiae. Allergic/Immunologic: no itchy/runny eyes, nasal congestion, recent allergic reactions, rashes  PHYSICAL EXAM: Blood pressure 122/66, pulse 72, height  4' 11.25" (1.505 m), weight 139 lb (63 kg), last menstrual period 08/07/2001, SpO2 96 %. General: No acute distress.  Patient appears well-groomed.   Head:  Normocephalic/atraumatic Eyes:  Fundi examined but not visualized Neck: supple, no paraspinal tenderness, full range of motion Heart:  Regular rate and rhythm Lungs:  Clear to auscultation bilaterally Back: No paraspinal tenderness Neurological Exam: alert and oriented to person, place, and time. Attention span and concentration intact, recent and remote memory intact, fund of knowledge intact.  Speech fluent and not dysarthric, language intact.  CN II-XII intact. Bulk and tone normal, muscle strength 5/5 throughout.  Sensation to light touch  intact.  Deep tendon reflexes 2+ throughout.  Finger to nose testing intact.  Gait normal, Romberg negative.  IMPRESSION: Migraine without aura, without status migrainosus, not intractable.  Much improved on Botox (from daily headaches down to 4 days in past 30 days)  PLAN: 1.  For preventative management, continue Botox.  We will continue Depakote for now, but I would eventually like to discontinue it.  We will check CBC, CMP and TSH. 2.  For abortive therapy, Tylenol 3.  Limit use of pain relievers to no more than 2 days out of week to prevent risk of rebound or medication-overuse headache. 4.  Keep headache diary 5.  Exercise, hydration, caffeine cessation, sleep hygiene, monitor for and avoid triggers 6.  Consider:  magnesium citrate 400mg  daily, riboflavin 400mg  daily, and coenzyme Q10 100mg  three times daily 7.  Follow up for next round of Botox in April.   Shon MilletAdam Correne Lalani, DO  CC: Mila PalmerSharon Wolters, MD

## 2018-09-17 ENCOUNTER — Encounter: Payer: Self-pay | Admitting: Neurology

## 2018-09-17 ENCOUNTER — Other Ambulatory Visit (INDEPENDENT_AMBULATORY_CARE_PROVIDER_SITE_OTHER): Payer: Medicare Other

## 2018-09-17 ENCOUNTER — Ambulatory Visit: Payer: Medicare Other | Admitting: Neurology

## 2018-09-17 VITALS — BP 122/66 | HR 72 | Ht 59.25 in | Wt 139.0 lb

## 2018-09-17 DIAGNOSIS — G43009 Migraine without aura, not intractable, without status migrainosus: Secondary | ICD-10-CM | POA: Diagnosis not present

## 2018-09-17 DIAGNOSIS — Z79899 Other long term (current) drug therapy: Secondary | ICD-10-CM

## 2018-09-17 LAB — COMPREHENSIVE METABOLIC PANEL
ALBUMIN: 4.3 g/dL (ref 3.5–5.2)
ALT: 11 U/L (ref 0–35)
AST: 16 U/L (ref 0–37)
Alkaline Phosphatase: 43 U/L (ref 39–117)
BILIRUBIN TOTAL: 0.4 mg/dL (ref 0.2–1.2)
BUN: 18 mg/dL (ref 6–23)
CO2: 26 mEq/L (ref 19–32)
Calcium: 9.5 mg/dL (ref 8.4–10.5)
Chloride: 98 mEq/L (ref 96–112)
Creatinine, Ser: 0.63 mg/dL (ref 0.40–1.20)
GFR: 94.28 mL/min (ref >60.00)
Glucose, Bld: 92 mg/dL (ref 70–99)
POTASSIUM: 3.9 meq/L (ref 3.5–5.1)
Sodium: 131 mEq/L — ABNORMAL LOW (ref 135–145)
TOTAL PROTEIN: 6.8 g/dL (ref 6.0–8.3)

## 2018-09-17 LAB — CBC
HCT: 40.6 % (ref 36.0–46.0)
HEMOGLOBIN: 13.7 g/dL (ref 12.0–15.0)
MCHC: 33.8 g/dL (ref 30.0–36.0)
MCV: 95.8 fl (ref 78.0–100.0)
PLATELETS: 283 10*3/uL (ref 150.0–400.0)
RBC: 4.24 Mil/uL (ref 3.87–5.11)
RDW: 12.9 % (ref 11.5–15.5)
WBC: 5 10*3/uL (ref 4.0–10.5)

## 2018-09-17 LAB — TSH: TSH: 3.48 u[IU]/mL (ref 0.35–4.50)

## 2018-09-17 NOTE — Patient Instructions (Addendum)
1.  Follow up for next round of Botox 2.  Continue Depakote for now.  We will check CBC and CMP and TSH today.  Goal is to eventually get off of it. 3.  Limit use of pain relievers to no more than 2 days out of week to prevent risk of rebound or medication-overuse headache. 4.  Keep headache diary  Your provider has requested that you have labwork completed today. Please go to [facility] ([id number]) on the second floor of this building before leaving the office today. You do not need to check in. If you are not called within 15 minutes please check with the front desk.

## 2018-09-17 NOTE — Addendum Note (Signed)
Addended by: Dorthy Cooler on: 09/17/2018 01:59 PM   Modules accepted: Orders

## 2018-09-18 ENCOUNTER — Telehealth: Payer: Self-pay | Admitting: *Deleted

## 2018-09-18 ENCOUNTER — Encounter: Payer: Self-pay | Admitting: *Deleted

## 2018-09-18 NOTE — Telephone Encounter (Signed)
Results sent via My Chart.  

## 2018-09-18 NOTE — Telephone Encounter (Signed)
-----   Message from Drema Dallas, DO sent at 09/18/2018  7:00 AM EST ----- Sodium is still a little low but stable (it's 131.  It was 127 back in June)

## 2018-10-10 NOTE — Telephone Encounter (Signed)
Patient got locked out of her mychart. She called in and was made aware of options. She is going to think it over and call back to let us know.

## 2018-10-11 ENCOUNTER — Ambulatory Visit (INDEPENDENT_AMBULATORY_CARE_PROVIDER_SITE_OTHER): Payer: Medicare Other

## 2018-10-11 DIAGNOSIS — R51 Headache: Secondary | ICD-10-CM | POA: Diagnosis not present

## 2018-10-11 DIAGNOSIS — R519 Headache, unspecified: Secondary | ICD-10-CM

## 2018-10-11 MED ORDER — METOCLOPRAMIDE HCL 5 MG/ML IJ SOLN
10.0000 mg | Freq: Once | INTRAMUSCULAR | Status: AC
  Administered 2018-10-11: 10 mg via INTRAMUSCULAR

## 2018-10-11 MED ORDER — KETOROLAC TROMETHAMINE 60 MG/2ML IM SOLN
60.0000 mg | Freq: Once | INTRAMUSCULAR | Status: AC
  Administered 2018-10-11: 60 mg via INTRAMUSCULAR

## 2018-10-11 MED ORDER — DIPHENHYDRAMINE HCL 50 MG/ML IJ SOLN
50.0000 mg | Freq: Once | INTRAMUSCULAR | Status: AC
  Administered 2018-10-11: 50 mg via INTRAMUSCULAR

## 2018-10-22 ENCOUNTER — Other Ambulatory Visit: Payer: Self-pay | Admitting: Neurology

## 2018-10-23 ENCOUNTER — Encounter: Payer: Self-pay | Admitting: *Deleted

## 2018-10-23 NOTE — Progress Notes (Addendum)
Cover my meds [name] (Key: [id number]) - [id number] Botox 200UNIT solution Status: Sent to Plan Created: March 18th, 2020 Sent: March 18th, 2020   [facility] fax [facility]  10/24/2018 ref# [id number] valid  10/24/2018-01/24/2019

## 2018-11-07 NOTE — Progress Notes (Signed)
Virtual Visit via Video Note The purpose of this virtual visit is to provide medical care while limiting exposure to the novel coronavirus.    Consent was obtained for video visit:  Yes. Answered questions that patient had about telehealth interaction:  Yes. I discussed the limitations, risks, security and privacy concerns of performing an evaluation and management service by telemedicine. I also discussed with the patient that there may be a patient responsible charge related to this service. The patient expressed understanding and agreed to proceed.  Pt location: Home Physician Location: office Name of referring provider:  Mila Palmer, MD I connected with [name] at patients initiation/request on 11/08/2018 at 11:10 AM EDT by video enabled telemedicine application and verified that I am speaking with the correct person using two identifiers. Pt MRN:  [id number] Pt DOB:  07/09/1951 Video Participants:  [name]   History of Present Illness:  [name] is a 68 year old right-handed Caucasian woman with depression, anxiety and history of benzodiazepine and barbiturates dependence and gastric ulcer who follows up for migraines.  UPDATE: Depakote was reduced to 250mg  because it caused increase hunger.  Due to the current COVID-19 panedemic, Botox is on-hold until further notice.  Her next round of Botox was supposed to be scheduled for today.  Since this pandemic started, she reports increased anxiety.  She has had some increase in headaches but not too bad.  She is concerned that she will develop chronic headaches again. Current NSAIDS:contraindicated Current analgesics:Tylenol Current triptans:no Current ergotamine:no Current anti-emetic:no Current muscle relaxants:no Current anti-anxiolytic:Librium Current sleep aide:Librium Current Antihypertensive medications:no Current Antidepressant medications:no Current Anticonvulsant  medications:Depakote ER250mg  daily Current anti-CGRP:no Current Vitamins/Herbal/Supplements:D Current Antihistamines/Decongestants:no Other therapy: Botox Hormone:estradiol  Caffeine:1 cup 1/2 decaf coffee daily Alcohol:no Smoker:no Diet:hydrates Exercise:Stopped due to torn knee meniscus Depression:no; Anxiety:yes. Other pain:Knee pain Sleep hygiene:poor  HISTORY:  Onset: 2016.She has remote history of migraines which were controlled for many years. In December 2015, her husband unexpectedly passed away, which triggered depression and anxiety. She subsequently recovered. However, in early June2016, she witnessed her boyfriend's ill father pass away. It triggered flashbacks of her husband's death, which increased depression, anxiety and migraines. She has significant history of side effects to multiple medications. Location: Left peri-orbital Quality: pounding Initial Intensity: 10/10 Aura: no Prodrome: no Associated symptoms: Nausea, photophobia, phonophobia, osmophobia Initial Duration: All day Initial Frequency: 15 days per month Triggers: Emotional stress Relieving factors: Tramadol Activity: aggravates  Past NSAIDS: Contraindicated due to bleeding ulcer Past analgesics: Cannot take ASA products such as Excedrin due to bleeding ulcer Past abortive triptans: Relpax (took it multiple times but caused chest tightness once, but was effective), sumatriptan (many years ago) Past anti-emetic: Zofran 8mg  (effective) Past anti-anxiolytic: benzodiazepines Past antihypertensive medications: propranolol (chest tightness) Past antidepressant medications: Multiple, such as Cymbalta. She has had intolerance to multiple antidepressants over the years. Past anticonvulsant medications: topiramate (chest tightness) Past anti-CGRP: Aimovig (constipation, nausea) Past vitamins/Herbal/Supplements: no  Family history of headache: Mom,  sister  MRI and MRA of head from 11/05/10 were personally reviewed and were unremarkable.  Past Medical History: Past Medical History:  Diagnosis Date  . Abnormal Pap smear of cervix 07/2016   CIN1  . Constipation   . Depression   . Diverticulosis   . H. pylori infection   . History of blood transfusion 1984  . Hx of colonic polyps   . Hypercholesteremia   . Hyponatremia   . Migraines   . Vitamin D deficiency  Medications: Outpatient Encounter Medications as of 11/08/2018  Medication Sig Note  . acetaminophen (TYLENOL) 325 MG tablet Take 325 mg by mouth every 6 (six) hours as needed.    . chlordiazePOXIDE (LIBRIUM) 10 MG capsule Take 20 mg by mouth at bedtime.    . cholecalciferol (VITAMIN D) 1000 units tablet Take 1,000 Units by mouth at bedtime.   . divalproex (DEPAKOTE ER) 250 MG 24 hr tablet Take 2 tablets at bedtime. (Patient taking differently: Take 250 mg by mouth at bedtime. )   . docusate sodium (COLACE) 100 MG capsule Take 100 mg by mouth at bedtime.    . lansoprazole (PREVACID) 30 MG capsule Take 30 mg by mouth 2 (two) times daily before a meal.  04/12/2017: Needs refill  . Omega-3 Fatty Acids (FISH OIL PO) Take 1 capsule by mouth daily.   . OnabotulinumtoxinA (BOTOX IJ)    . polyethylene glycol (MIRALAX / GLYCOLAX) packet Take 17 g by mouth at bedtime.    . Simethicone (PHAZYME MAXIMUM STRENGTH) 250 MG CAPS Take 250 mg by mouth daily as needed (gas).   [name]. XIIDRA 5 % SOLN Place 1 drop into both eyes 2 (two) times daily.    . [DISCONTINUED] conjugated estrogens (PREMARIN) vaginal cream Place 1 Applicatorful vaginally daily. Use 1/2 g vaginally two or three times per week as needed to maintain symptom relief. (Patient not taking: Reported on 09/02/2018)    No facility-administered encounter medications on file as of 11/08/2018.     Allergies: Allergies  Allergen Reactions  . Aspirin Other (See Comments)    Hx of bleeding stomach ulcer   . Crestor [Rosuvastatin Calcium]  Itching  . Inderal [Propranolol] Other (See Comments)    Chest tightness  . Macrobid [Nitrofurantoin Monohyd Macro] Diarrhea  . Other     Other reaction(s): Other (See Comments) Cholesterol meds-multiple reactions to multiple meds  . Relpax [Eletriptan Hydrobromide] Other (See Comments)    Chest Tightness   . Salicylates Other (See Comments)    Told not to take due to hx of GI ulcer  . Topamax [Topiramate] Other (See Comments)    Chest tightness   Family History: Family History  Problem Relation Age of Onset  . Migraines Mother   . Migraines Sister   . Cancer Father        Prostate   Social History: Social History   Socioeconomic History  . Marital status: Widowed    Spouse name: Not on file  . Number of children: Not on file  . Years of education: Not on file  . Highest education level: Not on file  Occupational History  . Occupation: LPN  Social Needs  . Financial resource strain: Not on file  . Food insecurity:    Worry: Not on file    Inability: Not on file  . Transportation needs:    Medical: Not on file    Non-medical: Not on file  Tobacco Use  . Smoking status: Never Smoker  . Smokeless tobacco: Never Used  Substance and Sexual Activity  . Alcohol use: No  . Drug use: No  . Sexual activity: Not Currently    Birth control/protection: Post-menopausal, Surgical    Comment: Hyst  Lifestyle  . Physical activity:    Days per week: Not on file    Minutes per session: Not on file  . Stress: Not on file  Relationships  . Social connections:    Talks on phone: Not on file    Gets together: Not  on file    Attends religious service: Not on file    Active member of club or organization: Not on file    Attends meetings of clubs or organizations: Not on file    Relationship status: Not on file  . Intimate partner violence:    Fear of current or ex partner: Not on file    Emotionally abused: Not on file    Physically abused: Not on file    Forced sexual  activity: Not on file  Other Topics Concern  . Not on file  Social History Narrative   Pt has L.P.N through [facility]   Review Of Systems: Review of Systems  Constitutional: Negative for chills, fever and malaise/fatigue.  HENT: Negative for congestion, ear discharge, ear pain, hearing loss, nosebleeds, sore throat and tinnitus.   Eyes: Negative for blurred vision, double vision, pain, discharge and redness.  Respiratory: Negative for cough, sputum production, shortness of breath and wheezing.   Cardiovascular: Negative for chest pain, palpitations and orthopnea.  Gastrointestinal: Negative for abdominal pain, constipation, diarrhea, nausea and vomiting.  Genitourinary: Negative for dysuria.  Musculoskeletal: Negative for myalgias.  Skin: Negative for itching and rash.  Endo/Heme/Allergies: Does not bruise/bleed easily.  Psychiatric/Behavioral: Positive for depression. The patient is nervous/anxious.   Neurological:  As above  Observations/Objective:   There were no vitals filed for this visit.  alert and oriented to person, place, and time. Attention span and concentration intact, recent and remote memory intact, fund of knowledge intact.  Speech fluent and not dysarthric, language intact.  Face symmetric.  Assessment and Plan:   Migraine without aura, without status migrainosus, not intractable  1.  Until I can perform Botox, I will start her on Emgality.  If this is not feasible, then we will try zonisamide 25mg  daily for one week, then 50mg  daily for one week, then 100mg  daily.  She will also continue Depakote ER 250mg  daily for now. 2.  Limit use of pain relievers to no more than 2 days out of week to prevent risk of rebound or medication-overuse headache. 3.  Headache diary 4.  Follow up in 4 months.  Follow Up Instructions:    -I discussed the assessment and treatment plan with the patient. The patient was provided an opportunity to ask questions and all were answered. The  patient agreed with the plan and demonstrated an understanding of the instructions.   The patient was advised to call back or seek an in-person evaluation if the symptoms worsen or if the condition fails to improve as anticipated.    Cira Servant, DO

## 2018-11-08 ENCOUNTER — Encounter: Payer: Self-pay | Admitting: Neurology

## 2018-11-08 ENCOUNTER — Telehealth (INDEPENDENT_AMBULATORY_CARE_PROVIDER_SITE_OTHER): Payer: Medicare Other | Admitting: Neurology

## 2018-11-08 ENCOUNTER — Other Ambulatory Visit: Payer: Self-pay

## 2018-11-08 DIAGNOSIS — G43009 Migraine without aura, not intractable, without status migrainosus: Secondary | ICD-10-CM | POA: Diagnosis not present

## 2018-11-08 MED ORDER — GALCANEZUMAB-GNLM 120 MG/ML ~~LOC~~ SOAJ: 240.0000 mg | Freq: Once | SUBCUTANEOUS | 0 refills | Status: AC

## 2018-11-13 ENCOUNTER — Telehealth: Payer: Self-pay | Admitting: Neurology

## 2018-11-13 MED ORDER — GALCANEZUMAB-GNLM 120 MG/ML ~~LOC~~ SOAJ: 120.0000 mg | SUBCUTANEOUS | 11 refills | Status: DC

## 2018-11-13 MED ORDER — GALCANEZUMAB-GNLM 120 MG/ML ~~LOC~~ SOAJ: 240.0000 mg | Freq: Once | SUBCUTANEOUS | 0 refills | Status: AC

## 2018-11-13 NOTE — Progress Notes (Addendum)
Initiated on Cover My Meds Key: [facility])   Rcvd approval  Request Reference Number: [id number]. EMGALITY INJ 120MG /ML is approved through 08/07/2019. For further questions, call [phone]

## 2018-11-13 NOTE — Addendum Note (Signed)
Addended by: Dorthy Cooler on: 11/13/2018 02:52 PM   Modules accepted: Orders

## 2018-11-13 NOTE — Telephone Encounter (Addendum)
Rcvd approval for Emgality.  Called Pt and advised her.

## 2018-11-13 NOTE — Telephone Encounter (Signed)
Patient called regarding her Injection medication needing a Prior Auth. She said [facility] is waiting on a prior Auth. Please Call. Thanks

## 2018-12-12 ENCOUNTER — Other Ambulatory Visit: Payer: Self-pay | Admitting: Neurology

## 2018-12-13 ENCOUNTER — Telehealth: Payer: Self-pay | Admitting: Neurology

## 2018-12-13 NOTE — Telephone Encounter (Signed)
Error. Patient said she wanted to leave message and then changed her mind. Thanks!

## 2018-12-17 ENCOUNTER — Telehealth: Payer: Self-pay | Admitting: Neurology

## 2018-12-17 DIAGNOSIS — G43709 Chronic migraine without aura, not intractable, without status migrainosus: Secondary | ICD-10-CM

## 2018-12-17 MED ORDER — ONDANSETRON HCL 4 MG PO TABS: 4.0000 mg | ORAL_TABLET | Freq: Three times a day (TID) | ORAL | 0 refills | Status: DC | PRN

## 2018-12-17 NOTE — Telephone Encounter (Signed)
Patient said she has a horrible migraine and took the emgality shot yesterday but is having a lot of nausea today. She was wondering if you could call in something to the [facility] on file for that. Thanks!

## 2018-12-17 NOTE — Telephone Encounter (Signed)
Called and advised Pt Dr. Karel Jarvis sent in Zofran 4mg  [id number] PRN n/v

## 2018-12-31 DIAGNOSIS — M545 Low back pain, unspecified: Secondary | ICD-10-CM | POA: Insufficient documentation

## 2019-01-03 ENCOUNTER — Other Ambulatory Visit: Payer: Self-pay

## 2019-01-03 ENCOUNTER — Ambulatory Visit (INDEPENDENT_AMBULATORY_CARE_PROVIDER_SITE_OTHER): Payer: Medicare Other | Admitting: Neurology

## 2019-01-03 DIAGNOSIS — G43709 Chronic migraine without aura, not intractable, without status migrainosus: Secondary | ICD-10-CM | POA: Diagnosis not present

## 2019-01-03 MED ORDER — ONABOTULINUMTOXINA 100 UNITS IJ SOLR
155.0000 [IU] | Freq: Once | INTRAMUSCULAR | Status: AC
  Administered 2019-01-03: 16:00:00 155 [IU] via INTRAMUSCULAR

## 2019-01-03 NOTE — Progress Notes (Signed)
[facility]   Procedure Note Botox  Attending: Dr. Diallo Ponder  Preoperative Diagnosis(es): Chronic migraine  Consent obtained from: The patient Benefits discussed included, but were not limited to decreased muscle tightness, increased joint range of motion, and decreased pain.  Risk discussed included, but were not limited pain and discomfort, bleeding, bruising, excessive weakness, venous thrombosis, muscle atrophy and dysphagia.  Anticipated outcomes of the procedure as well as he risks and benefits of the alternatives to the procedure, and the roles and tasks of the personnel to be involved, were discussed with the patient, and the patient consents to the procedure and agrees to proceed. A copy of the patient medication guide was given to the patient which explains the blackbox warning.  Patients identity and treatment sites confirmed Yes.  Details of Procedure: Skin was cleaned with alcohol. Prior to injection, the needle plunger was aspirated to make sure the needle was not within a blood vessel.  There was no blood retrieved on aspiration.    Following is a summary of the muscles injected  And the amount of Botulinum toxin used:  Dilution 200 units of Botox was reconstituted with 4 ml of preservative free normal saline. Time of reconstitution: At the time of the office visit (<30 minutes prior to injection)   Injections  155 total units of Botox was injected with a 30 gauge needle.  Injection Sites: L occipitalis: 15 units- 3 sites  R occiptalis: 15 units- 3 sites  L upper trapezius: 15 units- 3 sites R upper trapezius: 15 units- 3 sits          L paraspinal: 10 units- 2 sites R paraspinal: 10 units- 2 sites  Face L frontalis(2 injection sites):10 units   R frontalis(2 injection sites):10 units         L corrugator: 5 units   R corrugator: 5 units           Procerus: 5 units   L temporalis: 20 units R temporalis: 20 units   Agent:  200 units of botulinum Type A  (Onobotulinum Toxin type A) was reconstituted with 4 ml of preservative free normal saline.  Time of reconstitution: At the time of the office visit (<30 minutes prior to injection)     Total injected (Units): 155  Total wasted (Units): 7.5  Patient tolerated procedure well without complications.   Reinjection is anticipated in 3 months.    

## 2019-02-24 ENCOUNTER — Telehealth: Payer: Self-pay | Admitting: Obstetrics and Gynecology

## 2019-02-24 DIAGNOSIS — M25561 Pain in right knee: Secondary | ICD-10-CM | POA: Insufficient documentation

## 2019-02-24 NOTE — Telephone Encounter (Signed)
Spoke with patient. Reports vaginal irritation, redness and discomfort that started 3 wks ago. Was using coconut oil, states no longer effective. Reports odor, burning when urine touches skin. Denies vag d/c, urinary symptoms or bleeding. She switch back to applying premarin vaginal cream to vulva, reports increased redness. Advised to stop using premarin vag cream until seen in office. Patient declines OV today with Melvia Heaps, CNM, declines OV on 7/21 at 8am with Dr. Quincy Simmonds, states her car is going for repair . OV scheduled for 7/22 at 1pm with Dr. Quincy Simmonds. Covid 19 precautions reviewed, prescreen negative.   Routing to provider for final review. Patient is agreeable to disposition. Will close encounter.

## 2019-02-24 NOTE — Telephone Encounter (Signed)
Patient stated that she is currently experiencing burning and irritation. Patient stated that she was using frozen coconut oil, but it stopped helping. Patient stated that she put Premarin on the outside of the vaginal area and it helped for a few days, but is now experiencing redness and irritation. Patient stated that there is now an odor as well.

## 2019-02-26 ENCOUNTER — Encounter: Payer: Self-pay | Admitting: Obstetrics and Gynecology

## 2019-02-26 ENCOUNTER — Other Ambulatory Visit: Payer: Self-pay

## 2019-02-26 ENCOUNTER — Ambulatory Visit: Payer: Medicare Other | Admitting: Obstetrics and Gynecology

## 2019-02-26 VITALS — BP 126/82 | HR 72 | Temp 97.9°F | Resp 12 | Ht 59.25 in | Wt 139.0 lb

## 2019-02-26 DIAGNOSIS — N760 Acute vaginitis: Secondary | ICD-10-CM

## 2019-02-26 MED ORDER — NYSTATIN-TRIAMCINOLONE 100000-0.1 UNIT/GM-% EX OINT: 1.0000 "application " | TOPICAL_OINTMENT | Freq: Two times a day (BID) | CUTANEOUS | 0 refills | Status: DC

## 2019-02-26 NOTE — Progress Notes (Signed)
GYNECOLOGY  VISIT   HPI: 68 y.o.   Widowed  Caucasian  female   G2P1002 with Patient's last menstrual period was 08/07/2001 (approximate).   here for vaginitis. Patient complains of having vaginal dryness with redness and irritation.    Feels like her vulva is uncomfortable almost all the time.  It is a burning feeling.  Coconut oil helps only temporarily.   The urine hurts the skin.   She used Premarin cream, and it felt somewhat better.   She stopped this due to recurrent yeast infections.  No discharge.   Not spending time outside.   Really liked Diflucan.   Had an injection in bilateral knees about 2.5 weeks ago.   Feeling down due to the pandemic.  Talking to her therapist.   GYNECOLOGIC HISTORY: Patient's last menstrual period was 08/07/2001 (approximate). Contraception:  Hysterectomy Menopausal hormone therapy:  none Last mammogram:  07/29/18 BIRADS 1 negative/density b Last pap smear:   05/20/18 Negative        OB History    Gravida  2   Para  2   Term  1   Preterm      AB      Living  2     SAB      TAB      Ectopic      Multiple      Live Births  2              Patient Active Problem List   Diagnosis Date Noted  . Status post laparoscopy-assisted vaginal hysterectomy 04/24/2017  . Dry eye syndrome 03/17/2016  . Headache 03/17/2016  . Hyperlipidemia 03/17/2016  . Lack of bladder control 03/17/2016  . Migraines 03/17/2016  . 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 Abnormal Pap smear of cervix 07/2016   CIN1  . Constipation   . Depression   . Diverticulosis   . H. pylori infection   . History of blood transfusion 1984  . Hx of colonic polyps   . Hypercholesteremia   . Hyponatremia   . Migraines   . Vitamin D deficiency     Past Surgical History:  Procedure Laterality Date  . ABDOMINAL HYSTERECTOMY    . ANTERIOR AND POSTERIOR REPAIR  N/A 04/24/2017   Procedure: ANTERIOR (CYSTOCELE) AND POSTERIOR (RECTOCELE)  REPAIR;  Surgeon: Nunzio Cobbs, MD;  Location: [facility];  Service: Gynecology;  Laterality: N/A;  . [place]  . COLONOSCOPY  2012  . COSMETIC SURGERY     Neck lift  . CYSTOSCOPY N/A 04/24/2017   Procedure: CYSTOSCOPY;  Surgeon: Nunzio Cobbs, MD;  Location: [facility];  Service: Gynecology;  Laterality: N/A;  . [place]  . LAPAROSCOPIC LYSIS OF ADHESIONS N/A 04/24/2017   Procedure: EXTENSIVE LAPAROSCOPIC LYSIS OF ADHESIONS;  Surgeon: Nunzio Cobbs, MD;  Location: [facility];  Service: Gynecology;  Laterality: N/A;  . LAPAROSCOPIC VAGINAL HYSTERECTOMY WITH SALPINGO OOPHORECTOMY Bilateral 04/24/2017   Procedure: LAPAROSCOPIC ASSISTED VAGINAL HYSTERECTOMY WITH SALPINGO OOPHORECTOMY and pelvic washings;  Surgeon: Nunzio Cobbs, MD;  Location: [facility];  Service: Gynecology;  Laterality: Bilateral;  3 hours  . RECTAL PROLAPSE REPAIR     Dr. Lennie Hummer  . UMBILICAL HERNIA REPAIR     had surgery as a child.    Current Outpatient Medications  Medication Sig Dispense  Refill  . acetaminophen (TYLENOL) 325 MG tablet Take 325 mg by mouth every 6 (six) hours as needed.     . Calcium 600-200 MG-UNIT tablet Take 1 tablet by mouth daily.    . chlordiazePOXIDE (LIBRIUM) 10 MG capsule Take 10 mg by mouth at bedtime.     . divalproex (DEPAKOTE ER) 250 MG 24 hr tablet Take 2 tablets at bedtime. (Patient taking differently: Take 250 mg by mouth at bedtime. ) 60 tablet 3  . docusate sodium (COLACE) 100 MG capsule Take 100 mg by mouth at bedtime.     . OnabotulinumtoxinA (BOTOX IJ)     . ondansetron (ZOFRAN) 4 MG tablet Take 1 tablet (4 mg total) by mouth every 8 (eight) hours as needed for nausea or vomiting. 20 tablet 0  . polyethylene glycol (MIRALAX / GLYCOLAX) packet Take 17 g by mouth at bedtime.     . Simethicone (PHAZYME MAXIMUM STRENGTH) 250 MG CAPS Take 250 mg by  mouth daily as needed (gas).    . traMADol (ULTRAM) 50 MG tablet as needed.    [name]. XIIDRA 5 % SOLN Place 1 drop into both eyes 2 (two) times daily.     . lansoprazole (PREVACID) 30 MG capsule Take 30 mg by mouth 2 (two) times daily before a meal.     . Omega-3 Fatty Acids (FISH OIL PO) Take 1 capsule by mouth daily.     No current facility-administered medications for this visit.      ALLERGIES: Aspirin, Crestor [rosuvastatin calcium], Inderal [propranolol], Macrobid [nitrofurantoin monohyd macro], Other, Relpax [eletriptan hydrobromide], Salicylates, and Topamax [topiramate]  Family History  Problem Relation Age of Onset  . Migraines Mother   . Migraines Sister   . Cancer Father        Prostate    Social History   Socioeconomic History  . Marital status: Widowed    Spouse name: Not on file  . Number of children: Not on file  . Years of education: Not on file  . Highest education level: Not on file  Occupational History  . Occupation: LPN  Social Needs  . Financial resource strain: Not on file  . Food insecurity    Worry: Not on file    Inability: Not on file  . Transportation needs    Medical: Not on file    Non-medical: Not on file  Tobacco Use  . Smoking status: Never Smoker  . Smokeless tobacco: Never Used  Substance and Sexual Activity  . Alcohol use: No  . Drug use: No  . Sexual activity: Not Currently    Birth control/protection: Post-menopausal, Surgical    Comment: Hyst  Lifestyle  . Physical activity    Days per week: Not on file    Minutes per session: Not on file  . Stress: Not on file  Relationships  . Social Musicianconnections    Talks on phone: Not on file    Gets together: Not on file    Attends religious service: Not on file    Active member of club or organization: Not on file    Attends meetings of clubs or organizations: Not on file    Relationship status: Not on file  . Intimate partner violence    Fear of current or ex partner: Not on file     Emotionally abused: Not on file    Physically abused: Not on file    Forced sexual activity: Not on file  Other Topics Concern  . Not on  file  Social History Narrative   Pt has L.P.N through [facility]    Review of Systems  Constitutional:       Night sweats  HENT: Negative.   Eyes: Negative.   Respiratory: Negative.   Cardiovascular: Negative.   Gastrointestinal: Negative.   Endocrine: Negative.   Genitourinary:       Vaginal irritation Redness Dryness  Musculoskeletal: Negative.   Skin: Negative.   Allergic/Immunologic: Negative.   Neurological: Negative.   Hematological: Negative.   Psychiatric/Behavioral: Negative.     PHYSICAL EXAMINATION:    BP 126/82 (BP Location: Left Arm, Patient Position: Sitting, Cuff Size: Normal)   Pulse 72   Temp 97.9 F (36.6 C) (Temporal)   Resp 12   Ht 4' 11.25" (1.505 m)   Wt 139 lb (63 kg)   LMP 08/07/2001 (Approximate)   BMI 27.84 kg/m     General appearance: alert, cooperative and appears stated age  Pelvic: External genitalia:  no lesions.  Minimal erythema.               Urethra:  normal appearing urethra with no masses, tenderness or lesions              Bartholins and Skenes: normal                 Vagina: normal appearing vagina with normal color and discharge, no lesions              Cervix: absent.                Bimanual Exam:  Uterus:  absent              Adnexa: no mass, fullness, tenderness            Chaperone was present for exam.  ASSESSMENT  Vulvovaginitis. Chronic.  Atrophy.   PLAN  Affirm.  Mycolog II.  She would like to avoid hormonal tx.  We did discuss Osphena and Intrarosa.  Consider Gabapentin cream or lidocaine jelly.  FU in 2 weeks.  An After Visit Summary was printed and given to the patient.  _15_____ minutes face to face time of which over 50% was spent in counseling.

## 2019-02-27 LAB — VAGINITIS/VAGINOSIS, DNA PROBE
Candida Species: NEGATIVE
Gardnerella vaginalis: NEGATIVE
Trichomonas vaginosis: NEGATIVE

## 2019-03-05 ENCOUNTER — Ambulatory Visit: Payer: Medicare Other | Admitting: Neurology

## 2019-03-07 ENCOUNTER — Other Ambulatory Visit: Payer: Self-pay

## 2019-03-07 NOTE — Progress Notes (Signed)
GYNECOLOGY  VISIT   HPI: 68 y.o.   Widowed  Caucasian  female   G2P1002 with Patient's last menstrual period was 08/07/2001 (approximate).   here for 2 week follow up.     Her has burning, redness and irritation of the vulva.  I treated with Mycolog at her last visit.  She had a negative Affirm test.   States she is better. Looks better.  "It is just dry." It feels uncomfortable like sandpaper.  She does have stinging and thinks this is due to sweating at night.  Feels it when she walks.   When she used Premarin cream, she was getting yeast infections.  It did not help as much as the Nystatin.  She has not tried Vaseline.  Replens works for about 10 minutes.   GYNECOLOGIC HISTORY: Patient's last menstrual period was 08/07/2001 (approximate). Contraception: Hysterectomy Menopausal hormone therapy:  none Last mammogram: 07/29/18 BIRADS 1 negative/density b Last pap smear: 05-20-18 Neg        OB History    Gravida  2   Para  2   Term  1   Preterm      AB      Living  2     SAB      TAB      Ectopic      Multiple      Live Births  2              Patient Active Problem List   Diagnosis Date Noted  . Status post laparoscopy-assisted vaginal hysterectomy 04/24/2017  . Dry eye syndrome 03/17/2016  . Headache 03/17/2016  . Hyperlipidemia 03/17/2016  . Lack of bladder control 03/17/2016  . Migraines 03/17/2016  . 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 Abnormal Pap smear of cervix 07/2016   CIN1  . Constipation   . Depression   . Diverticulosis   . H. pylori infection   . History of blood transfusion 1984  . Hx of colonic polyps   . Hypercholesteremia   . Hyponatremia   . Migraines   . Vitamin D deficiency     Past Surgical History:  Procedure Laterality Date  . ABDOMINAL HYSTERECTOMY    . ANTERIOR AND POSTERIOR REPAIR N/A 04/24/2017   Procedure:  ANTERIOR (CYSTOCELE) AND POSTERIOR (RECTOCELE)  REPAIR;  Surgeon: Patton SallesAmundson C Silva, Mariadelcarmen Corella E, MD;  Location: [facility];  Service: Gynecology;  Laterality: N/A;  . CESAREAN SECTION  1984  . COLONOSCOPY  2012  . COSMETIC SURGERY     Neck lift  . CYSTOSCOPY N/A 04/24/2017   Procedure: CYSTOSCOPY;  Surgeon: Patton SallesAmundson C Silva, Zubair Lofton E, MD;  Location: [facility];  Service: Gynecology;  Laterality: N/A;  . HEMORRHOID SURGERY  1984  . LAPAROSCOPIC LYSIS OF ADHESIONS N/A 04/24/2017   Procedure: EXTENSIVE LAPAROSCOPIC LYSIS OF ADHESIONS;  Surgeon: Patton SallesAmundson C Silva, Keyonta Barradas E, MD;  Location: [facility];  Service: Gynecology;  Laterality: N/A;  . LAPAROSCOPIC VAGINAL HYSTERECTOMY WITH SALPINGO OOPHORECTOMY Bilateral 04/24/2017   Procedure: LAPAROSCOPIC ASSISTED VAGINAL HYSTERECTOMY WITH SALPINGO OOPHORECTOMY and pelvic washings;  Surgeon: Patton SallesAmundson C Silva, Edelmiro Innocent E, MD;  Location: [facility];  Service: Gynecology;  Laterality: Bilateral;  3 hours  . RECTAL PROLAPSE REPAIR     Dr. Kendrick Ranchim Davis  . UMBILICAL HERNIA REPAIR     had surgery as a child.    Current Outpatient Medications  Medication Sig Dispense Refill  .  acetaminophen (TYLENOL) 325 MG tablet Take 325 mg by mouth every 6 (six) hours as needed.     . Calcium 600-200 MG-UNIT tablet Take 1 tablet by mouth daily.    . chlordiazePOXIDE (LIBRIUM) 10 MG capsule Take 10 mg by mouth at bedtime.     . divalproex (DEPAKOTE ER) 250 MG 24 hr tablet Take 2 tablets at bedtime. (Patient taking differently: Take 250 mg by mouth at bedtime. ) 60 tablet 3  . docusate sodium (COLACE) 100 MG capsule Take 100 mg by mouth at bedtime.     . lansoprazole (PREVACID) 30 MG capsule Take 30 mg by mouth 2 (two) times daily before a meal.     . nystatin-triamcinolone ointment (MYCOLOG) Apply 1 application topically 2 (two) times daily. Use for one week as needed. 30 g 0  . Omega-3 Fatty Acids (FISH OIL PO) Take 1 capsule by mouth daily.    . OnabotulinumtoxinA (BOTOX IJ)     . ondansetron  (ZOFRAN) 4 MG tablet Take 1 tablet (4 mg total) by mouth every 8 (eight) hours as needed for nausea or vomiting. 20 tablet 0  . polyethylene glycol (MIRALAX / GLYCOLAX) packet Take 17 g by mouth at bedtime.     . Simethicone (PHAZYME MAXIMUM STRENGTH) 250 MG CAPS Take 250 mg by mouth daily as needed (gas).    . traMADol (ULTRAM) 50 MG tablet as needed.    [name]. XIIDRA 5 % SOLN Place 1 drop into both eyes 2 (two) times daily.      No current facility-administered medications for this visit.      ALLERGIES: Aspirin, Crestor [rosuvastatin calcium], Inderal [propranolol], Macrobid [nitrofurantoin monohyd macro], Other, Relpax [eletriptan hydrobromide], Salicylates, and Topamax [topiramate]  Family History  Problem Relation Age of Onset  . Migraines Mother   . Migraines Sister   . Cancer Father        Prostate    Social History   Socioeconomic History  . Marital status: Widowed    Spouse name: Not on file  . Number of children: Not on file  . Years of education: Not on file  . Highest education level: Not on file  Occupational History  . Occupation: LPN  Social Needs  . Financial resource strain: Not on file  . Food insecurity    Worry: Not on file    Inability: Not on file  . Transportation needs    Medical: Not on file    Non-medical: Not on file  Tobacco Use  . Smoking status: Never Smoker  . Smokeless tobacco: Never Used  Substance and Sexual Activity  . Alcohol use: No  . Drug use: No  . Sexual activity: Not Currently    Birth control/protection: Post-menopausal, Surgical    Comment: Hyst  Lifestyle  . Physical activity    Days per week: Not on file    Minutes per session: Not on file  . Stress: Not on file  Relationships  . Social Musicianconnections    Talks on phone: Not on file    Gets together: Not on file    Attends religious service: Not on file    Active member of club or organization: Not on file    Attends meetings of clubs or organizations: Not on file     Relationship status: Not on file  . Intimate partner violence    Fear of current or ex partner: Not on file    Emotionally abused: Not on file    Physically abused: Not  on file    Forced sexual activity: Not on file  Other Topics Concern  . Not on file  Social History Narrative   Pt has L.P.N through [place]    Review of Systems  Constitutional: Negative.   HENT: Negative.   Eyes: Negative.   Respiratory: Negative.   Cardiovascular: Negative.   Gastrointestinal: Negative.   Endocrine: Positive for heat intolerance.       Night sweats  Genitourinary:       Vaginal dryness   Musculoskeletal: Negative.   Skin: Negative.   Allergic/Immunologic: Negative.   Neurological: Negative.   Hematological: Negative.   Psychiatric/Behavioral: Negative.     PHYSICAL EXAMINATION:    BP 116/82 (BP Location: Right Arm, Patient Position: Sitting, Cuff Size: Normal)   Pulse 86   Temp (!) 97.2 F (36.2 C) (Skin)   Resp 14   Ht 4' 11.25" (1.505 m)   Wt 127 lb 6.4 oz (57.8 kg)   LMP 08/07/2001 (Approximate)   BMI 25.51 kg/m     General appearance: alert, cooperative and appears stated age  Pelvic: External genitalia:  no lesions.  Minima erythema.               Urethra:  normal appearing urethra with no masses, tenderness or lesions              Bartholins and Skenes: normal                 Vagina: normal appearing vagina with normal color and discharge, no lesions              Cervix: no lesions                Bimanual Exam:  Uterus:  normal size, contour, position, consistency, mobility, non-tender              Adnexa: no mass, fullness, tenderness             Chaperone was present for exam.  ASSESSMENT  Vulvar pain.   PLAN  We discussed vulvar pain syndrome.  Gabapentin 6% in neutral base apply tid.  Lidocaine ointment 5% apply tid prn.  Vaseline as needed.  FU in 6 weeks.    An After Visit Summary was printed and given to the patient.  __25____ minutes face to face  time of which over 50% was spent in counseling.

## 2019-03-11 ENCOUNTER — Telehealth: Payer: Self-pay | Admitting: Obstetrics and Gynecology

## 2019-03-11 ENCOUNTER — Encounter: Payer: Self-pay | Admitting: Obstetrics and Gynecology

## 2019-03-11 ENCOUNTER — Ambulatory Visit: Payer: Medicare Other | Admitting: Obstetrics and Gynecology

## 2019-03-11 ENCOUNTER — Other Ambulatory Visit: Payer: Self-pay

## 2019-03-11 VITALS — BP 116/82 | HR 86 | Temp 97.2°F | Resp 14 | Ht 59.25 in | Wt 127.4 lb

## 2019-03-11 DIAGNOSIS — R102 Pelvic and perineal pain: Secondary | ICD-10-CM

## 2019-03-11 MED ORDER — NONFORMULARY OR COMPOUNDED ITEM: 1 refills | Status: DC

## 2019-03-11 MED ORDER — LIDOCAINE 5 % EX OINT: 1.0000 "application " | TOPICAL_OINTMENT | Freq: Three times a day (TID) | CUTANEOUS | 1 refills | Status: DC

## 2019-03-11 NOTE — Telephone Encounter (Signed)
Patient says because of cost she does not want the compounded prescription.

## 2019-03-11 NOTE — Telephone Encounter (Signed)
Patient was seen today and only one of the three prescriptions were sent to her pharmacy. [facility].

## 2019-03-11 NOTE — Progress Notes (Signed)
Prescription for gabapentin topical cream faxed to [facility]. Fax #: [phone].

## 2019-03-11 NOTE — Telephone Encounter (Signed)
Spoke with patient. Advised RX for Compounded Gabapentin 6% topical cream and lidocaine ointment sent to [facility]. Patient states she thought that there was another Rx? Advised per review of Dr. Elza Rafter notes Vaseline was discussed.   Patient states her out of pocket cost for Gabapentin cream is $54. Patient states she would prefer to hold off on this RX for now and try Vaseline and lidocaine, will add gabapentin if no changes. Patient has confirmed both Rx are at [facility]. Advised patient I will update Dr. Quincy Simmonds and return call if any additional recommendations.   Routing to provider for final review. Patient is agreeable to disposition. Will close encounter.

## 2019-03-27 ENCOUNTER — Encounter: Payer: Self-pay | Admitting: *Deleted

## 2019-03-27 NOTE — Progress Notes (Addendum)
Bonnita Levan Key: [id number] - [place] Case ID: [id number] Need help? Call us at [phone]  Outcome  Approvedtoday  Request Reference Number: [id number]. BOTOX INJ 200UNIT is approved through 06/27/2019. For further questions, call [phone].  DrugBotox 200UNIT solution  Form[employer]umRx Medicare Part D Electronic Prior Authorization Form [id number] NCPDP) Bonnita Levan Key: [id number] - [place] Case ID: [id number] Need help? Call us at [phone]  Status  Sent to [employer]  DrugBotox 200UNIT solution  FormOptumRx Medicare Part D Electronic Prior Authorization Form [phone] NCPDP)

## 2019-04-04 ENCOUNTER — Other Ambulatory Visit: Payer: Self-pay

## 2019-04-04 ENCOUNTER — Ambulatory Visit (INDEPENDENT_AMBULATORY_CARE_PROVIDER_SITE_OTHER): Payer: Medicare Other | Admitting: Neurology

## 2019-04-04 DIAGNOSIS — G43709 Chronic migraine without aura, not intractable, without status migrainosus: Secondary | ICD-10-CM

## 2019-04-04 MED ORDER — ONABOTULINUMTOXINA 100 UNITS IJ SOLR
155.0000 [IU] | Freq: Once | INTRAMUSCULAR | Status: AC
  Administered 2019-04-04: 155 [IU] via INTRAMUSCULAR

## 2019-04-04 NOTE — Progress Notes (Signed)
[facility]   Procedure Note Botox  Attending: Dr. Kenyotta Dorfman  Preoperative Diagnosis(es): Chronic migraine  Consent obtained from: The patient Benefits discussed included, but were not limited to decreased muscle tightness, increased joint range of motion, and decreased pain.  Risk discussed included, but were not limited pain and discomfort, bleeding, bruising, excessive weakness, venous thrombosis, muscle atrophy and dysphagia.  Anticipated outcomes of the procedure as well as he risks and benefits of the alternatives to the procedure, and the roles and tasks of the personnel to be involved, were discussed with the patient, and the patient consents to the procedure and agrees to proceed. A copy of the patient medication guide was given to the patient which explains the blackbox warning.  Patients identity and treatment sites confirmed Yes.  Details of Procedure: Skin was cleaned with alcohol. Prior to injection, the needle plunger was aspirated to make sure the needle was not within a blood vessel.  There was no blood retrieved on aspiration.    Following is a summary of the muscles injected  And the amount of Botulinum toxin used:  Dilution 200 units of Botox was reconstituted with 4 ml of preservative free normal saline. Time of reconstitution: At the time of the office visit (<30 minutes prior to injection)   Injections  155 total units of Botox was injected with a 30 gauge needle.  Injection Sites: L occipitalis: 15 units- 3 sites  R occiptalis: 15 units- 3 sites  L upper trapezius: 15 units- 3 sites R upper trapezius: 15 units- 3 sits          L paraspinal: 10 units- 2 sites R paraspinal: 10 units- 2 sites  Face L frontalis(2 injection sites):10 units   R frontalis(2 injection sites):10 units         L corrugator: 5 units   R corrugator: 5 units           Procerus: 5 units   L temporalis: 20 units R temporalis: 20 units   Agent:  200 units of botulinum Type A  (Onobotulinum Toxin type A) was reconstituted with 4 ml of preservative free normal saline.  Time of reconstitution: At the time of the office visit (<30 minutes prior to injection)     Total injected (Units): 155  Total wasted (Units): 7.5  Patient tolerated procedure well without complications.   Reinjection is anticipated in 3 months.    

## 2019-04-04 NOTE — Progress Notes (Signed)
7.5 units wasted 

## 2019-04-23 ENCOUNTER — Ambulatory Visit: Payer: Medicare Other | Admitting: Obstetrics and Gynecology

## 2019-04-24 ENCOUNTER — Ambulatory Visit: Payer: Medicare Other

## 2019-04-24 ENCOUNTER — Telehealth: Payer: Self-pay | Admitting: Neurology

## 2019-04-24 NOTE — Telephone Encounter (Signed)
Pt coming in tomorrow for HA cocktail

## 2019-04-24 NOTE — Telephone Encounter (Signed)
Patient is experiencing a bad migraine and wanting to know if she can come in today or tomorrow and get a headache cocktail please. Thanks!

## 2019-04-25 ENCOUNTER — Other Ambulatory Visit: Payer: Self-pay

## 2019-04-25 ENCOUNTER — Ambulatory Visit (INDEPENDENT_AMBULATORY_CARE_PROVIDER_SITE_OTHER): Payer: Medicare Other | Admitting: *Deleted

## 2019-04-25 DIAGNOSIS — R51 Headache: Secondary | ICD-10-CM

## 2019-04-25 DIAGNOSIS — G43009 Migraine without aura, not intractable, without status migrainosus: Secondary | ICD-10-CM

## 2019-04-25 DIAGNOSIS — R519 Headache, unspecified: Secondary | ICD-10-CM

## 2019-04-25 DIAGNOSIS — G43709 Chronic migraine without aura, not intractable, without status migrainosus: Secondary | ICD-10-CM

## 2019-04-25 MED ORDER — KETOROLAC TROMETHAMINE 60 MG/2ML IM SOLN
60.0000 mg | Freq: Once | INTRAMUSCULAR | Status: AC
  Administered 2019-04-25: 60 mg via INTRAMUSCULAR

## 2019-04-25 MED ORDER — METOCLOPRAMIDE HCL 5 MG/ML IJ SOLN
10.0000 mg | Freq: Once | INTRAMUSCULAR | Status: AC
  Administered 2019-04-25: 10 mg via INTRAMUSCULAR

## 2019-04-25 MED ORDER — DIPHENHYDRAMINE HCL 50 MG/ML IJ SOLN
25.0000 mg | Freq: Once | INTRAMUSCULAR | Status: AC
  Administered 2019-04-25: 25 mg via INTRAMUSCULAR

## 2019-04-25 NOTE — Progress Notes (Signed)
Patient here for migraine cocktail. Has had headache for several weeks.  Has had before no allergies. Toradol 60 mg / Benadryl 25 mg / Reglan 10 mg  Has a friend to drive her home. Gave 36ml in left upper outer quad and 2 ml in right upper outer quad.  Tolerated well.

## 2019-04-28 ENCOUNTER — Telehealth: Payer: Self-pay | Admitting: Neurology

## 2019-04-28 NOTE — Telephone Encounter (Signed)
Patient is calling about still having a migraine. Patient came in for the headache cocktail on Friday and said she still is suffering from migraine and wanting to know what else she can do. Thanks!

## 2019-04-28 NOTE — Telephone Encounter (Signed)
Patient left message with the after hour service on 04-28-19 @ 12:02 pm   Returning a call

## 2019-04-28 NOTE — Telephone Encounter (Signed)
Called patient back and left message for her to return call regarding continued migraine.  She received Botox last injection 8/28 and had a migraine cocktail in the office last Friday 9/18.

## 2019-04-29 ENCOUNTER — Telehealth: Payer: Self-pay | Admitting: Neurology

## 2019-04-29 DIAGNOSIS — R519 Headache, unspecified: Secondary | ICD-10-CM

## 2019-04-29 DIAGNOSIS — G43709 Chronic migraine without aura, not intractable, without status migrainosus: Secondary | ICD-10-CM

## 2019-04-29 DIAGNOSIS — G43009 Migraine without aura, not intractable, without status migrainosus: Secondary | ICD-10-CM

## 2019-04-29 DIAGNOSIS — G43019 Migraine without aura, intractable, without status migrainosus: Secondary | ICD-10-CM

## 2019-04-29 NOTE — Telephone Encounter (Signed)
Spoke to patient and she had the migraine cocktail on Friday afternoon and said it helped a few hours but migraine was back Saturday am.  She had Botox last 8/28. Is taking Depakote ER 250 mg (had been taking only one at bedtime but past few nights has increased to two a bedtime and stated that didn't help). Only other pain med she has tried is Tylenol which didn't help. She is using ice to her head to try and get some relief.   She stated she has ongoing back issues (follow up with ortho today from steroid shot to back 3 weeks ago on Sept 3). She said the migraine has pretty much been since that date and she wonders if it were triggered by the steroid injection she had for her back.   Patient wanted to let Dr. Tomi Likens know and see if there is anything else she can do for this ongoing migraine.

## 2019-04-29 NOTE — Telephone Encounter (Addendum)
Patient called back and stated she forgot to tell me earlier (was "kind of asleep" when I called her earlier she said). Stated she forgot to tell me this am that her back had been hurting so much she was taking the tramadol 50 mg 1x daily. After the steroid shot in her back she increased it to 50mg  BID then 50 mg TID. Then she remembered Dr. Tomi Likens telling her about possible rebound headaches and she stopped taking the Tramadol about 2 weeks ago.   She feels that between the back steroid shot on Sept 3 and taking the Tramadol that led to this ongoing headache.  She is in a lot of pain with her back and has a 130 appt with ortho today to talk about plans. Offered her the pred taper and she didn't want to do that now until her ortho told her what the plan was. She said she had steroids in the back injection and is hesitant to take any additional ones at this time. (Did verify she has done fine with the pred taper for previous headaches)  She is aware MD wants her to continue taking the Depakote ER 500mg  at bedtime. Informed her it will not provide immediate relief but is preventative and will need around 6 weeks to see a difference.  She is going to call back after ortho appt and let me know if she does decide to do the pred taper - did not want called in at this time.   Dr. Tomi Likens - please see above about patient had been taking the tramadol also (she wanted to make sure you knew that).

## 2019-04-29 NOTE — Telephone Encounter (Signed)
I would continue Depakote ER 500mg  at bedtime, however it will not provide immediate relief.  It is a preventative and will need to take around 6 weeks before she may notice a difference (could be sooner).    We can prescribe her a prednisone taper (she has had it in the past).  Did she have any adverse reaction to prednisone in the past?  If agreeable, the we can prescribe 10mg  tablets:  60mg  on day 1, then 50mg  on day 2, then 40mg  on day 3, then 30mg  on day 4, then 20mg  on day 5, then 10mg  on day 6, then STOP (#21, refills 0).

## 2019-04-29 NOTE — Telephone Encounter (Signed)
Patient called in regarding a message she received from our office. She said yes please call into [facility] so she can pick it up tomorrow. Thank you

## 2019-04-30 MED ORDER — PREDNISONE 10 MG (21) PO TBPK: ORAL_TABLET | ORAL | 0 refills | Status: DC

## 2019-04-30 NOTE — Telephone Encounter (Signed)
Talked to patient who still has headache and wants the pred taper called in. Informed her instructions below and am sending it to [facility] now  You will take all 6 tablets the 1st day, all 5 tablets the 2nd day, all 4 tablets the 3rd day, all three tablets the 4th day, both tablets the 5th day, and the final tablet on the 6th day. Remember to not take any additional NSAIDS while taking the prednisone. If it upsets your stomach, may take OTC Pepcid.   Patient aware of all above. Sent into [facility].

## 2019-05-06 NOTE — Progress Notes (Signed)
Virtual Visit via Telephone Note The purpose of this virtual visit is to provide medical care while limiting exposure to the novel coronavirus.    Consent was obtained for phone visit:  yes Answered questions that patient had about telehealth interaction:  Yes I discussed the limitations, risks, security and privacy concerns of performing an evaluation and management service by telephone. I also discussed with the patient that there may be a patient responsible charge related to this service. The patient expressed understanding and agreed to proceed.  Pt location: Home Physician Location: office Name of referring provider:  Mila PalmerWolters, Sharon, MD I connected with .Whitney Bellowshristine H Peterson at patients initiation/request on 05/07/2019 at 10:10 AM EDT by telephone and verified that I am speaking with the correct person using two identifiers.  Pt MRN:  [id number] Pt DOB:  02/23/1951  History of Present Illness:  [name] is a 68 year old right-handed Caucasian woman with depression, anxiety and history of benzodiazepine and barbiturates dependence and gastric ulcer who follows up for migraines.  UPDATE: Restarted Botox on 01/03/2019.  Last injection was 04/04/2019.  She had an intractable migraine last week.  She was started on tramadol for arthritic low back pain and that is when the headaches started.  She stopped the tramadol.  She has been experiencing ongoing back pain.which has caused stress. Started on prednisone taper.  Intensity significantly reduced but still a mild ongoing headache.  She found out that her sister has Alzheimer's disease and is concerned if she may get it.    Prior to last week, she was doing well.   Current NSAIDS:contraindicated Current analgesics:Tylenol Current triptans:no Current ergotamine:no Current anti-emetic:no Current muscle relaxants:no Current anti-anxiolytic:Librium Current sleep aide:Librium Current Antihypertensive  medications:no Current Antidepressant medications:no Current Anticonvulsant medications:Depakote ER250mg  daily Current anti-CGRP:no Current Vitamins/Herbal/Supplements:D Current Antihistamines/Decongestants:no Other therapy:Botox Hormone:estradiol  Caffeine:1 cup 1/2 decaf coffee daily Alcohol:no Smoker:no Diet:hydrates Exercise:Stopped due to torn knee meniscus Depression:no; Anxiety:yes. Other pain:back pain Sleep hygiene:poor  CBC and CMP and TSH from February unremarkable except for Na 131.   HISTORY:  Onset: 2016.She has remote history of migraines which were controlled for many years. In December 2015, her husband unexpectedly passed away, which triggered depression and anxiety. She subsequently recovered. However, in early June2016, she witnessed her boyfriend's ill father pass away. It triggered flashbacks of her husband's death, which increased depression, anxiety and migraines. She has significant history of side effects to multiple medications. Location: Left peri-orbital Quality: pounding Initial Intensity: 10/10 Aura: no Prodrome: no Associated symptoms: Nausea, photophobia, phonophobia, osmophobia Initial Duration: All day Initial Frequency: 15 days per month Triggers: Emotional stress Relieving factors: Tramadol Activity: aggravates  Past NSAIDS: Oral NSAIDs contraindicated due to bleeding ulcer Past analgesics: Cannot take ASA products such as Excedrin due to bleeding ulcer Past abortive triptans: Relpax (took it multiple times but caused chest tightness once, but was effective), sumatriptan (many years ago) Past anti-emetic: Zofran 8mg  (effective) Past anti-anxiolytic: benzodiazepines Past antihypertensive medications: propranolol (chest tightness) Past antidepressant medications: Multiple, such as Cymbalta. She has had intolerance to multiple antidepressants over the years. Past anticonvulsant  medications: topiramate (chest tightness) Past anti-CGRP: Aimovig(constipation, nausea) Past vitamins/Herbal/Supplements: no  Family history of headache: Mom, sister  MRI and MRA of head from 11/05/10 were personally reviewed and were unremarkable.  Past Medical History: Past Medical History:  Diagnosis Date  . Abnormal Pap smear of cervix 07/2016   CIN1  . Constipation   . Depression   . Diverticulosis   . H. pylori infection   .  History of blood transfusion 1984  . Hx of colonic polyps   . Hypercholesteremia   . Hyponatremia   . Migraines   . Vitamin D deficiency     Medications: Outpatient Encounter Medications as of 05/07/2019  Medication Sig Note  . Calcium 600-200 MG-UNIT tablet Take 1 tablet by mouth daily.   . chlordiazePOXIDE (LIBRIUM) 10 MG capsule Take 10 mg by mouth at bedtime.    . divalproex (DEPAKOTE ER) 250 MG 24 hr tablet Take 2 tablets at bedtime. (Patient taking differently: Take 250 mg by mouth at bedtime. )   . docusate sodium (COLACE) 100 MG capsule Take 100 mg by mouth at bedtime.    . lidocaine (XYLOCAINE) 5 % ointment Apply 1 application topically 3 (three) times daily. Use as needed for pain in the vulva.   . NONFORMULARY OR COMPOUNDED ITEM Gabapentin 6% topical cream in neutral base.  Apply to the vulva tid.   Dispense:  30 grams,  RF one.   . nystatin-triamcinolone ointment (MYCOLOG) Apply 1 application topically 2 (two) times daily. Use for one week as needed.   . OnabotulinumtoxinA (BOTOX IJ)    . ondansetron (ZOFRAN) 4 MG tablet Take 1 tablet (4 mg total) by mouth every 8 (eight) hours as needed for nausea or vomiting.   . polyethylene glycol (MIRALAX / GLYCOLAX) packet Take 17 g by mouth at bedtime.    . predniSONE (STERAPRED UNI-PAK 21 TAB) 10 MG (21) TBPK tablet 10mg  tablets. Take 6 pills on day 1 ; 5 tabs on day 2; 4 tabs on day 3; 3 tabs on day 4;  2 tabs day 5; and final tablet on day 6 then STOP.   . Simethicone (PHAZYME MAXIMUM  STRENGTH) 250 MG CAPS Take 250 mg by mouth daily as needed (gas).   [name] XIIDRA 5 % SOLN Place 1 drop into both eyes 2 (two) times daily.    [name] acetaminophen (TYLENOL) 325 MG tablet Take 325 mg by mouth every 6 (six) hours as needed.    . lansoprazole (PREVACID) 30 MG capsule Take 30 mg by mouth 2 (two) times daily before a meal.  04/12/2017: Needs refill  . Omega-3 Fatty Acids (FISH OIL PO) Take 1 capsule by mouth daily.   . traMADol (ULTRAM) 50 MG tablet as needed. 05/05/2019: Not taking cause headache   No facility-administered encounter medications on file as of 05/07/2019.     Allergies: Allergies  Allergen Reactions  . Aspirin Other (See Comments)    Hx of bleeding stomach ulcer   . Crestor [Rosuvastatin Calcium] Itching  . Inderal [Propranolol] Other (See Comments)    Chest tightness  . Macrobid [Nitrofurantoin Monohyd Macro] Diarrhea  . Other     Other reaction(s): Other (See Comments) Cholesterol meds-multiple reactions to multiple meds  . Relpax [Eletriptan Hydrobromide] Other (See Comments)    Chest Tightness   . Salicylates Other (See Comments)    Told not to take due to hx of GI ulcer  . Topamax [Topiramate] Other (See Comments)    Chest tightness    Family History: Family History  Problem Relation Age of Onset  . Migraines Mother   . Migraines Sister   . Cancer Father        Prostate    Social History: Social History   Socioeconomic History  . Marital status: Widowed    Spouse name: Not on file  . Number of children: Not on file  . Years of education: Not on file  .  Highest education level: Not on file  Occupational History  . Occupation: LPN  Social Needs  . Financial resource strain: Not on file  . Food insecurity    Worry: Not on file    Inability: Not on file  . Transportation needs    Medical: Not on file    Non-medical: Not on file  Tobacco Use  . Smoking status: Never Smoker  . Smokeless tobacco: Never Used  Substance and Sexual Activity  .  Alcohol use: No  . Drug use: No  . Sexual activity: Not Currently    Birth control/protection: Post-menopausal, Surgical    Comment: Hyst  Lifestyle  . Physical activity    Days per week: Not on file    Minutes per session: Not on file  . Stress: Not on file  Relationships  . Social Musician on phone: Not on file    Gets together: Not on file    Attends religious service: Not on file    Active member of club or organization: Not on file    Attends meetings of clubs or organizations: Not on file    Relationship status: Not on file  . Intimate partner violence    Fear of current or ex partner: Not on file    Emotionally abused: Not on file    Physically abused: Not on file    Forced sexual activity: Not on file  Other Topics Concern  . Not on file  Social History Narrative   Pt has L.P.N through [facility]    Observations/Objective:   Last menstrual period 08/07/2001. No acute distress.  Alert and oriented.  Speech fluent and not dysarthric.  Language intact.  Eyes orthophoric on primary gaze.  Face symmetric.  Assessment and Plan:   Migraine without aura, without status migrainosus, intractable.  Previously well-controlled.  Current intractable headache triggered by pain and tramadol.    No change in management at this time.  I am hopeful that her current headache, which is now dull, will continue to dissipate.  I think she really needs to address her anxiety and pain management.    1.  Follow up for Botox in December 2.  Depakote ER 250mg  daily    Follow Up Instructions:    -I discussed the assessment and treatment plan with the patient. The patient was provided an opportunity to ask questions and all were answered. The patient agreed with the plan and demonstrated an understanding of the instructions.   The patient was advised to call back or seek an in-person evaluation if the symptoms worsen or if the condition fails to improve as anticipated.    Total Time  spent in visit with the patient was:  16 minutes   , DO

## 2019-05-07 ENCOUNTER — Other Ambulatory Visit: Payer: Self-pay

## 2019-05-07 ENCOUNTER — Telehealth (INDEPENDENT_AMBULATORY_CARE_PROVIDER_SITE_OTHER): Payer: Medicare Other | Admitting: Neurology

## 2019-05-07 DIAGNOSIS — G43009 Migraine without aura, not intractable, without status migrainosus: Secondary | ICD-10-CM | POA: Diagnosis not present

## 2019-05-07 DIAGNOSIS — G43019 Migraine without aura, intractable, without status migrainosus: Secondary | ICD-10-CM

## 2019-05-19 DIAGNOSIS — M47816 Spondylosis without myelopathy or radiculopathy, lumbar region: Secondary | ICD-10-CM | POA: Insufficient documentation

## 2019-05-20 ENCOUNTER — Other Ambulatory Visit: Payer: Self-pay

## 2019-05-20 NOTE — Progress Notes (Signed)
68 y.o. 132P1002 Widowed Caucasian female here for annual exam.    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  Doing injections.  Seeing pain management for her back.  She has already done an epidural.  She states the Mycolog II and Lidocaine ointment cream helps the discomfort on her vulva.  She has not tried the Gabapentin compounded cream.   PCP:  Mila PalmerSharon Wolters, MD   Patient's last menstrual period was 08/07/2001 (approximate).           Sexually active: No.  The current method of family planning is post menopausal status/Hysterectomy.  Normal cervix on her final pathology. Exercising: No.  not currently due to arthritic knees Smoker:  no  Health Maintenance: Pap: 05-20-18 Neg, 01-17-17 Neg, 07-17-16 LGSIL:Neg HR HPV History of abnormal Pap: Yes, 07-17-16 LGSIL:Neg HR HPV MMG: 07-29-18 Neg/density B/Birads1 Colonoscopy: Recently completed IFOB -pending;colonoscopy 5-6 years ago--benign polyp BMD: 08-29-17 Result :Osteopenia TDaP: up to date--PCP Gardasil:   n/a HIV:01-17-17 NR Hep C:01-17-17 Neg Screening Labs:  PCP.  Flu vaccine completed.    reports that she has never smoked. She has never used smokeless tobacco. She reports that she does not drink alcohol or use drugs.  Past Medical History:  Diagnosis Date  . Abnormal Pap smear of cervix 07/2016   CIN1  . 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 Constipation   . Depression   . Diverticulosis   . H. pylori infection   . History of blood transfusion 1984  . Hx of colonic polyps   . Hypercholesteremia   . Hyponatremia   . Migraines   . Vitamin D deficiency     Past Surgical History:  Procedure Laterality Date  . ABDOMINAL HYSTERECTOMY    . ANTERIOR AND POSTERIOR REPAIR N/A 04/24/2017   Procedure: ANTERIOR (CYSTOCELE) AND POSTERIOR (RECTOCELE)  REPAIR;  Surgeon: Patton SallesAmundson C Silva, Lorilee Cafarella E, MD;  Location: [facility];  Service: Gynecology;  Laterality: N/A;  . CESAREAN SECTION  1984  . COLONOSCOPY  2012  .  COSMETIC SURGERY     Neck lift  . CYSTOSCOPY N/A 04/24/2017   Procedure: CYSTOSCOPY;  Surgeon: Patton SallesAmundson C Silva, Kathryne Ramella E, MD;  Location: [facility];  Service: Gynecology;  Laterality: N/A;  . HEMORRHOID SURGERY  1984  . LAPAROSCOPIC LYSIS OF ADHESIONS N/A 04/24/2017   Procedure: EXTENSIVE LAPAROSCOPIC LYSIS OF ADHESIONS;  Surgeon: Patton SallesAmundson C Silva, Aggie Douse E, MD;  Location: [facility];  Service: Gynecology;  Laterality: N/A;  . LAPAROSCOPIC VAGINAL HYSTERECTOMY WITH SALPINGO OOPHORECTOMY Bilateral 04/24/2017   Procedure: LAPAROSCOPIC ASSISTED VAGINAL HYSTERECTOMY WITH SALPINGO OOPHORECTOMY and pelvic washings;  Surgeon: Patton SallesAmundson C Silva, Irving Lubbers E, MD;  Location: [facility];  Service: Gynecology;  Laterality: Bilateral;  3 hours  . RECTAL PROLAPSE REPAIR     Dr. Kendrick Ranchim Davis  . UMBILICAL HERNIA REPAIR     had surgery as a child.    Current Outpatient Medications  Medication Sig Dispense Refill  . acetaminophen (TYLENOL) 325 MG tablet Take 325 mg by mouth every 6 (six) hours as needed.     . Calcium 600-200 MG-UNIT tablet Take 1 tablet by mouth daily.    . chlordiazePOXIDE (LIBRIUM) 10 MG capsule Take 10 mg by mouth at bedtime.     . divalproex (DEPAKOTE ER) 250 MG 24 hr tablet Take 2 tablets at bedtime. (Patient taking differently: Take 250 mg by mouth at bedtime. ) 60 tablet 3  . docusate sodium (COLACE) 100 MG capsule Take 100 mg by  mouth at bedtime.     . lansoprazole (PREVACID) 30 MG capsule Take 30 mg by mouth 2 (two) times daily before a meal.     . lidocaine (XYLOCAINE) 5 % ointment Apply 1 application topically 3 (three) times daily. Use as needed for pain in the vulva. 1.25 g 1  . nystatin-triamcinolone ointment (MYCOLOG) Apply 1 application topically 2 (two) times daily. Use for one week as needed. 30 g 0  . OnabotulinumtoxinA (BOTOX IJ)     . ondansetron (ZOFRAN) 4 MG tablet Take 1 tablet (4 mg total) by mouth every 8 (eight) hours as needed for nausea or vomiting. 20 tablet 0  . polyethylene glycol  (MIRALAX / GLYCOLAX) packet Take 17 g by mouth at bedtime.     . Simethicone (PHAZYME MAXIMUM STRENGTH) 250 MG CAPS Take 250 mg by mouth daily as needed (gas).    [name] XIIDRA 5 % SOLN Place 1 drop into both eyes 2 (two) times daily.     . NONFORMULARY OR COMPOUNDED ITEM Gabapentin 6% topical cream in neutral base.  Apply to the vulva tid.   Dispense:  30 grams,  RF one. (Patient not taking: Reported on 05/22/2019) 60 each 1   No current facility-administered medications for this visit.     Family History  Problem Relation Age of Onset  . Migraines Mother   . Migraines Sister   . Cancer Father        Prostate    Review of Systems  All other systems reviewed and are negative.   Exam:   BP 136/82   Pulse 88   Temp 97.6 F (36.4 C) (Temporal)   Resp (!) 22   Ht 5' (1.524 m)   Wt 135 lb 9.6 oz (61.5 kg)   LMP 08/07/2001 (Approximate)   BMI 26.48 kg/m     General appearance: alert, cooperative and appears stated age Head: normocephalic, without obvious abnormality, atraumatic Neck: no adenopathy, supple, symmetrical, trachea midline and thyroid normal to inspection and palpation Lungs: clear to auscultation bilaterally Breasts: normal appearance, no masses or tenderness, No nipple retraction or dimpling, No nipple discharge or bleeding, No axillary adenopathy Heart: regular rate and rhythm Abdomen: soft, non-tender; no masses, no organomegaly Extremities: extremities normal, atraumatic, no cyanosis or edema Skin: skin color, texture, turgor normal. No rashes or lesions Lymph nodes: cervical, supraclavicular, and axillary nodes normal. Neurologic: grossly normal  Pelvic: External genitalia:  no lesions              No abnormal inguinal nodes palpated.              Urethra:  normal appearing urethra with no masses, tenderness or lesions              Bartholins and Skenes: normal                 Vagina: normal appearing vagina with normal color and discharge, no lesions               Cervix: absent              Pap taken: No. Bimanual Exam:  Uterus:  absent              Adnexa: no mass, fullness, tenderness              Rectal exam: Yes.  .  Confirms.              Anus:  normal sphincter tone, no lesions  Chaperone was present for exam.  Assessment:   Well woman visit with normal exam. Status post LAVH/BSO/anterior and posterior colporrhaphy. Osteopenia.  Hx vulvar pain.   Plan: Mammogram screening discussed. Self breast awareness reviewed. Pap and HR HPV as above. Guidelines for Calcium, Vitamin D, regular exercise program including cardiovascular and weight bearing exercise. She will fill her Rx for Gabapentin.  Refill given for Mycolog II.  She has a refill of Lidocaine already.  BMD and mammogram in January, 2021. Follow up annually and prn.   After visit summary provided.

## 2019-05-22 ENCOUNTER — Telehealth: Payer: Self-pay | Admitting: Obstetrics and Gynecology

## 2019-05-22 ENCOUNTER — Encounter: Payer: Self-pay | Admitting: Obstetrics and Gynecology

## 2019-05-22 ENCOUNTER — Ambulatory Visit (INDEPENDENT_AMBULATORY_CARE_PROVIDER_SITE_OTHER): Payer: Medicare Other | Admitting: Obstetrics and Gynecology

## 2019-05-22 ENCOUNTER — Other Ambulatory Visit: Payer: Self-pay

## 2019-05-22 VITALS — BP 136/82 | HR 88 | Temp 97.6°F | Resp 22 | Ht 60.0 in | Wt 135.6 lb

## 2019-05-22 DIAGNOSIS — Z780 Asymptomatic menopausal state: Secondary | ICD-10-CM

## 2019-05-22 DIAGNOSIS — M8580 Other specified disorders of bone density and structure, unspecified site: Secondary | ICD-10-CM

## 2019-05-22 DIAGNOSIS — Z01419 Encounter for gynecological examination (general) (routine) without abnormal findings: Secondary | ICD-10-CM | POA: Diagnosis not present

## 2019-05-22 MED ORDER — NYSTATIN-TRIAMCINOLONE 100000-0.1 UNIT/GM-% EX OINT: 1.0000 "application " | TOPICAL_OINTMENT | Freq: Two times a day (BID) | CUTANEOUS | 0 refills | Status: DC

## 2019-05-22 NOTE — Patient Instructions (Addendum)
Start using your Gabapentin on the vulva three times daily to treat nerve pain.   Use the Mycolog (Nystatin and Triamcinolone) two times a day for red and itchy skin of the vulva.   Use the Lidocaine ointment three times a day for a rescue medication of the vulva if burning is occurring and you need quick relief.   EXERCISE AND DIET:  We recommended that you start or continue a regular exercise program for good health. Regular exercise means any activity that makes your heart beat faster and makes you sweat.  We recommend exercising at least 30 minutes per day at least 3 days a week, preferably 4 or 5.  We also recommend a diet low in fat and sugar.  Inactivity, poor dietary choices and obesity can cause diabetes, heart attack, stroke, and kidney damage, among others.    ALCOHOL AND SMOKING:  Women should limit their alcohol intake to no more than 7 drinks/beers/glasses of wine (combined, not each!) per week. Moderation of alcohol intake to this level decreases your risk of breast cancer and liver damage. And of course, no recreational drugs are part of a healthy lifestyle.  And absolutely no smoking or even second hand smoke. Most people know smoking can cause heart and lung diseases, but did you know it also contributes to weakening of your bones? Aging of your skin?  Yellowing of your teeth and nails?  CALCIUM AND VITAMIN D:  Adequate intake of calcium and Vitamin D are recommended.  The recommendations for exact amounts of these supplements seem to change often, but generally speaking 600 mg of calcium (either carbonate or citrate) and 800 units of Vitamin D per day seems prudent. Certain women may benefit from higher intake of Vitamin D.  If you are among these women, your doctor will have told you during your visit.    PAP SMEARS:  Pap smears, to check for cervical cancer or precancers,  have traditionally been done yearly, although recent scientific advances have shown that most women can have  pap smears less often.  However, every woman still should have a physical exam from her gynecologist every year. It will include a breast check, inspection of the vulva and vagina to check for abnormal growths or skin changes, a visual exam of the cervix, and then an exam to evaluate the size and shape of the uterus and ovaries.  And after 67 years of age, a rectal exam is indicated to check for rectal cancers. We will also provide age appropriate advice regarding health maintenance, like when you should have certain vaccines, screening for sexually transmitted diseases, bone density testing, colonoscopy, mammograms, etc.   MAMMOGRAMS:  All women over 67 years old should have a yearly mammogram. Many facilities now offer a "3D" mammogram, which may cost around $50 extra out of pocket. If possible,  we recommend you accept the option to have the 3D mammogram performed.  It both reduces the number of women who will be called back for extra views which then turn out to be normal, and it is better than the routine mammogram at detecting truly abnormal areas.    COLONOSCOPY:  Colonoscopy to screen for colon cancer is recommended for all women at age 67.  We know, you hate the idea of the prep.  We agree, BUT, having colon cancer and not knowing it is worse!!  Colon cancer so often starts as a polyp that can be seen and removed at colonscopy, which can quite literally save  your life!  And if your first colonoscopy is normal and you have no family history of colon cancer, most women don't have to have it again for 10 years.  Once every ten years, you can do something that may end up saving your life, right?  We will be happy to help you get it scheduled when you are ready.  Be sure to check your insurance coverage so you understand how much it will cost.  It may be covered as a preventative service at no cost, but you should check your particular policy.

## 2019-05-22 NOTE — Telephone Encounter (Signed)
Patient needs an order placed to be able to schedule bone density test at [facility].

## 2019-05-22 NOTE — Telephone Encounter (Signed)
Per review of Epic order placed for BMD at [facility] today.   Call placed to patient, left detailed message, ok per dpr. Advised as seen abpve. Advised patient to contact [facility] directly at [phone] to schedule.    Return call to office if any additional questions/concerns.   Encounter closed.

## 2019-06-13 ENCOUNTER — Other Ambulatory Visit: Payer: Self-pay | Admitting: Obstetrics and Gynecology

## 2019-06-13 DIAGNOSIS — Z1231 Encounter for screening mammogram for malignant neoplasm of breast: Secondary | ICD-10-CM

## 2019-06-24 DIAGNOSIS — M5136 Other intervertebral disc degeneration, lumbar region: Secondary | ICD-10-CM | POA: Insufficient documentation

## 2019-07-02 DIAGNOSIS — M25551 Pain in right hip: Secondary | ICD-10-CM | POA: Insufficient documentation

## 2019-07-02 NOTE — Progress Notes (Signed)
intiated BV on botxone for patient BOTOX and PA FORM will be attached to verification. Will submit PA form once verification has been made.  Waiting on response via fax.

## 2019-07-02 NOTE — Progress Notes (Signed)
SUBMITTED PA via covermymeds today waiting response.

## 2019-07-07 NOTE — Progress Notes (Signed)
Botox 200 units was approved through 10/02/2019.  Ref # [id number].

## 2019-07-11 ENCOUNTER — Other Ambulatory Visit: Payer: Self-pay

## 2019-07-11 ENCOUNTER — Ambulatory Visit (INDEPENDENT_AMBULATORY_CARE_PROVIDER_SITE_OTHER): Payer: Medicare Other | Admitting: Neurology

## 2019-07-11 DIAGNOSIS — G43709 Chronic migraine without aura, not intractable, without status migrainosus: Secondary | ICD-10-CM | POA: Diagnosis not present

## 2019-07-11 MED ORDER — ONABOTULINUMTOXINA 100 UNITS IJ SOLR
155.0000 [IU] | Freq: Once | INTRAMUSCULAR | Status: AC
  Administered 2019-07-11: 155 [IU] via INTRAMUSCULAR

## 2019-07-11 NOTE — Progress Notes (Signed)
Botulinum Clinic   Procedure Note Botox  Attending: Dr. Adam Jaffe  Preoperative Diagnosis(es): Chronic migraine  Consent obtained from: The patient Benefits discussed included, but were not limited to decreased muscle tightness, increased joint range of motion, and decreased pain.  Risk discussed included, but were not limited pain and discomfort, bleeding, bruising, excessive weakness, venous thrombosis, muscle atrophy and dysphagia.  Anticipated outcomes of the procedure as well as he risks and benefits of the alternatives to the procedure, and the roles and tasks of the personnel to be involved, were discussed with the patient, and the patient consents to the procedure and agrees to proceed. A copy of the patient medication guide was given to the patient which explains the blackbox warning.  Patients identity and treatment sites confirmed Yes.  .  Details of Procedure: Skin was cleaned with alcohol. Prior to injection, the needle plunger was aspirated to make sure the needle was not within a blood vessel.  There was no blood retrieved on aspiration.    Following is a summary of the muscles injected  And the amount of Botulinum toxin used:  Dilution 200 units of Botox was reconstituted with 4 ml of preservative free normal saline. Time of reconstitution: At the time of the office visit (<30 minutes prior to injection)   Injections  155 total units of Botox was injected with a 30 gauge needle.  Injection Sites: L occipitalis: 15 units- 3 sites  R occiptalis: 15 units- 3 sites  L upper trapezius: 15 units- 3 sites R upper trapezius: 15 units- 3 sits          L paraspinal: 10 units- 2 sites R paraspinal: 10 units- 2 sites  Face L frontalis(2 injection sites):10 units   R frontalis(2 injection sites):10 units         L corrugator: 5 units   R corrugator: 5 units           Procerus: 5 units   L temporalis: 20 units R temporalis: 20 units   Agent:  200 units of botulinum Type  A (Onobotulinum Toxin type A) was reconstituted with 4 ml of preservative free normal saline.  Time of reconstitution: At the time of the office visit (<30 minutes prior to injection)     Total injected (Units): 155  Total wasted (Units): 1  Patient tolerated procedure well without complications.   Reinjection is anticipated in 3 months. Return to clinic in 4 1/2 months.   

## 2019-07-23 NOTE — Progress Notes (Signed)
(  Key: [place]) Emgality 120MG /ML auto-injectors (migraine)   Wait for Determination Please wait for [employer] 2017 NCPDP to return a determination.

## 2019-07-25 NOTE — Progress Notes (Signed)
Received determination status for patient Emgality from [employer]  Status of request: Approve  Approve through 08/06/2020 under medicare part D  Ref# [id number] [phone]

## 2019-08-04 ENCOUNTER — Other Ambulatory Visit: Payer: Self-pay | Admitting: Neurology

## 2019-08-19 ENCOUNTER — Telehealth: Payer: Self-pay | Admitting: Neurology

## 2019-08-19 NOTE — Telephone Encounter (Signed)
Patient is calling in to make sure that her new insurance is used for her Botox in March. She now has the [employer]. It has been updated in the system. Thanks!

## 2019-08-19 NOTE — Telephone Encounter (Signed)
Please see

## 2019-08-26 ENCOUNTER — Other Ambulatory Visit: Payer: Self-pay | Admitting: Neurology

## 2019-08-29 ENCOUNTER — Ambulatory Visit: Payer: Medicare PPO | Attending: Internal Medicine

## 2019-08-29 DIAGNOSIS — Z23 Encounter for immunization: Secondary | ICD-10-CM | POA: Insufficient documentation

## 2019-08-29 NOTE — Progress Notes (Signed)
   Covid-19 Vaccination Clinic  Name:  [name]    MRN: [id number] DOB: Jul 25, 1951  08/29/2019  [name] was observed post Covid-19 immunization for 15 minutes without incidence. She was provided with Vaccine Information Sheet and instruction to access the V-Safe system.   [name] was instructed to call 911 with any severe reactions post vaccine: [name] Difficulty breathing  . Swelling of your face and throat  . A fast heartbeat  . A bad rash all over your body  . Dizziness and weakness    Immunizations Administered    Name Date Dose VIS Date Route   Pfizer COVID-19 Vaccine 08/29/2019 10:15 AM 0.3 mL 07/18/2019 Intramuscular   Manufacturer: [employer], [employer]   Lot: [id number]   NDC: [address]-0

## 2019-09-05 ENCOUNTER — Ambulatory Visit: Payer: Medicare Other

## 2019-09-05 ENCOUNTER — Other Ambulatory Visit: Payer: Medicare Other

## 2019-09-19 ENCOUNTER — Ambulatory Visit: Payer: Medicare PPO | Attending: Internal Medicine

## 2019-09-19 DIAGNOSIS — Z23 Encounter for immunization: Secondary | ICD-10-CM | POA: Insufficient documentation

## 2019-09-19 NOTE — Progress Notes (Signed)
   Covid-19 Vaccination Clinic  Name:  [name]    MRN: [id number] DOB: Jun 23, 1951  09/19/2019  [name] was observed post Covid-19 immunization for 15 minutes without incidence. She was provided with Vaccine Information Sheet and instruction to access the V-Safe system.   [name] was instructed to call 911 with any severe reactions post vaccine: [name] Difficulty breathing  . Swelling of your face and throat  . A fast heartbeat  . A bad rash all over your body  . Dizziness and weakness    Immunizations Administered    Name Date Dose VIS Date Route   Pfizer COVID-19 Vaccine 09/19/2019 11:26 AM 0.3 mL 07/18/2019 Intramuscular   Manufacturer: [employer], [employer]   Lot: [id number]   NDC: [address]-1

## 2019-09-25 ENCOUNTER — Ambulatory Visit: Payer: Medicare Other

## 2019-09-29 ENCOUNTER — Telehealth: Payer: Self-pay | Admitting: *Deleted

## 2019-09-29 NOTE — Telephone Encounter (Signed)
LMOM to call me back with phone number on back of card so I may proceed with verifying benefits and prior authorization of Botox coming up in March.

## 2019-09-30 ENCOUNTER — Other Ambulatory Visit: Payer: Self-pay | Admitting: Neurology

## 2019-09-30 NOTE — Telephone Encounter (Signed)
Patient called to give [name] her [facility] contact # [phone].  Number on back of card because we do not have a scan of card in her chart.

## 2019-10-01 ENCOUNTER — Encounter: Payer: Self-pay | Admitting: *Deleted

## 2019-10-01 NOTE — Progress Notes (Addendum)
[name] Key: [id number] - PA Case ID: [id number] Need help? Call us at [phone] Outcome Approvedtoday PA Case: [id number], Status: Approved, Coverage Starts on: 10/01/2019 12:00:00 AM, Coverage Ends on: 08/06/2020 12:00:00 AM. Questions? Contact [phone]. Drug Botox 100UNIT solution Form Merchandiser, retail and Medical Benefit PA Form  Approval fax sent to scan into her chart

## 2019-10-24 ENCOUNTER — Other Ambulatory Visit: Payer: Self-pay

## 2019-10-24 ENCOUNTER — Ambulatory Visit (INDEPENDENT_AMBULATORY_CARE_PROVIDER_SITE_OTHER): Payer: Medicare PPO | Admitting: Neurology

## 2019-10-24 DIAGNOSIS — G43709 Chronic migraine without aura, not intractable, without status migrainosus: Secondary | ICD-10-CM | POA: Diagnosis not present

## 2019-10-24 MED ORDER — ONABOTULINUMTOXINA 100 UNITS IJ SOLR
160.0000 [IU] | Freq: Once | INTRAMUSCULAR | Status: AC
  Administered 2019-10-24: 155 [IU] via INTRAMUSCULAR

## 2019-10-24 NOTE — Progress Notes (Signed)
Botulinum Clinic  ° °Procedure Note Botox ° °Attending: Dr. Kennis Buell ° °Preoperative Diagnosis(es): Chronic migraine ° °Consent obtained from: The patient °Benefits discussed included, but were not limited to decreased muscle tightness, increased joint range of motion, and decreased pain.  Risk discussed included, but were not limited pain and discomfort, bleeding, bruising, excessive weakness, venous thrombosis, muscle atrophy and dysphagia.  Anticipated outcomes of the procedure as well as he risks and benefits of the alternatives to the procedure, and the roles and tasks of the personnel to be involved, were discussed with the patient, and the patient consents to the procedure and agrees to proceed. A copy of the patient medication guide was given to the patient which explains the blackbox warning. ° °Patients identity and treatment sites confirmed Yes.  . ° °Details of Procedure: °Skin was cleaned with alcohol. Prior to injection, the needle plunger was aspirated to make sure the needle was not within a blood vessel.  There was no blood retrieved on aspiration.   ° °Following is a summary of the muscles injected  And the amount of Botulinum toxin used: ° °Dilution °200 units of Botox was reconstituted with 4 ml of preservative free normal saline. °Time of reconstitution: At the time of the office visit (<30 minutes prior to injection)  ° °Injections  °155 total units of Botox was injected with a 30 gauge needle. ° °Injection Sites: °L occipitalis: 15 units- 3 sites  °R occiptalis: 15 units- 3 sites ° °L upper trapezius: 15 units- 3 sites °R upper trapezius: 15 units- 3 sits          °L paraspinal: 10 units- 2 sites °R paraspinal: 10 units- 2 sites ° °Face °L frontalis(2 injection sites):10 units   °R frontalis(2 injection sites):10 units         °L corrugator: 5 units   °R corrugator: 5 units           °Procerus: 5 units   °L temporalis: 20 units °R temporalis: 20 units  ° °Agent:  °200 units of botulinum Type  A (Onobotulinum Toxin type A) was reconstituted with 4 ml of preservative free normal saline.  °Time of reconstitution: At the time of the office visit (<30 minutes prior to injection)  ° ° ° Total injected (Units):  155 ° Total wasted (Units):  5 ° °Patient tolerated procedure well without complications.   °Reinjection is anticipated in 3 months. ° ° °

## 2019-10-27 ENCOUNTER — Other Ambulatory Visit: Payer: Self-pay | Admitting: Neurology

## 2019-10-28 ENCOUNTER — Other Ambulatory Visit: Payer: Self-pay | Admitting: Neurology

## 2019-10-28 MED ORDER — PROMETHAZINE HCL 25 MG PO TABS: ORAL_TABLET | ORAL | 5 refills | Status: DC

## 2019-10-30 ENCOUNTER — Other Ambulatory Visit: Payer: Self-pay | Admitting: Neurology

## 2019-10-30 MED ORDER — ZONISAMIDE 25 MG PO CAPS: ORAL_CAPSULE | ORAL | 0 refills | Status: DC

## 2019-10-30 MED ORDER — UBRELVY 100 MG PO TABS: 100.0000 mg | ORAL_TABLET | ORAL | 0 refills | Status: DC

## 2019-10-31 ENCOUNTER — Telehealth: Payer: Self-pay | Admitting: Neurology

## 2019-10-31 ENCOUNTER — Other Ambulatory Visit: Payer: Self-pay | Admitting: Neurology

## 2019-10-31 MED ORDER — UBRELVY 100 MG PO TABS: 1.0000 | ORAL_TABLET | ORAL | 3 refills | Status: DC | PRN

## 2019-10-31 NOTE — Telephone Encounter (Signed)
Patient called and said she had some samples of Ubrelvy 100 MG and it worked well. Patient is now requesting a prescription.  [employer] on [address]

## 2019-10-31 NOTE — Telephone Encounter (Signed)
Prescription has been sent.

## 2019-11-11 ENCOUNTER — Ambulatory Visit: Payer: Medicare Other

## 2019-11-11 ENCOUNTER — Other Ambulatory Visit: Payer: Medicare Other

## 2019-11-11 DIAGNOSIS — M47816 Spondylosis without myelopathy or radiculopathy, lumbar region: Secondary | ICD-10-CM | POA: Diagnosis not present

## 2019-11-11 DIAGNOSIS — G894 Chronic pain syndrome: Secondary | ICD-10-CM | POA: Diagnosis not present

## 2019-11-17 ENCOUNTER — Encounter: Payer: Self-pay | Admitting: *Deleted

## 2019-11-17 ENCOUNTER — Telehealth: Payer: Self-pay

## 2019-11-17 ENCOUNTER — Telehealth: Payer: Self-pay | Admitting: Neurology

## 2019-11-17 NOTE — Progress Notes (Signed)
Patient initiated the appeal herself. [employer] rep - Gay Filler- called and asked to do the appeal on the phone Case# [id number] Answered the questions and determination should be made between 24-72 hours.

## 2019-11-17 NOTE — Telephone Encounter (Signed)
[place]

## 2019-11-17 NOTE — Telephone Encounter (Signed)
Good Afternoon Mrs. [name], My Name is Zenon Mayo, I work with Dr. Everlena Cooper. I Spoke with [employer] and your insurance will cover the medication but the Copay for 10 tabs is $100.00. I will let Dr. Everlena Cooper now this a well to see what we can do. In the mean time the rep stated try calling your insurance to see if there is a cheaper medication in a lower Tier.   Message sent to pt through Mychart.

## 2019-11-17 NOTE — Telephone Encounter (Signed)
Patient called to let Sheena know [employer] will be reaching out to her about Ulbrevy 100 MG.

## 2019-11-25 DIAGNOSIS — M47816 Spondylosis without myelopathy or radiculopathy, lumbar region: Secondary | ICD-10-CM | POA: Diagnosis not present

## 2019-11-26 ENCOUNTER — Telehealth: Payer: Self-pay | Admitting: Neurology

## 2019-11-26 ENCOUNTER — Other Ambulatory Visit: Payer: Self-pay

## 2019-11-26 NOTE — Telephone Encounter (Signed)
Patient is requesting an alternative to Ubrelvy 100 MG because of an insurance Rx denial on the [place].  [employer] on [address]

## 2019-11-26 NOTE — Telephone Encounter (Signed)
At this point, we can prescribe her sumatriptan 50mg .  Take 1 tablet earliest onset of migraine, may repeat in 2 hours if needed.  Maximum 2 tablets in 24 hours.

## 2019-11-26 NOTE — Telephone Encounter (Signed)
I am calling her insurance to find out status of appeal completed 11/17/2019

## 2019-11-27 ENCOUNTER — Other Ambulatory Visit: Payer: Self-pay | Admitting: Neurology

## 2019-11-27 MED ORDER — ZONISAMIDE 50 MG PO CAPS: 50.0000 mg | ORAL_CAPSULE | Freq: Every day | ORAL | 5 refills | Status: DC

## 2019-11-27 NOTE — Telephone Encounter (Signed)
Called [phone] spoke with Cici to check status of her appeal for Ubrelvy. Cici said it was not noted as an appeal but rather marked by [facility] that it was aborted and we can go about this in 2 ways.  First we re-initiated a new appeal (1st level appeal) over the phone and need to call to check status after 72 hours.  Call [phone] Case reference # [id number] Fax clinicals to [phone]  AND  request a Tier reduction call 19hours later for determination Case reference # [id number] Phone for status check [phone] Fax clinicals to [phone]  [facility] [phone] and spoke with [facility] she said she needed to transfer me to a dedicated group for this patient.  Spoke with Toni Amend and had to hang up because recording of survey continued and we could not hear each other I gave her my direct line to call me back. She never did

## 2019-11-27 NOTE — Progress Notes (Signed)
Refill zonisamide 50mg  daily

## 2019-11-28 NOTE — Telephone Encounter (Signed)
Received letter of approval from the [facility] and Appeal Department Valid 08/07/2018-08/06/2021 Sent to scan into her chart Called patient to let her know

## 2019-12-01 ENCOUNTER — Encounter: Payer: Self-pay | Admitting: Neurology

## 2019-12-01 ENCOUNTER — Ambulatory Visit (INDEPENDENT_AMBULATORY_CARE_PROVIDER_SITE_OTHER): Payer: Medicare PPO | Admitting: Neurology

## 2019-12-01 ENCOUNTER — Other Ambulatory Visit: Payer: Self-pay

## 2019-12-01 VITALS — BP 139/93 | HR 88 | Resp 18 | Ht 63.0 in | Wt 140.0 lb

## 2019-12-01 DIAGNOSIS — G43709 Chronic migraine without aura, not intractable, without status migrainosus: Secondary | ICD-10-CM | POA: Diagnosis not present

## 2019-12-01 DIAGNOSIS — R413 Other amnesia: Secondary | ICD-10-CM

## 2019-12-01 NOTE — Progress Notes (Signed)
NEUROLOGY FOLLOW UP OFFICE NOTE  [name] [id number]  HISTORY OF PRESENT ILLNESS: [name] is a 69 year old right-handed Caucasian woman with depression, anxiety and history of benzodiazepine and barbiturates dependence and gastric ulcer who follows up for migraines.  UPDATE: She was started on tramadol back in September for chronic arthritic lower back pain.  Since then, she has had uncontrolled migraines.  In addition to Botox, I wanted to start Emgality but her insurance wouldn't approve treatment of both Botox and Emgality.  She was started on zonisamide last month and Depakote discontinued.  She was prescribed Bernita Raisin, which was effective.   Intensity:  severe Duration:  2 hours Frequency:  Was having migraines every 3 days but hasn't had a migraine in a week. Current NSAIDS:contraindicated Current analgesics:Tramadol; Tylenol Current triptans:no Current ergotamine:no Current anti-emetic:Promethazine 25mg -50mg ; Zofran 4mg  Current muscle relaxants:no Current anti-anxiolytic:Librium Current sleep aide:Librium Current Antihypertensive medications:no Current Antidepressant medications:no Current Anticonvulsant medications:zonisamide 50mg  daily Current anti-CGRP:no Current Vitamins/Herbal/Supplements:D Current Antihistamines/Decongestants:no Other therapy:Botox (since 01/03/2019, last injection 10/24/2019) Hormone:estradiol  She reports worsening short-term memory problems.  She needs to keep sticky notes as reminders.  3 or 4 weeks ago, her thyroid level was "low" and more tests were ordered.  She does have increased emotional stress related to her chronic back pain, her sister, and possibly COVID.  Her sister has dementia  Caffeine:1 cup 1/2 decaf coffee daily Alcohol:no Smoker:no Diet:hydrates Exercise:Stopped due to torn knee meniscus Depression:no; Anxiety:yes. Other pain:back pain Sleep  hygiene:poor  HISTORY: Onset: 2016.She has remote history of migraines which were controlled for many years. In December 2015, her husband unexpectedly passed away, which triggered depression and anxiety. She subsequently recovered. However, in early June2016, she witnessed her boyfriend's ill father pass away. It triggered flashbacks of her husband's death, which increased depression, anxiety and migraines. She has significant history of side effects to multiple medications. Location: Left peri-orbital Quality: pounding Initial Intensity: 10/10 Aura: no Prodrome: no Associated symptoms: Nausea, photophobia, phonophobia, osmophobia Initial Duration: All day Initial Frequency: 15 days per month Triggers: Emotional stress Relieving factors: Tramadol Activity: aggravates  Past NSAIDS: Oral NSAIDs contraindicated due to bleeding ulcer Past analgesics: Cannot take ASA products such as Excedrin due to bleeding ulcer Past abortive triptans: Relpax (took it multiple times but caused chest tightness once, but was effective), sumatriptan (many years ago) Past anti-emetic: Zofran 8mg  (effective) Past anti-anxiolytic: benzodiazepines Past antihypertensive medications: propranolol (chest tightness) Past antidepressant medications: Multiple, such as Cymbalta. She has had intolerance to multiple antidepressants over the years. Past anticonvulsant medications: topiramate (chest tightness); Depakote ER250mg  daily (higher doses caused increased hunger) Past anti-CGRP: Aimovig(constipation, nausea) Past vitamins/Herbal/Supplements: no  Family history of headache: Mom, sister  MRI and MRA of head from 11/05/10 were personally reviewed and were unremarkable.  PAST MEDICAL HISTORY: Past Medical History:  Diagnosis Date  . Abnormal Pap smear of cervix 07/2016   CIN1  . 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 Constipation   . Depression   . Diverticulosis   . H.  pylori infection   . History of blood transfusion 1984  . Hx of colonic polyps   . Hypercholesteremia   . Hyponatremia   . Migraines   . Vitamin D deficiency     MEDICATIONS: Current Outpatient Medications on File Prior to Visit  Medication Sig Dispense Refill  . acetaminophen (TYLENOL) 325 MG tablet Take 325 mg by mouth every 6 (six) hours as needed.     . Calcium 600-200 MG-UNIT tablet  Take 1 tablet by mouth daily.    . chlordiazePOXIDE (LIBRIUM) 10 MG capsule Take 10 mg by mouth at bedtime.     . divalproex (DEPAKOTE ER) 250 MG 24 hr tablet TAKE 2 TABLETS AT BEDTIME. 60 tablet 0  . docusate sodium (COLACE) 100 MG capsule Take 100 mg by mouth at bedtime.     . lansoprazole (PREVACID) 30 MG capsule Take 30 mg by mouth 2 (two) times daily before a meal.     . lidocaine (XYLOCAINE) 5 % ointment Apply 1 application topically 3 (three) times daily. Use as needed for pain in the vulva. 1.25 g 1  . NONFORMULARY OR COMPOUNDED ITEM Gabapentin 6% topical cream in neutral base.  Apply to the vulva tid.   Dispense:  30 grams,  RF one. (Patient not taking: Reported on 05/22/2019) 60 each 1  . nystatin-triamcinolone ointment (MYCOLOG) Apply 1 application topically 2 (two) times daily. Use for one week as needed. 30 g 0  . OnabotulinumtoxinA (BOTOX IJ)     . ondansetron (ZOFRAN) 4 MG tablet Take 1 tablet (4 mg total) by mouth every 8 (eight) hours as needed for nausea or vomiting. 20 tablet 0  . polyethylene glycol (MIRALAX / GLYCOLAX) packet Take 17 g by mouth at bedtime.     . promethazine (PHENERGAN) 25 MG tablet Take 1 to 2 tablets every 6 hours as needed for nausea. 30 tablet 5  . Simethicone (PHAZYME MAXIMUM STRENGTH) 250 MG CAPS Take 250 mg by mouth daily as needed (gas).    . Ubrogepant (UBRELVY) 100 MG TABS Take 1 tablet by mouth as needed (May repeat 1 tablet in 2 hours.  Maximum 2 tablets in 24 hours). 10 tablet 3  . XIIDRA 5 % SOLN Place 1 drop into both eyes 2 (two) times daily.     [name]  zonisamide (ZONEGRAN) 50 MG capsule Take 1 capsule (50 mg total) by mouth daily. 30 capsule 5   No current facility-administered medications on file prior to visit.    ALLERGIES: Allergies  Allergen Reactions  . Aspirin Other (See Comments)    Hx of bleeding stomach ulcer   . Crestor [Rosuvastatin Calcium] Itching  . Inderal [Propranolol] Other (See Comments)    Chest tightness  . Macrobid [Nitrofurantoin Monohyd Macro] Diarrhea  . Other     Other reaction(s): Other (See Comments) Cholesterol meds-multiple reactions to multiple meds  . Relpax [Eletriptan Hydrobromide] Other (See Comments)    Chest Tightness   . Salicylates Other (See Comments)    Told not to take due to hx of GI ulcer  . Topamax [Topiramate] Other (See Comments)    Chest tightness    FAMILY HISTORY: Family History  Problem Relation Age of Onset  . Migraines Mother   . Migraines Sister   . Cancer Father        Prostate   SOCIAL HISTORY: Social History   Socioeconomic History  . Marital status: Widowed    Spouse name: Not on file  . Number of children: Not on file  . Years of education: Not on file  . Highest education level: Not on file  Occupational History  . Occupation: LPN  Tobacco Use  . Smoking status: Never Smoker  . Smokeless tobacco: Never Used  Substance and Sexual Activity  . Alcohol use: No  . Drug use: No  . Sexual activity: Not Currently    Birth control/protection: Post-menopausal, Surgical    Comment: Hyst  Other Topics Concern  . Not  on file  Social History Narrative   Pt has L.P.N through [employer] Determinants of Health   Financial Resource Strain:   . Difficulty of Paying Living Expenses:   Food Insecurity:   . Worried About Charity fundraiser in the Last Year:   . Arboriculturist in the Last Year:   Transportation Needs:   . Film/video editor (Medical):   [name] Lack of Transportation (Non-Medical):   Physical Activity:   . Days of Exercise per Week:   .  Minutes of Exercise per Session:   Stress:   . Feeling of Stress :   Social Connections:   . Frequency of Communication with Friends and Family:   . Frequency of Social Gatherings with Friends and Family:   . Attends Religious Services:   . Active Member of Clubs or Organizations:   . Attends Archivist Meetings:   [name] Marital Status:   Intimate Partner Violence:   . Fear of Current or Ex-Partner:   . Emotionally Abused:   [name] Physically Abused:   . Sexually Abused:      PHYSICAL EXAM: Blood pressure (!) 139/93, pulse 88, resp. rate 18, height 5\' 3"  (1.6 m), weight 140 lb (63.5 kg), last menstrual period 08/07/2001, SpO2 96 %. General: No acute distress.  Patient appears well-groomed.   Head:  Normocephalic/atraumatic Eyes:  Fundi examined but not visualized Neck: supple, no paraspinal tenderness, full range of motion Heart:  Regular rate and rhythm Lungs:  Clear to auscultation bilaterally Back: No paraspinal tenderness Neurological Exam: alert and oriented to person, place, and time. Attention span and concentration intact, recent and remote memory intact, fund of knowledge intact.  Speech fluent and not dysarthric, language intact.  CN II-XII intact. Bulk and tone normal, muscle strength 5/5 throughout.  Sensation to light touch, temperature and vibration intact.  Deep tendon reflexes 2+ throughout, toes downgoing.  Finger to nose and heel to shin testing intact.  Gait normal, Romberg negative.  IMPRESSION: 1.  Chronic migraine without aura, without status migrainosus, not intractable, aggravated with chronic tramadol treatment for back pain.  Improved since starting zonisamide. 2.  Memory deficits.  Scored 22 out of 30 on SLUMS which is out of proportion to her clinical presentation.  Suspect related to anxiety  PLAN: 1.  For preventative management:  Botox every 3 months; zonisamide 50mg  daily 2.  For abortive therapy, Ubrelvy 100mg  3.  Limit use of pain relievers to no  more than 2 days out of week to prevent risk of rebound or medication-overuse headache. 4.  Keep headache diary 5.  Exercise, hydration, caffeine cessation, sleep hygiene, monitor for and avoid triggers 6. Monitor memory.  We can pursue neuropsychological testing if needed. 7. Follow up for next Botox.   Metta Clines, DO  CC: Jonathon Jordan, MD

## 2019-12-01 NOTE — Patient Instructions (Signed)
1.  Continue Botox and zonisamide 2.  Use Ubrelvy as migraine rescue 3.  At this time, I think your memory problems are related to stress and emotional stress.  But we can always pursue memory deficits if needed. 4.  Follow up for Botox

## 2019-12-10 DIAGNOSIS — M47816 Spondylosis without myelopathy or radiculopathy, lumbar region: Secondary | ICD-10-CM | POA: Diagnosis not present

## 2019-12-10 DIAGNOSIS — Z79899 Other long term (current) drug therapy: Secondary | ICD-10-CM | POA: Diagnosis not present

## 2019-12-10 DIAGNOSIS — Z5181 Encounter for therapeutic drug level monitoring: Secondary | ICD-10-CM | POA: Diagnosis not present

## 2019-12-10 DIAGNOSIS — G43711 Chronic migraine without aura, intractable, with status migrainosus: Secondary | ICD-10-CM | POA: Diagnosis not present

## 2019-12-10 DIAGNOSIS — G894 Chronic pain syndrome: Secondary | ICD-10-CM | POA: Diagnosis not present

## 2019-12-16 DIAGNOSIS — M47816 Spondylosis without myelopathy or radiculopathy, lumbar region: Secondary | ICD-10-CM | POA: Diagnosis not present

## 2019-12-17 DIAGNOSIS — M545 Low back pain: Secondary | ICD-10-CM | POA: Diagnosis not present

## 2019-12-17 DIAGNOSIS — M5136 Other intervertebral disc degeneration, lumbar region: Secondary | ICD-10-CM | POA: Diagnosis not present

## 2020-01-09 ENCOUNTER — Ambulatory Visit
Admission: RE | Admit: 2020-01-09 | Discharge: 2020-01-09 | Disposition: A | Discharge status: Home / Self Care | Payer: Medicare PPO | Source: Ambulatory Visit | Attending: Obstetrics and Gynecology | Admitting: Obstetrics and Gynecology

## 2020-01-09 ENCOUNTER — Other Ambulatory Visit: Payer: Self-pay

## 2020-01-09 DIAGNOSIS — Z1231 Encounter for screening mammogram for malignant neoplasm of breast: Secondary | ICD-10-CM

## 2020-01-09 DIAGNOSIS — Z780 Asymptomatic menopausal state: Secondary | ICD-10-CM

## 2020-01-09 DIAGNOSIS — M810 Age-related osteoporosis without current pathological fracture: Secondary | ICD-10-CM | POA: Diagnosis not present

## 2020-01-09 DIAGNOSIS — M8580 Other specified disorders of bone density and structure, unspecified site: Secondary | ICD-10-CM

## 2020-01-09 DIAGNOSIS — M8588 Other specified disorders of bone density and structure, other site: Secondary | ICD-10-CM | POA: Diagnosis not present

## 2020-01-15 DIAGNOSIS — M47816 Spondylosis without myelopathy or radiculopathy, lumbar region: Secondary | ICD-10-CM | POA: Diagnosis not present

## 2020-01-23 ENCOUNTER — Ambulatory Visit (INDEPENDENT_AMBULATORY_CARE_PROVIDER_SITE_OTHER): Payer: Medicare PPO | Admitting: Neurology

## 2020-01-23 ENCOUNTER — Other Ambulatory Visit: Payer: Self-pay

## 2020-01-23 DIAGNOSIS — G43709 Chronic migraine without aura, not intractable, without status migrainosus: Secondary | ICD-10-CM

## 2020-01-23 MED ORDER — ONABOTULINUMTOXINA 100 UNITS IJ SOLR
155.0000 [IU] | Freq: Once | INTRAMUSCULAR | Status: AC
  Administered 2020-01-23: 155 [IU] via INTRAMUSCULAR

## 2020-01-23 NOTE — Progress Notes (Signed)
[facility]   Procedure Note Botox  Attending: Dr. Shon Millet  Preoperative Diagnosis(es): Chronic migraine  Consent obtained from: The patient Benefits discussed included, but were not limited to decreased muscle tightness, increased joint range of motion, and decreased pain.  Risk discussed included, but were not limited pain and discomfort, bleeding, bruising, excessive weakness, venous thrombosis, muscle atrophy and dysphagia.  Anticipated outcomes of the procedure as well as he risks and benefits of the alternatives to the procedure, and the roles and tasks of the personnel to be involved, were discussed with the patient, and the patient consents to the procedure and agrees to proceed. A copy of the patient medication guide was given to the patient which explains the blackbox warning.  Patients identity and treatment sites confirmed Yes.  .  Details of Procedure: Skin was cleaned with alcohol. Prior to injection, the needle plunger was aspirated to make sure the needle was not within a blood vessel.  There was no blood retrieved on aspiration.    Following is a summary of the muscles injected  And the amount of Botulinum toxin used:  Dilution 200 units of Botox was reconstituted with 4 ml of preservative free normal saline. Time of reconstitution: At the time of the office visit (<30 minutes prior to injection)   Injections  155 total units of Botox was injected with a 30 gauge needle.  Injection Sites: L occipitalis: 15 units- 3 sites  R occiptalis: 15 units- 3 sites  L upper trapezius: 15 units- 3 sites R upper trapezius: 15 units- 3 sits          L paraspinal: 10 units- 2 sites R paraspinal: 10 units- 2 sites  Face L frontalis(2 injection sites):10 units   R frontalis(2 injection sites):10 units         L corrugator: 5 units   R corrugator: 5 units           Procerus: 5 units   L temporalis: 20 units R temporalis: 20 units   Agent:  200 units of botulinum Type  A (Onobotulinum Toxin type A) was reconstituted with 4 ml of preservative free normal saline.  Time of reconstitution: At the time of the office visit (<30 minutes prior to injection)     Total injected (Units): 155  Total wasted (Units): no waste  Patient tolerated procedure well without complications.   Reinjection is anticipated in 3 months.

## 2020-02-05 NOTE — Progress Notes (Signed)
GYNECOLOGY  VISIT   HPI: 69 y.o.   Widowed  Caucasian  female   G2P1002 with Patient's last menstrual period was 08/07/2001 (approximate).   here to discuss BMD results.    BMD 01/09/20 showing osteoporosis of the left hip.   T score -2.8.   She is having left hip pain and she is doing injections.  She has severe arthritis, L4 and L5.  Seen by Dr. Venetia Maxon, and this is not a surgical issues.   Denies renal disease, dental implants, reflux or esophageal disease.  Lives alone.   Vaccinated against Covid.   GYNECOLOGIC HISTORY: Patient's last menstrual period was 08/07/2001 (approximate). Contraception:  Hyst Menopausal hormone therapy:  None Last mammogram: 01-12-20 3D/Neg/density B/BiRads1 Last pap smear: 05-20-18 Neg, 01/17/17 negative - done with Dr. Gates,07/17/16 CIN1/LSIL. HR HPV:Neg        OB History    Gravida  2   Para  2   Term  1   Preterm      AB      Living  2     SAB      TAB      Ectopic      Multiple      Live Births  2              Patient Active Problem List   Diagnosis Date Noted   Lumbar spondylosis 05/19/2019   Pain in right knee 02/24/2019   Low back pain 12/31/2018   Pain in joint of left shoulder 08/12/2018   Pain in joint of right shoulder 08/12/2018   Disorder of bladder 06/20/2018   Pain in left knee 06/05/2018   Stiffness of left knee 06/05/2018   Status post laparoscopy-assisted vaginal hysterectomy 04/24/2017   Dry eye syndrome 03/17/2016   Headache 03/17/2016   Hyperlipidemia 03/17/2016   Lack of bladder control 03/17/2016   Migraines 03/17/2016   Keratitis sicca, 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 pylori infection    History of blood transfusion 1984   Hx of colonic polyps     Hypercholesteremia    Hyponatremia    Migraines    Vitamin D deficiency     Past Surgical History:  Procedure Laterality Date   ABDOMINAL HYSTERECTOMY     ANTERIOR AND POSTERIOR REPAIR N/A 04/24/2017   Procedure: ANTERIOR (CYSTOCELE) AND POSTERIOR (RECTOCELE)  REPAIR;  Surgeon: Patton Salles, MD;  Location: [facility];  Service: Gynecology;  Laterality: N/A;   CESAREAN SECTION  1984   COLONOSCOPY  2012   COSMETIC SURGERY     Neck lift   CYSTOSCOPY N/A 04/24/2017   Procedure: CYSTOSCOPY;  Surgeon: Patton Salles, MD;  Location: [facility];  Service: Gynecology;  Laterality: N/A;   HEMORRHOID SURGERY  1984   LAPAROSCOPIC LYSIS OF ADHESIONS N/A 04/24/2017   Procedure: EXTENSIVE LAPAROSCOPIC LYSIS OF ADHESIONS;  Surgeon: Patton Salles, MD;  Location: [facility];  Service: Gynecology;  Laterality: N/A;   LAPAROSCOPIC VAGINAL HYSTERECTOMY WITH SALPINGO OOPHORECTOMY Bilateral 04/24/2017   Procedure: LAPAROSCOPIC ASSISTED VAGINAL HYSTERECTOMY WITH SALPINGO OOPHORECTOMY and pelvic washings;  Surgeon: Patton Salles, MD;  Location: [facility];  Service: Gynecology;  Laterality: Bilateral;  3 hours   RECTAL PROLAPSE REPAIR     Dr. Kendrick Ranch   UMBILICAL HERNIA REPAIR     had surgery as a child.    Current Outpatient Medications  Medication Sig Dispense Refill   Calcium 600-200 MG-UNIT tablet Take 1 tablet by mouth daily.     docusate sodium (COLACE) 100 MG capsule Take 100 mg by mouth at bedtime.      lidocaine (XYLOCAINE) 5 % ointment Apply 1 application topically 3 (three) times daily. Use as needed for pain in the vulva. 1.25 g 1   NONFORMULARY OR COMPOUNDED ITEM Gabapentin 6% topical cream in neutral base.  Apply to the vulva tid.   Dispense:  30 grams,  RF one. 60 each 1   nystatin-triamcinolone ointment (MYCOLOG) Apply 1 application topically 2 (two) times daily. Use for one week as needed. 30 g 0   OnabotulinumtoxinA (BOTOX IJ)       polyethylene glycol (MIRALAX / GLYCOLAX) packet Take 17 g by mouth at bedtime.      promethazine (PHENERGAN) 25 MG tablet Take 1 to 2 tablets every 6 hours as needed for nausea. 30 tablet 5   Simethicone (PHAZYME MAXIMUM STRENGTH) 250 MG CAPS Take 250 mg by mouth daily as needed (gas).     traMADol (ULTRAM) 50 MG tablet Take 1 tablet by mouth 4 (four) times daily as needed.     Ubrogepant (UBRELVY) 100 MG TABS Take 1 tablet by mouth as needed (May repeat 1 tablet in 2 hours.  Maximum 2 tablets in 24 hours). 10 tablet 3   XIIDRA 5 % SOLN Place 1 drop into both eyes 2 (two) times daily.      zonisamide (ZONEGRAN) 50 MG capsule Take 1 capsule (50 mg total) by mouth daily. 30 capsule 5   No current facility-administered medications for this visit.     ALLERGIES: Aspirin, Crestor [rosuvastatin calcium], Inderal [propranolol], Macrobid [nitrofurantoin monohyd macro], Other, Relpax [eletriptan hydrobromide], Salicylates, and Topamax [topiramate]  Family History  Problem Relation Age of Onset   Migraines Mother    Migraines Sister    Cancer Father        Prostate    Social History   Socioeconomic History   Marital status: Widowed    Spouse name: Not on file   Number of children: Not on file   Years of education: Not on file   Highest education level: Not on file  Occupational History   Occupation: LPN  Tobacco Use   Smoking status: Never Smoker   Smokeless tobacco: Never Used  Vaping Use   Vaping Use: Never used  Substance and Sexual Activity   Alcohol use: No   Drug use: No   Sexual activity: Not Currently    Birth control/protection: Post-menopausal, Surgical    Comment: Hyst  Other Topics Concern   Not on file  Social History Narrative   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   Right handed   [place]   Social Determinants of Health   Financial Resource Strain:    Difficulty of Paying Living Expenses:   Food Insecurity:    Worried About Patent examiner in the Last Year:    Barista in the Last Year:   Transportation Needs:    Freight forwarder (Medical):    Lack of Transportation (Non-Medical):   Physical Activity:    Days of Exercise per Week:    Minutes of Exercise per Session:   Stress:  Feeling of Stress :   Social Connections:    Frequency of Communication with Friends and Family:    Frequency of Social Gatherings with Friends and Family:    Attends Religious Services:    Active Member of Clubs or Organizations:    Attends Engineer, structural:    Marital Status:   Intimate Partner Violence:    Fear of Current or Ex-Partner:    Emotionally Abused:    Physically Abused:    Sexually Abused:     Review of Systems  All other systems reviewed and are negative.   PHYSICAL EXAMINATION:    BP 124/80    Pulse 80    Ht 5' (1.524 m)    Wt 141 lb 3.2 oz (64 kg)    LMP 08/07/2001 (Approximate)    BMI 27.58 kg/m     General appearance: alert, cooperative and appears stated age   ASSESSMENT  Osteoporosis.  PLAN  We discussed osteoporosis.  Will check BMP, PTH, vit D level.  We discussed reducing fall risk.  Treatment options reviewed - bisphosphonates, Evista, and Prolia.   Will start with Fosamax 70 mg weekly.  Instructed in use.   Will wait for blood work to come back before starting.  Fu in about 2.5 months.    An After Visit Summary was printed and given to the patient.  __30 minutes consultation time.

## 2020-02-11 ENCOUNTER — Other Ambulatory Visit: Payer: Self-pay

## 2020-02-11 ENCOUNTER — Encounter: Payer: Self-pay | Admitting: Obstetrics and Gynecology

## 2020-02-11 ENCOUNTER — Ambulatory Visit: Payer: Medicare PPO | Admitting: Obstetrics and Gynecology

## 2020-02-11 VITALS — BP 124/80 | HR 80 | Ht 60.0 in | Wt 141.2 lb

## 2020-02-11 DIAGNOSIS — M810 Age-related osteoporosis without current pathological fracture: Secondary | ICD-10-CM

## 2020-02-11 MED ORDER — ALENDRONATE SODIUM 70 MG PO TABS: 70.0000 mg | ORAL_TABLET | ORAL | 2 refills | Status: DC

## 2020-02-11 NOTE — Patient Instructions (Signed)
Alendronate tablets What is this medicine? ALENDRONATE (a LEN droe nate) slows calcium loss from bones. It helps to make normal healthy bone and to slow bone loss in people with Paget's disease and osteoporosis. It may be used in others at risk for bone loss. This medicine may be used for other purposes; ask your health care provider or pharmacist if you have questions. COMMON BRAND NAME(S): Fosamax What should I tell my health care provider before I take this medicine? They need to know if you have any of these conditions:  dental disease  esophagus, stomach, or intestine problems, like acid reflux or GERD  kidney disease  low blood calcium  low vitamin D  problems sitting or standing 30 minutes  trouble swallowing  an unusual or allergic reaction to alendronate, other medicines, foods, dyes, or preservatives  pregnant or trying to get pregnant  breast-feeding How should I use this medicine? You must take this medicine exactly as directed or you will lower the amount of the medicine you absorb into your body or you may cause yourself harm. Take this medicine by mouth first thing in the morning, after you are up for the day. Do not eat or drink anything before you take your medicine. Swallow the tablet with a full glass (6 to 8 fluid ounces) of plain water. Do not take this medicine with any other drink. Do not chew or crush the tablet. After taking this medicine, do not eat breakfast, drink, or take any medicines or vitamins for at least 30 minutes. Sit or stand up for at least 30 minutes after you take this medicine; do not lie down. Do not take your medicine more often than directed. Talk to your pediatrician regarding the use of this medicine in children. Special care may be needed. Overdosage: If you think you have taken too much of this medicine contact a poison control center or emergency room at once. NOTE: This medicine is only for you. Do not share this medicine with  others. What if I miss a dose? If you miss a dose, do not take it later in the day. Continue your normal schedule starting the next morning. Do not take double or extra doses. What may interact with this medicine?  aluminum hydroxide  antacids  aspirin  calcium supplements  drugs for inflammation like ibuprofen, naproxen, and others  iron supplements  magnesium supplements  vitamins with minerals This list may not describe all possible interactions. Give your health care provider a list of all the medicines, herbs, non-prescription drugs, or dietary supplements you use. Also tell them if you smoke, drink alcohol, or use illegal drugs. Some items may interact with your medicine. What should I watch for while using this medicine? Visit your doctor or health care professional for regular checks ups. It may be some time before you see benefit from this medicine. Do not stop taking your medicine except on your doctor's advice. Your doctor or health care professional may order blood tests and other tests to see how you are doing. You should make sure you get enough calcium and vitamin D while you are taking this medicine, unless your doctor tells you not to. Discuss the foods you eat and the vitamins you take with your health care professional. Some people who take this medicine have severe bone, joint, and/or muscle pain. This medicine may also increase your risk for a broken thigh bone. Tell your doctor right away if you have pain in your upper leg or  groin. Tell your doctor if you have any pain that does not go away or that gets worse. This medicine can make you more sensitive to the sun. If you get a rash while taking this medicine, sunlight may cause the rash to get worse. Keep out of the sun. If you cannot avoid being in the sun, wear protective clothing and use sunscreen. Do not use sun lamps or tanning beds/booths. What side effects may I notice from receiving this medicine? Side effects  that you should report to your doctor or health care professional as soon as possible:  allergic reactions like skin rash, itching or hives, swelling of the face, lips, or tongue  black or tarry stools  bone, muscle or joint pain  changes in vision  chest pain  heartburn or stomach pain  jaw pain, especially after dental work  pain or trouble when swallowing  redness, blistering, peeling or loosening of the skin, including inside the mouth Side effects that usually do not require medical attention (report to your doctor or health care professional if they continue or are bothersome):  changes in taste  diarrhea or constipation  eye pain or itching  headache  nausea or vomiting  stomach gas or fullness This list may not describe all possible side effects. Call your doctor for medical advice about side effects. You may report side effects to FDA at 1-800-FDA-1088. Where should I keep my medicine? Keep out of the reach of children. Store at room temperature of 15 and 30 degrees C (59 and 86 degrees F). Throw away any unused medicine after the expiration date. NOTE: This sheet is a summary. It may not cover all possible information. If you have questions about this medicine, talk to your doctor, pharmacist, or health care provider.  2020 Elsevier/Gold Standard (2011-01-20 08:56:09) Osteoporosis  Osteoporosis is thinning and loss of density in your bones. Osteoporosis makes bones more brittle and fragile and more likely to break (fracture). Over time, osteoporosis can cause your bones to become so weak that they fracture after a minor fall. Bones in the hip, wrist, and spine are most likely to fracture due to osteoporosis. What are the causes? The exact cause of this condition is not known. What increases the risk? You may be at greater risk for osteoporosis if you:  Have a family history of the condition.  Have poor nutrition.  Use steroid medicines, such as  prednisone.  Are female.  Are age 10 or older.  Smoke or have a history of smoking.  Are not physically active (are sedentary).  Are white (Caucasian) or of Asian descent.  Have a small body frame.  Take certain medicines, such as antiseizure medicines. What are the signs or symptoms? A fracture might be the first sign of osteoporosis, especially if the fracture results from a fall or injury that usually would not cause a bone to break. Other signs and symptoms include:  Pain in the neck or low back.  Stooped posture.  Loss of height. How is this diagnosed? This condition may be diagnosed based on:  Your medical history.  A physical exam.  A bone mineral density test, also called a DXA or DEXA test (dual-energy X-ray absorptiometry test). This test uses X-rays to measure the amount of minerals in your bones. How is this treated? The goal of treatment is to strengthen your bones and lower your risk for a fracture. Treatment may involve:  Making lifestyle changes, such as: ? Including foods with more calcium  and vitamin D in your diet. ? Doing weight-bearing and muscle-strengthening exercises. ? Stopping tobacco use. ? Limiting alcohol intake.  Taking medicine to slow the process of bone loss or to increase bone density.  Taking daily supplements of calcium and vitamin D.  Taking hormone replacement medicines, such as estrogen for women and testosterone for men.  Monitoring your levels of calcium and vitamin D. Follow these instructions at home:  Activity  Exercise as told by your health care provider. Ask your health care provider what exercises and activities are safe for you. You should do: ? Exercises that make you work against gravity (weight-bearing exercises), such as tai chi, yoga, or walking. ? Exercises to strengthen muscles, such as lifting weights. Lifestyle  Limit alcohol intake to no more than 1 drink a day for nonpregnant women and 2 drinks a day  for men. One drink equals 12 oz of beer, 5 oz of wine, or 1 oz of hard liquor.  Do not use any products that contain nicotine or tobacco, such as cigarettes and e-cigarettes. If you need help quitting, ask your health care provider. Preventing falls  Use devices to help you move around (mobility aids) as needed, such as canes, walkers, scooters, or crutches.  Keep rooms well-lit and clutter-free.  Remove tripping hazards from walkways, including cords and throw rugs.  Install grab bars in bathrooms and safety rails on stairs.  Use rubber mats in the bathroom and other areas that are often wet or slippery.  Wear closed-toe shoes that fit well and support your feet. Wear shoes that have rubber soles or low heels.  Review your medicines with your health care provider. Some medicines can cause dizziness or changes in blood pressure, which can increase your risk of falling. General instructions  Include calcium and vitamin D in your diet. Calcium is important for bone health, and vitamin D helps your body to absorb calcium. Good sources of calcium and vitamin D include: ? Certain fatty fish, such as salmon and tuna. ? Products that have calcium and vitamin D added to them (fortified products), such as fortified cereals. ? Egg yolks. ? Cheese. ? Liver.  Take over-the-counter and prescription medicines only as told by your health care provider.  Keep all follow-up visits as told by your health care provider. This is important. Contact a health care provider if:  You have never been screened for osteoporosis and you are: ? A woman who is age 20 or older. ? A man who is age 61 or older. Get help right away if:  You fall or injure yourself. Summary  Osteoporosis is thinning and loss of density in your bones. This makes bones more brittle and fragile and more likely to break (fracture),even with minor falls.  The goal of treatment is to strengthen your bones and reduce your risk for a  fracture.  Include calcium and vitamin D in your diet. Calcium is important for bone health, and vitamin D helps your body to absorb calcium.  Talk with your health care provider about screening for osteoporosis if you are a woman who is age 85 or older, or a man who is age 22 or older. This information is not intended to replace advice given to you by your health care provider. Make sure you discuss any questions you have with your health care provider. Document Revised: 07/06/2017 Document Reviewed: 05/18/2017 Elsevier Patient Education  2020 [employer].

## 2020-02-12 ENCOUNTER — Telehealth: Payer: Self-pay

## 2020-02-12 LAB — PTH, INTACT AND CALCIUM: PTH: 28 pg/mL (ref 15–65)

## 2020-02-12 LAB — BASIC METABOLIC PANEL
BUN/Creatinine Ratio: 35 — ABNORMAL HIGH (ref 12–28)
BUN: 24 mg/dL (ref 8–27)
CO2: 23 mmol/L (ref 20–29)
Calcium: 9 mg/dL (ref 8.7–10.3)
Chloride: 103 mmol/L (ref 96–106)
Creatinine, Ser: 0.68 mg/dL (ref 0.57–1.00)
GFR calc Af Amer: 104 mL/min/{1.73_m2} (ref >59)
GFR calc non Af Amer: 90 mL/min/{1.73_m2} (ref >59)
Glucose: 67 mg/dL (ref 65–99)
Potassium: 4.1 mmol/L (ref 3.5–5.2)
Sodium: 139 mmol/L (ref 134–144)

## 2020-02-12 LAB — VITAMIN D 25 HYDROXY (VIT D DEFICIENCY, FRACTURES): Vit D, 25-Hydroxy: 24.5 ng/mL — ABNORMAL LOW (ref 30.0–100.0)

## 2020-02-12 NOTE — Telephone Encounter (Signed)
-----   Message from Patton Salles, MD sent at 02/12/2020  1:07 PM EDT ----- Please contact patient with results of testing.  Her parathyroid hormone, calcium, and kidney function is normal.  Her vitamin D level is slightly low, and I recommend she start taking an additional vitamin D3 2000 IU daily to raise this level.  She can start her Fosamax in 2 weeks.

## 2020-02-12 NOTE — Telephone Encounter (Signed)
Spoke with pt. Pt given results and recommendations per Dr Edward Jolly. Pt agreeable and verbalized understanding. Pt will start taking additional Vit D3 and will start taking Fosamax on 02/26/20.   Routing to Dr Edward Jolly for review  Encounter closed.

## 2020-02-17 DIAGNOSIS — M25552 Pain in left hip: Secondary | ICD-10-CM | POA: Diagnosis not present

## 2020-03-02 ENCOUNTER — Telehealth: Payer: Self-pay | Admitting: Obstetrics and Gynecology

## 2020-03-02 DIAGNOSIS — M810 Age-related osteoporosis without current pathological fracture: Secondary | ICD-10-CM

## 2020-03-02 NOTE — Telephone Encounter (Signed)
Spoke with pt. Pt states started taking Fosamax on 02/27/20 and started having severe stomach cramping for 3 days over weekend. Pt states used heating pad.  Pt denies any cramps now. Pt denies having any vaginal bleeding or spotting, nausea, vomiting or diarrhea. Pt states not wanting to continue Fosamax and wanting to know what other treatment options are available for her with osteoporosis. Pt states is still taking Calcium and Vit D.  Advised will review with Dr Edward Jolly and return call to pt. Pt agreeable.  Routing to Dr Edward Jolly.

## 2020-03-02 NOTE — Telephone Encounter (Signed)
Patient says she's had bad cramping after taking fosamax.

## 2020-03-03 DIAGNOSIS — M47816 Spondylosis without myelopathy or radiculopathy, lumbar region: Secondary | ICD-10-CM | POA: Diagnosis not present

## 2020-03-03 DIAGNOSIS — G894 Chronic pain syndrome: Secondary | ICD-10-CM | POA: Diagnosis not present

## 2020-03-03 DIAGNOSIS — G43711 Chronic migraine without aura, intractable, with status migrainosus: Secondary | ICD-10-CM | POA: Diagnosis not present

## 2020-03-03 NOTE — Telephone Encounter (Signed)
I believe she had interest in taking Prolia.   I would recommend referral to endocrinology for consultation of osteoporosis and discussion of treatment options.  She may be a good candidate for treatment with Prolia or Reclast, which we do not currently do at this office.   Would she consider Dr. Elvera Lennox?

## 2020-03-03 NOTE — Telephone Encounter (Signed)
Spoke with pt. Pt given update from Dr Edward Jolly. Pt agreeable to see Dr Elvera Lennox. Pt is interested in taking Prolia or Reclast. Pt verbalized understanding that we do not do this at our office. Referral placed  Routing to Dr Edward Jolly for review.  Encounter closed Cc: Rosa for referral

## 2020-03-18 DIAGNOSIS — M5136 Other intervertebral disc degeneration, lumbar region: Secondary | ICD-10-CM | POA: Diagnosis not present

## 2020-03-18 DIAGNOSIS — Z79891 Long term (current) use of opiate analgesic: Secondary | ICD-10-CM | POA: Insufficient documentation

## 2020-03-30 DIAGNOSIS — M25561 Pain in right knee: Secondary | ICD-10-CM | POA: Diagnosis not present

## 2020-03-30 DIAGNOSIS — M25562 Pain in left knee: Secondary | ICD-10-CM | POA: Diagnosis not present

## 2020-04-06 DIAGNOSIS — M170 Bilateral primary osteoarthritis of knee: Secondary | ICD-10-CM | POA: Diagnosis not present

## 2020-04-13 DIAGNOSIS — M25561 Pain in right knee: Secondary | ICD-10-CM | POA: Diagnosis not present

## 2020-04-13 DIAGNOSIS — M170 Bilateral primary osteoarthritis of knee: Secondary | ICD-10-CM | POA: Diagnosis not present

## 2020-04-13 DIAGNOSIS — M25562 Pain in left knee: Secondary | ICD-10-CM | POA: Diagnosis not present

## 2020-04-14 NOTE — Progress Notes (Deleted)
GYNECOLOGY  VISIT   HPI: 69 y.o.   Widowed  Caucasian  female   [id number] with Patient's last menstrual period was 08/07/2001 (approximate).   here for 2 month medication follow up.   GYNECOLOGIC HISTORY: Patient's last menstrual period was 08/07/2001 (approximate). Contraception:  Hyst Menopausal hormone therapy:  None Last mammogram:  01-12-20 3D/Neg/density B/BiRads1 Last pap smear: 05-20-18 Neg, 01/17/17 negative - done with Dr. Gates,07/17/16 CIN1/LSIL. HR HPV:Neg        OB History    Gravida  2   Para  2   Term  1   Preterm      AB      Living  2     SAB      TAB      Ectopic      Multiple      Live Births  2              Patient Active Problem List   Diagnosis Date Noted  . Lumbar spondylosis 05/19/2019  . Pain in right knee 02/24/2019  . Low back pain 12/31/2018  . Pain in joint of left shoulder 08/12/2018  . Pain in joint of right shoulder 08/12/2018  . Disorder of bladder 06/20/2018  . Pain in left knee 06/05/2018  . Stiffness of left knee 06/05/2018  . Status post laparoscopy-assisted vaginal hysterectomy 04/24/2017  . Dry eye syndrome 03/17/2016  . Headache 03/17/2016  . Hyperlipidemia 03/17/2016  . Lack of bladder control 03/17/2016  . Migraines 03/17/2016  . 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 Abnormal Pap smear of cervix 07/2016   CIN1  . 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 Constipation   . Depression   . Diverticulosis   . H. pylori infection   . History of blood transfusion 1984  . Hx of colonic polyps   . Hypercholesteremia   . Hyponatremia   . Migraines   . Vitamin D deficiency     Past Surgical History:  Procedure Laterality Date  . ABDOMINAL HYSTERECTOMY    . ANTERIOR AND POSTERIOR REPAIR N/A 04/24/2017   Procedure: ANTERIOR (CYSTOCELE) AND POSTERIOR (RECTOCELE)  REPAIR;  Surgeon: Patton Salles, MD;   Location: [facility];  Service: Gynecology;  Laterality: N/A;  . CESAREAN SECTION  1984  . COLONOSCOPY  2012  . COSMETIC SURGERY     Neck lift  . CYSTOSCOPY N/A 04/24/2017   Procedure: CYSTOSCOPY;  Surgeon: Patton Salles, MD;  Location: [facility];  Service: Gynecology;  Laterality: N/A;  . HEMORRHOID SURGERY  1984  . LAPAROSCOPIC LYSIS OF ADHESIONS N/A 04/24/2017   Procedure: EXTENSIVE LAPAROSCOPIC LYSIS OF ADHESIONS;  Surgeon: Patton Salles, MD;  Location: [facility];  Service: Gynecology;  Laterality: N/A;  . LAPAROSCOPIC VAGINAL HYSTERECTOMY WITH SALPINGO OOPHORECTOMY Bilateral 04/24/2017   Procedure: LAPAROSCOPIC ASSISTED VAGINAL HYSTERECTOMY WITH SALPINGO OOPHORECTOMY and pelvic washings;  Surgeon: Patton Salles, MD;  Location: [facility];  Service: Gynecology;  Laterality: Bilateral;  3 hours  . RECTAL PROLAPSE REPAIR     Dr. Kendrick Ranch  . UMBILICAL HERNIA REPAIR     had surgery as a child.    Current Outpatient Medications  Medication Sig Dispense Refill  . alendronate (FOSAMAX) 70 MG tablet Take 1 tablet (70 mg total) by mouth every 7 (seven) days. Take first thing in am with  6 oz. Water.  Be upright after taking.  Eat nothing for one hour. 4 tablet 2  . Calcium 600-200 MG-UNIT tablet Take 1 tablet by mouth daily.    [name] docusate sodium (COLACE) 100 MG capsule Take 100 mg by mouth at bedtime.     . lidocaine (XYLOCAINE) 5 % ointment Apply 1 application topically 3 (three) times daily. Use as needed for pain in the vulva. 1.25 g 1  . NONFORMULARY OR COMPOUNDED ITEM Gabapentin 6% topical cream in neutral base.  Apply to the vulva tid.   Dispense:  30 grams,  RF one. 60 each 1  . nystatin-triamcinolone ointment (MYCOLOG) Apply 1 application topically 2 (two) times daily. Use for one week as needed. 30 g 0  . OnabotulinumtoxinA (BOTOX IJ)     . polyethylene glycol (MIRALAX / GLYCOLAX) packet Take 17 g by mouth at bedtime.     . promethazine (PHENERGAN) 25 MG tablet  Take 1 to 2 tablets every 6 hours as needed for nausea. 30 tablet 5  . Simethicone (PHAZYME MAXIMUM STRENGTH) 250 MG CAPS Take 250 mg by mouth daily as needed (gas).    . traMADol (ULTRAM) 50 MG tablet Take 1 tablet by mouth 4 (four) times daily as needed.    [name] Ubrogepant (UBRELVY) 100 MG TABS Take 1 tablet by mouth as needed (May repeat 1 tablet in 2 hours.  Maximum 2 tablets in 24 hours). 10 tablet 3  . XIIDRA 5 % SOLN Place 1 drop into both eyes 2 (two) times daily.     [name] zonisamide (ZONEGRAN) 50 MG capsule Take 1 capsule (50 mg total) by mouth daily. 30 capsule 5   No current facility-administered medications for this visit.     ALLERGIES: Aspirin, Crestor [rosuvastatin calcium], Inderal [propranolol], Macrobid [nitrofurantoin monohyd macro], Other, Relpax [eletriptan hydrobromide], Salicylates, and Topamax [topiramate]  Family History  Problem Relation Age of Onset  . Migraines Mother   . Migraines Sister   . Cancer Father        Prostate    Social History   Socioeconomic History  . Marital status: Widowed    Spouse name: Not on file  . Number of children: Not on file  . Years of education: Not on file  . Highest education level: Not on file  Occupational History  . Occupation: LPN  Tobacco Use  . Smoking status: Never Smoker  . Smokeless tobacco: Never Used  Vaping Use  . Vaping Use: Never used  Substance and Sexual Activity  . Alcohol use: No  . Drug use: No  . Sexual activity: Not Currently    Birth control/protection: Post-menopausal, Surgical    Comment: Hyst  Other Topics Concern  . 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 Difficulty of Paying Living Expenses: Not on file  Food Insecurity:   . Worried About Programme researcher, broadcasting/film/video in the Last Year: Not on file  . Ran Out of Food in the Last Year: Not on file  Transportation Needs:    . Lack of Transportation (Medical): Not on file  . Lack of Transportation (Non-Medical): Not on file  Physical Activity:   . Days of Exercise per Week: Not on file  . Minutes of Exercise per Session: Not on file  Stress:   . Feeling of Stress : Not on  file  Social Connections:   . Frequency of Communication with Friends and Family: Not on file  . Frequency of Social Gatherings with Friends and Family: Not on file  . Attends Religious Services: Not on file  . Active Member of Clubs or Organizations: Not on file  . Attends Banker Meetings: Not on file  . Marital Status: Not on file  Intimate Partner Violence:   . Fear of Current or Ex-Partner: Not on file  . Emotionally Abused: Not on file  . Physically Abused: Not on file  . Sexually Abused: Not on file    Review of Systems  PHYSICAL EXAMINATION:    LMP 08/07/2001 (Approximate)     General appearance: alert, cooperative and appears stated age Head: Normocephalic, without obvious abnormality, atraumatic Neck: no adenopathy, supple, symmetrical, trachea midline and thyroid normal to inspection and palpation Lungs: clear to auscultation bilaterally Breasts: normal appearance, no masses or tenderness, No nipple retraction or dimpling, No nipple discharge or bleeding, No axillary or supraclavicular adenopathy Heart: regular rate and rhythm Abdomen: soft, non-tender, no masses,  no organomegaly Extremities: extremities normal, atraumatic, no cyanosis or edema Skin: Skin color, texture, turgor normal. No rashes or lesions Lymph nodes: Cervical, supraclavicular, and axillary nodes normal. No abnormal inguinal nodes palpated Neurologic: Grossly normal  Pelvic: External genitalia:  no lesions              Urethra:  normal appearing urethra with no masses, tenderness or lesions              Bartholins and Skenes: normal                 Vagina: normal appearing vagina with normal color and discharge, no lesions               Cervix: no lesions                Bimanual Exam:  Uterus:  normal size, contour, position, consistency, mobility, non-tender              Adnexa: no mass, fullness, tenderness              Rectal exam: {yes no:[id number]}.  Confirms.              Anus:  normal sphincter tone, no lesions  Chaperone was present for exam.  ASSESSMENT     PLAN     An After Visit Summary was printed and given to the patient.  ______ minutes face to face time of which over 50% was spent in counseling.

## 2020-04-19 ENCOUNTER — Telehealth: Payer: Self-pay

## 2020-04-19 NOTE — Telephone Encounter (Signed)
Patient called to cancel follow up appointment for (04/21/20). Patient stated she would not like to reschedule at this time but wants to discuss at AEX.

## 2020-04-19 NOTE — Telephone Encounter (Signed)
Spoke with patient. Patient was scheduled for med f/u. Stopped Fosamax in 02/2020 (see 03/02/20 telephone encounter). Medication list updated.   Patient is scheduled to see endocrinology on 05/24/20 to discuss tx options for osteoporosis, will see Dr. Edward Jolly on 05/25/20 for AEX. Advised I will update Dr. Edward Jolly, will plan to see her for her AEX. Patient agreeable to plan.   Routing to provider for final review. Patient is agreeable to disposition. Will close encounter.

## 2020-04-21 ENCOUNTER — Ambulatory Visit: Payer: Medicare PPO | Admitting: Obstetrics and Gynecology

## 2020-04-23 ENCOUNTER — Ambulatory Visit: Payer: Medicare PPO | Admitting: Neurology

## 2020-04-23 ENCOUNTER — Ambulatory Visit (INDEPENDENT_AMBULATORY_CARE_PROVIDER_SITE_OTHER): Payer: Medicare PPO | Admitting: Neurology

## 2020-04-23 ENCOUNTER — Other Ambulatory Visit: Payer: Self-pay

## 2020-04-23 DIAGNOSIS — G43709 Chronic migraine without aura, not intractable, without status migrainosus: Secondary | ICD-10-CM

## 2020-04-23 MED ORDER — ONABOTULINUMTOXINA 100 UNITS IJ SOLR
170.0000 [IU] | Freq: Once | INTRAMUSCULAR | Status: AC
  Administered 2020-04-23: 155 [IU] via INTRAMUSCULAR

## 2020-04-23 NOTE — Progress Notes (Signed)
[facility]  ° °Procedure Note Botox ° °Attending: Dr. Kimball Appleby ° °Preoperative Diagnosis(es): Chronic migraine ° °Consent obtained from: The patient °Benefits discussed included, but were not limited to decreased muscle tightness, increased joint range of motion, and decreased pain.  Risk discussed included, but were not limited pain and discomfort, bleeding, bruising, excessive weakness, venous thrombosis, muscle atrophy and dysphagia.  Anticipated outcomes of the procedure as well as he risks and benefits of the alternatives to the procedure, and the roles and tasks of the personnel to be involved, were discussed with the patient, and the patient consents to the procedure and agrees to proceed. A copy of the patient medication guide was given to the patient which explains the blackbox warning. ° °Patients identity and treatment sites confirmed Yes.  . ° °Details of Procedure: °Skin was cleaned with alcohol. Prior to injection, the needle plunger was aspirated to make sure the needle was not within a blood vessel.  There was no blood retrieved on aspiration.   ° °Following is a summary of the muscles injected  And the amount of Botulinum toxin used: ° °Dilution °200 units of Botox was reconstituted with 4 ml of preservative free normal saline. °Time of reconstitution: At the time of the office visit (<30 minutes prior to injection)  ° °Injections  °155 total units of Botox was injected with a 30 gauge needle. ° °Injection Sites: °L occipitalis: 15 units- 3 sites  °R occiptalis: 15 units- 3 sites ° °L upper trapezius: 15 units- 3 sites °R upper trapezius: 15 units- 3 sits          °L paraspinal: 10 units- 2 sites °R paraspinal: 10 units- 2 sites ° °Face °L frontalis(2 injection sites):10 units   °R frontalis(2 injection sites):10 units         °L corrugator: 5 units   °R corrugator: 5 units           °Procerus: 5 units   °L temporalis: 20 units °R temporalis: 20 units  ° °Agent:  °200 units of botulinum Type  A (Onobotulinum Toxin type A) was reconstituted with 4 ml of preservative free normal saline.  °Time of reconstitution: At the time of the office visit (<30 minutes prior to injection)  ° ° ° Total injected (Units): 155 ° Total wasted (Units): 20 ° °Patient tolerated procedure well without complications.   °Reinjection is anticipated in 3 months. ° ° ° °

## 2020-04-23 NOTE — Addendum Note (Signed)
Addended by: Leida Lauth on: 04/23/2020 04:03 PM   Modules accepted: Orders

## 2020-05-19 DIAGNOSIS — H5213 Myopia, bilateral: Secondary | ICD-10-CM | POA: Diagnosis not present

## 2020-05-19 DIAGNOSIS — H524 Presbyopia: Secondary | ICD-10-CM | POA: Diagnosis not present

## 2020-05-19 DIAGNOSIS — H04123 Dry eye syndrome of bilateral lacrimal glands: Secondary | ICD-10-CM | POA: Diagnosis not present

## 2020-05-20 DIAGNOSIS — M170 Bilateral primary osteoarthritis of knee: Secondary | ICD-10-CM | POA: Diagnosis not present

## 2020-05-20 DIAGNOSIS — M25551 Pain in right hip: Secondary | ICD-10-CM | POA: Diagnosis not present

## 2020-05-20 DIAGNOSIS — M25552 Pain in left hip: Secondary | ICD-10-CM | POA: Diagnosis not present

## 2020-05-21 ENCOUNTER — Encounter: Payer: Self-pay | Admitting: Internal Medicine

## 2020-05-24 ENCOUNTER — Ambulatory Visit: Payer: Medicare PPO | Admitting: Internal Medicine

## 2020-05-24 ENCOUNTER — Encounter: Payer: Self-pay | Admitting: Internal Medicine

## 2020-05-24 ENCOUNTER — Other Ambulatory Visit: Payer: Self-pay

## 2020-05-24 VITALS — BP 140/84 | HR 94 | Ht 60.0 in | Wt 143.4 lb

## 2020-05-24 DIAGNOSIS — E1142 Type 2 diabetes mellitus with diabetic polyneuropathy: Secondary | ICD-10-CM

## 2020-05-24 DIAGNOSIS — Z23 Encounter for immunization: Secondary | ICD-10-CM

## 2020-05-24 DIAGNOSIS — E1165 Type 2 diabetes mellitus with hyperglycemia: Secondary | ICD-10-CM

## 2020-05-24 DIAGNOSIS — M810 Age-related osteoporosis without current pathological fracture: Secondary | ICD-10-CM | POA: Diagnosis not present

## 2020-05-24 DIAGNOSIS — E559 Vitamin D deficiency, unspecified: Secondary | ICD-10-CM | POA: Diagnosis not present

## 2020-05-24 NOTE — Progress Notes (Signed)
Patient ID: [name], female   DOB: [birth year], 69 y.o.   MRN: [id number]   This visit occurred during the SARS-CoV-2 public health emergency.  Safety protocols were in place, including screening questions prior to the visit, additional usage of staff PPE, and extensive cleaning of exam room while observing appropriate contact time as indicated for disinfecting solutions.   HPI  [name] is a 69 y.o.-year-old female, referred by her OB/GYN doctor, Dr. Edward Jolly, for management of osteoporosis (OP).  Pt was dx with OP in 01/2020.  I reviewed pt's DXA scans: Date L1-L4 T score FN T score 33% distal Radius  01/09/2020 ([facility]) -1.5 (-1.2%) RFN: -2.7 LFN: -2.8 (-6.9%*) n/a  08/29/2017 ([facility]) -1.4 RFN: -2.3 LFN: -2.4 (-14.1%*) n/a   Of note, she has left hip osteoarthritis and got steroid injections. However, last 2 injections were with gel (the last was completely ineffective). She also has steroid inj's in knees.   She denies fractures.  No falls.    No dizziness/vertigo/orthostasis/poor vision. Her house is on one level.  She lives alone.  Previous OP treatments:  - Dr. Edward Jolly recommended Fosamax 70 mg weekly at patient's visit from 02/2020 >> stomach pain.  + h/o vitamin D insufficiency. Reviewed available vit D levels: Lab Results  Component Value Date   VD25OH 24.5 (L) 02/11/2020   Pt is on: - calcium 1200 mg + 1000 units daily - vitamin D 2000 units - started 02/2020.  No weight bearing exercises.  She walks for exercise - 1 mi a day - limited because of back and knee/hip pain.  She does not take high vitamin A doses.  Menopause was at 69 y/o.   ? FH of osteoporosis. Mother died at 77 y/o from a heart attack.  No h/o hyper/hypocalcemia or hyperparathyroidism. No h/o kidney stones. Lab Results  Component Value Date   PTH 28 02/11/2020   PTH Comment 02/11/2020   CALCIUM 9.0 02/11/2020   CALCIUM 9.5 09/17/2018   CALCIUM 9.2 01/11/2018    CALCIUM 9.1 12/06/2017   CALCIUM 9.2 05/21/2017   CALCIUM 9.5 05/10/2017   CALCIUM 7.7 (L) 04/25/2017   CALCIUM 9.3 04/16/2017   CALCIUM 8.9 02/22/2017   CALCIUM 9.7 01/23/2015   No h/o thyrotoxicosis. Reviewed TSH recent levels:  Lab Results  Component Value Date   TSH 3.48 09/17/2018   TSH 8.371 (H) 02/22/2017   TSH 3.36 06/02/2016   TSH 3.258 11/05/2010   No h/o CKD. Last BUN/Cr: Lab Results  Component Value Date   BUN 24 02/11/2020   CREATININE 0.68 02/11/2020   ROS: Constitutional: no weight gain, no weight loss, no fatigue, no subjective hyperthermia, no subjective hypothermia, no nocturia, + poor sleep Eyes: no blurry vision, no xerophthalmia ENT: no sore throat, no nodules palpated in neck, no dysphagia, no odynophagia, no hoarseness, no tinnitus, no hypoacusis Cardiovascular: no CP, no SOB, no palpitations, no leg swelling Respiratory: no cough, no SOB, no wheezing Gastrointestinal: no N, no V, no D, no C, no acid reflux Musculoskeletal: no muscle, + joint aches Skin: no rash, no hair loss Neurological: no tremors, no numbness or tingling/no dizziness/+ HAs Psychiatric: no depression, no anxiety  I reviewed pt's medications, allergies, PMH, social hx, family hx, and changes were documented in the history of present illness. Otherwise, unchanged from my initial visit note.  Past Medical History:  Diagnosis Date  . Abnormal Pap smear of cervix 07/2016   CIN1  . Arthritis 2020   back,  bilateral knees  . Constipation   . Depression   . Diverticulosis   . H. pylori infection   . History of blood transfusion 1984  . Hx of colonic polyps   . Hypercholesteremia   . Hyponatremia   . Migraines   . Vitamin D deficiency    Past Surgical History:  Procedure Laterality Date  . ABDOMINAL HYSTERECTOMY    . ANTERIOR AND POSTERIOR REPAIR N/A 04/24/2017   Procedure: ANTERIOR (CYSTOCELE) AND POSTERIOR (RECTOCELE)  REPAIR;  Surgeon: Patton Salles, MD;   Location: [facility];  Service: Gynecology;  Laterality: N/A;  . CESAREAN SECTION  1984  . COLONOSCOPY  2012  . COSMETIC SURGERY     Neck lift  . CYSTOSCOPY N/A 04/24/2017   Procedure: CYSTOSCOPY;  Surgeon: Patton Salles, MD;  Location: [facility];  Service: Gynecology;  Laterality: N/A;  . HEMORRHOID SURGERY  1984  . LAPAROSCOPIC LYSIS OF ADHESIONS N/A 04/24/2017   Procedure: EXTENSIVE LAPAROSCOPIC LYSIS OF ADHESIONS;  Surgeon: Patton Salles, MD;  Location: [facility];  Service: Gynecology;  Laterality: N/A;  . LAPAROSCOPIC VAGINAL HYSTERECTOMY WITH SALPINGO OOPHORECTOMY Bilateral 04/24/2017   Procedure: LAPAROSCOPIC ASSISTED VAGINAL HYSTERECTOMY WITH SALPINGO OOPHORECTOMY and pelvic washings;  Surgeon: Patton Salles, MD;  Location: [facility];  Service: Gynecology;  Laterality: Bilateral;  3 hours  . RECTAL PROLAPSE REPAIR     Dr. Kendrick Ranch  . UMBILICAL HERNIA REPAIR     had surgery as a child.   Social History   Socioeconomic History  . Marital status: Widowed    Spouse name: Not on file  . Number of children: 2  . Years of education: Not on file  . Highest education level: Not on file  Occupational History  . Occupation: LPN  Tobacco Use  . Smoking status: Never Smoker  . Smokeless tobacco: Never Used  Vaping Use  . Vaping Use: Never used  Substance and Sexual Activity  . Alcohol use: No  . Drug use: No  . Sexual activity: Not Currently    Birth control/protection: Post-menopausal, Surgical    Comment: Hyst  Other Topics Concern  . 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 Difficulty of Paying Living Expenses: Not on file  Food Insecurity:   . Worried About Programme researcher, broadcasting/film/video in the Last Year: Not on file  . Ran Out of Food in the Last Year: Not on file  Transportation Needs:   . Lack of Transportation (Medical): Not on  file  . Lack of Transportation (Non-Medical): Not on file  Physical Activity:   . Days of Exercise per Week: Not on file  . Minutes of Exercise per Session: Not on file  Stress:   . Feeling of Stress : Not on file  Social Connections:   . Frequency of Communication with Friends and Family: Not on file  . Frequency of Social Gatherings with Friends and Family: Not on file  . Attends Religious Services: Not on file  . Active Member of Clubs or Organizations: Not on file  . Attends Banker Meetings: Not on file  . Marital Status: Not on file  Intimate Partner Violence:   . Fear of Current or Ex-Partner: Not on file  . Emotionally Abused: Not on file  .  Physically Abused: Not on file  . Sexually Abused: Not on file   Current Outpatient Medications on File Prior to Visit  Medication Sig Dispense Refill  . Calcium 600-200 MG-UNIT tablet Take 1 tablet by mouth daily.    [name] docusate sodium (COLACE) 100 MG capsule Take 100 mg by mouth at bedtime.     . lidocaine (XYLOCAINE) 5 % ointment Apply 1 application topically 3 (three) times daily. Use as needed for pain in the vulva. 1.25 g 1  . NONFORMULARY OR COMPOUNDED ITEM Gabapentin 6% topical cream in neutral base.  Apply to the vulva tid.   Dispense:  30 grams,  RF one. 60 each 1  . nystatin-triamcinolone ointment (MYCOLOG) Apply 1 application topically 2 (two) times daily. Use for one week as needed. 30 g 0  . OnabotulinumtoxinA (BOTOX IJ)     . polyethylene glycol (MIRALAX / GLYCOLAX) packet Take 17 g by mouth at bedtime.     . promethazine (PHENERGAN) 25 MG tablet Take 1 to 2 tablets every 6 hours as needed for nausea. 30 tablet 5  . Simethicone (PHAZYME MAXIMUM STRENGTH) 250 MG CAPS Take 250 mg by mouth daily as needed (gas).    . traMADol (ULTRAM) 50 MG tablet Take 1 tablet by mouth 4 (four) times daily as needed.    [name] Ubrogepant (UBRELVY) 100 MG TABS Take 1 tablet by mouth as needed (May repeat 1 tablet in 2 hours.  Maximum 2  tablets in 24 hours). 10 tablet 3  . XIIDRA 5 % SOLN Place 1 drop into both eyes 2 (two) times daily.     [name] zonisamide (ZONEGRAN) 50 MG capsule Take 1 capsule (50 mg total) by mouth daily. 30 capsule 5   No current facility-administered medications on file prior to visit.   Allergies  Allergen Reactions  . Aspirin Other (See Comments)    Hx of bleeding stomach ulcer   . Crestor [Rosuvastatin Calcium] Itching  . Inderal [Propranolol] Other (See Comments)    Chest tightness  . Macrobid [Nitrofurantoin Monohyd Macro] Diarrhea  . Other     Other reaction(s): Other (See Comments) Cholesterol meds-multiple reactions to multiple meds  . Relpax [Eletriptan Hydrobromide] Other (See Comments)    Chest Tightness   . Salicylates Other (See Comments)    Told not to take due to hx of GI ulcer  . Topamax [Topiramate] Other (See Comments)    Chest tightness   Family History  Problem Relation Age of Onset  . Migraines Mother   . Heart attack Mother   . Migraines Sister   . Cancer Father        Prostate   PE: BP 140/84   Pulse 94   Ht 5' (1.524 m)   Wt 143 lb 6.4 oz (65 kg)   LMP 08/07/2001 (Approximate)   SpO2 98%   BMI 28.01 kg/m  Wt Readings from Last 3 Encounters:  05/24/20 143 lb 6.4 oz (65 kg)  02/11/20 141 lb 3.2 oz (64 kg)  12/01/19 140 lb (63.5 kg)   Constitutional: Normal weight, in NAD. No kyphosis. Eyes: PERRLA, EOMI, no exophthalmos ENT: moist mucous membranes, no thyromegaly, no cervical lymphadenopathy Cardiovascular: Normal rate at the end of the visit, RR, No MRG Respiratory: CTA B Gastrointestinal: abdomen soft, NT, ND, BS+ Musculoskeletal: no deformities, strength intact in all 4 Skin: moist, warm, no rashes Neurological: no tremor with outstretched hands, DTR normal in all 4  Assessment: 1. Osteoporosis  Plan: 1. Osteoporosis - likely postmenopausal/age-related,  she has FH of OP, she also has a history of steroid injections in joints.  She is aware  about the effects of steroid injections on her bones, but she has intense pain in her hips and knees and trying to switch to gel injections, but they are not helping too much. - Discussed about increased risk of fracture, depending on the T score, greatly increased when the T score is lower than -2.5, but it is actually a continuum and -2.5 should not be regarded as an absolute threshold. We reviewed her latest 2 DXA scan reports together, and I explained that based on the T scores, she has an increased risk for fractures.  On the last DXA scan, her T-scores were in the osteoporotic range - we reviewed her dietary and supplemental calcium and vitamin D intake. I recommended to make sure she gets 1000-1200 mg of calcium daily preferentially from diet.  She is actually taking 1200 mg of calcium supplement a day.  We discussed about trying to get this from the diet and if she does not feel that she gets enough from the diet, only then to supplement.  She had low vitamin D at last check in 02/2020 and she was started on supplementation with 2000 +1000 units daily. I will check vit D today to see if she needs further supplementation. - discussed fall precautions   - [facility] Re: weight bearing exercises - advised to do this every day or at least 5/7 days - she is not smoking or drinking>2 drinks of alcohol a day. - I recommended the following book and explained  the concept of low acid eating:  - We discussed about the different medication classes, benefits and side effects (including atypical fractures and ONJ -she is now undergoing dental work -replacing crowns).  - She tried Fosamax but this caused stomach pain.  She would not want to try this again. The first options are sq denosumab (Prolia) sq every 6 months for 6-10 years and zoledronic acid (Reclast) iv 1x a year for 1-2 years. I would use Teriparatide/Abaloparatide (which are daily subcu medication) or  Romosozumab (monthly) as a last resort -she agrees. - Pt was given reading information about Prolia, and I explained the mechanism of action and expected benefits.  - Will check the following labs today: Orders Placed This Encounter  Procedures  . Basic metabolic panel  . VITAMIN D 25 Hydroxy (Vit-D Deficiency, Fractures)  - if labs normal, will arrange for a Prolia inj - will check a new DXA scan in 2 years after starting Prolia -  I explained that the first indication that the treatment is working is her not having anymore fractures. DXA scan changes are secondary: unchanged or slightly higher T-scores are desirable - will see pt back in a year  Pt did not stop at the lab...  Carlus Pavlovristina Jafari Mckillop, MD PhD [facility]

## 2020-05-24 NOTE — Patient Instructions (Addendum)
Please stop at the lab.  Please make sure you get 1200 mg calcium a day, referentially from the diet.  Continue 2000 vitamin D daily.  Please come back for a follow-up appointment in 1 year.  How Can I Prevent Falls? Men and women with osteoporosis need to take care not to fall down. Falls can break bones. Some reasons people fall are: Poor vision  Poor balance  Certain diseases that affect how you walk  Some types of medicine, such as sleeping pills.  Some tips to help prevent falls outdoors are: Use a cane or walker  Wear rubber-soled shoes so you don't slip  Walk on grass when sidewalks are slippery  In winter, put salt or kitty litter on icy sidewalks.  Some ways to help prevent falls indoors are: Keep rooms free of clutter, especially on floors  Use plastic or carpet runners on slippery floors  Wear low-heeled shoes that provide good support  Do not walk in socks, stockings, or slippers  Be sure carpets and area rugs have skid-proof backs or are tacked to the floor  Be sure stairs are well lit and have rails on both sides  Put grab bars on bathroom walls near tub, shower, and toilet  Use a rubber bath mat in the shower or tub  Keep a flashlight next to your bed  Use a sturdy step stool with a handrail and wide steps  Add more lights in rooms (and night lights) Buy a cordless phone to keep with you so that you don't have to rush to the phone       when it rings and so that you can call for help if you fall.   (adapted from http://www.niams.NightlifePreviews.se)  Please check out the following book about best diet for bone health:   Exercise for Strong Bones (from [place]) There are two types of exercises that are important for building and maintaining bone density:  weight-bearing and muscle-strengthening exercises. Weight-bearing Exercises These exercises include activities that make you move against gravity  while staying upright. Weight-bearing exercises can be high-impact or low-impact. High-impact weight-bearing exercises help build bones and keep them strong. If you have broken a bone due to osteoporosis or are at risk of breaking a bone, you may need to avoid high-impact exercises. If youre not sure, you should check with your healthcare provider. Examples of high-impact weight-bearing exercises are:  Dancing  Doing high-impact aerobics  Hiking  Jogging/running  Jumping Rope  Stair climbing  Tennis Low-impact weight-bearing exercises can also help keep bones strong and are a safe alternative if you cannot do high-impact exercises. Examples of low-impact weight-bearing exercises are:  Using elliptical training machines  Doing low-impact aerobics  Using stair-step machines  Fast walking on a treadmill or outside Muscle-Strengthening Exercises These exercises include activities where you move your body, a weight or some other resistance against gravity. They are also known as resistance exercises and include:  Lifting weights  Using elastic exercise bands  Using weight machines  Lifting your own body weight  Functional movements, such as standing and rising up on your toes Yoga and Pilates can also improve strength, balance and flexibility. However, certain positions may not be safe for people with osteoporosis or those at increased risk of broken bones. For example, exercises that have you bend forward may increase the chance of breaking a bone in the spine. A physical therapist should be able to help you learn which exercises are safe and appropriate for  you. Non-Impact Exercises Non-impact exercises can help you to improve balance, posture and how well you move in everyday activities. These exercises can also help to increase muscle strength and decrease the risk of falls and broken bones. Some of these exercises include:  Balance exercises that strengthen your legs and  test your balance, such as Tai Chi, can decrease your risk of falls.  Posture exercises that improve your posture and reduce rounded or sloping shoulders can help you decrease the chance of breaking a bone, especially in the spine.  Functional exercises that improve how well you move can help you with everyday activities and decrease your chance of falling and breaking a bone. For example, if you have trouble getting up from a chair or climbing stairs, you should do these activities as exercises. A physical therapist can teach you balance, posture and functional exercises. Starting a New Exercise Program If you havent exercised regularly for a while, check with your healthcare provider before beginning a new exercise program--particularly if you have health problems such as heart disease, diabetes or high blood pressure. If youre at high risk of breaking a bone, you should work with a physical therapist to develop a safe exercise program. Once you have your healthcare providers approval, start slowly. If youve already broken bones in the spine because of osteoporosis, be very careful to avoid activities that require reaching down, bending forward, rapid twisting motions, heavy lifting and those that increase your chance of a fall. As you get started, your muscles may feel sore for a day or two after you exercise. If soreness lasts longer, you may be working too hard and need to ease up. Exercises should be done in a pain-free range of motion. How Much Exercise Do You Need? Weight-bearing exercises 30 minutes on most days of the week. Do a 30-minutesession or multiple sessions spread out throughout the day. The benefits to your bones are the same.   Muscle-strengthening exercises Two to three days per week. If you dont have much time for strengthening/resistance training, do small amounts at a time. You can do just one body part each day. For example do arms one day, legs the next and trunk the  next. You can also spread these exercises out during your normal day.  Balance, posture and functional exercises Every day or as often as needed. You may want to focus on one area more than the others. If you have fallen or lose your balance, spend time doing balance exercises. If you are getting rounded shoulders, work more on posture exercises. If you have trouble climbing stairs or getting up from the couch, do more functional exercises. You can also perform these exercises at one time or spread them during your day. Work with a phyiscal therapist to learn the right exercises for you.    Denosumab: Patient drug information (Up-to-date) Copyright (248)[phone] [place] rights reserved.  Brand Names: [place]  ProliaDelton See What is this drug used for?  It is used to treat soft, brittle bones (osteoporosis).  It is used for bone growth.  It is used when treating some cancers.  It may be given to you for other reasons. Talk with the doctor. What do I need to tell my doctor BEFORE I take this drug?  All products:  If you have an allergy to denosumab or any other part of this drug.  If you are allergic to any drugs like this one, any other drugs, foods, or other substances.  Tell your doctor about the allergy and what signs you had, like rash; hives; itching; shortness of breath; wheezing; cough; swelling of face, lips, tongue, or throat; or any other signs.  If you have low calcium levels.  Prolia:  If you are pregnant or may be pregnant. Do not take this drug if you are pregnant.  This is not a list of all drugs or health problems that interact with this drug.  Tell your doctor and pharmacist about all of your drugs (prescription or OTC, natural products, vitamins) and health problems. You must check to make sure that it is safe for you to take this drug with all of your drugs and health problems. Do not start, stop, or change the dose of any drug without checking with your  doctor. What are some things I need to know or do while I take this drug?  All products:  Tell dentists, surgeons, and other doctors that you use this drug.  This drug may raise the chance of a broken leg. Talk with your doctor.  Have your blood work checked. Talk with your doctor.  Have a bone density test. Talk with your doctor.  Take calcium and vitamin D as you were told by your doctor.  Have a dental exam before starting this drug.  Take good care of your teeth. See a dentist often.  If you smoke, talk with your doctor.  Do not give to a child. Talk with your doctor.  Tell your doctor if you are breast-feeding. You will need to talk about any risks to your baby.  Delton See:  This drug may cause harm to the unborn baby if you take it while you are pregnant. If you get pregnant while taking this drug, call your doctor right away.  Prolia:  Very bad infections have been reported with use of this drug. If you have any infection, are taking antibiotics now or in the recent past, or have many infections, talk with your doctor.  You may have more chance of getting an infection. Wash hands often. Stay away from people with infections, colds, or flu.  Use birth control that you can trust to prevent pregnancy while taking this drug.  If you are a man and your sex partner is pregnant or gets pregnant at any time while you are being treated, talk with your doctor. What are some side effects that I need to call my doctor about right away?  WARNING/CAUTION: Even though it may be rare, some people may have very bad and sometimes deadly side effects when taking a drug. Tell your doctor or get medical help right away if you have any of the following signs or symptoms that may be related to a very bad side effect:  All products:  Signs of an allergic reaction, like rash; hives; itching; red, swollen, blistered, or peeling skin with or without fever; wheezing; tightness in the chest or throat;  trouble breathing or talking; unusual hoarseness; or swelling of the mouth, face, lips, tongue, or throat.  Signs of low calcium levels like muscle cramps or spasms, numbness and tingling, or seizures.  Mouth sores.  Any new or strange groin, hip, or thigh pain.  This drug may cause jawbone problems. The chance may be higher the longer you take this drug. The chance may be higher if you have cancer, dental problems, dentures that do not fit well, anemia, blood clotting problems, or an infection. The chance may also be higher if you are having  dental work or if you are getting chemo, some steroid drugs, or radiation. Call your doctor right away if you have jaw swelling or pain.  Xgeva:  Not hungry.  Muscle pain or weakness.  Seizures.  Shortness of breath.  Prolia:  Signs of infection. These include a fever of 100.35F (38C) or higher, chills, very bad sore throat, ear or sinus pain, cough, more sputum or change in color of sputum, pain with passing urine, mouth sores, wound that will not heal, or anal itching or pain.  Signs of a pancreas problem (pancreatitis) like very bad stomach pain, very bad back pain, or very bad upset stomach or throwing up.  Chest pain.  A heartbeat that does not feel normal.  Very bad skin irritation.  Feeling very tired or weak.  Bladder pain or pain when passing urine or change in how much urine is passed.  Passing urine often.  Swelling in the arms or legs. What are some other side effects of this drug?  All drugs may cause side effects. However, many people have no side effects or only have minor side effects. Call your doctor or get medical help if any of these side effects or any other side effects bother you or do not go away:  Xgeva:  Feeling tired or weak.  Headache.  Upset stomach or throwing up.  Loose stools (diarrhea).  Cough.  Prolia:  Back pain.  Muscle or joint pain.  Sore throat.  Runny nose.  Pain in arms or  legs.  These are not all of the side effects that may occur. If you have questions about side effects, call your doctor. Call your doctor for medical advice about side effects.  You may report side effects to your national health agency. How is this drug best taken?  Use this drug as ordered by your doctor. Read and follow the dosing on the label closely.  It is given as a shot into the fatty part of the skin. What do I do if I miss a dose?  Call the doctor to find out what to do. How do I store and/or throw out this drug?  This drug will be given to you in a hospital or doctor's office. You will not store it at home.  Keep all drugs out of the reach of children and pets.  Check with your pharmacist about how to throw out unused drugs.  General drug facts  If your symptoms or health problems do not get better or if they become worse, call your doctor.  Do not share your drugs with others and do not take anyone else's drugs.  Keep a list of all your drugs (prescription, natural products, vitamins, OTC) with you. Give this list to your doctor.  Talk with the doctor before starting any new drug, including prescription or OTC, natural products, or vitamins.  Some drugs may have another patient information leaflet. If you have any questions about this drug, please talk with your doctor, pharmacist, or other health care provider.  If you think there has been an overdose, call your poison control center or get medical care right away. Be ready to tell or show what was taken, how much, and when it happened.

## 2020-05-24 NOTE — Progress Notes (Signed)
69 y.o. G24P1002 Widowed Caucasian female here for annual exam.    Saw Dr. Elvera Lennox, endocrinology. Patient may start Prolia.  Doing gel injections in her knees, and will need to start steroid injections.   Taking Tramadol for back pain.  Seeing Dr. Ethelene Hal.   Wearing sleeves today for both knees.   One sister died in her sleep in 2024-07-28and then another sister died last week with dementia. States she is the last of 4 children.   Getting her booster for Covid on 06/05/20.  Flu vaccine at Dr. Charlean Sanfilippo office.   Will see her PCP in April, 2022.  PCP: Mila Palmer, MD     Patient's last menstrual period was 08/07/2001 (approximate).           Sexually active: No.   The current method of family planning is post menopausal status/Hyst.    Exercising: Yes.    walking Smoker:  no  Health Maintenance: Pap: 05-20-18 Neg, 01-17-17 Neg, 07-17-16 LGSIL:Neg HR HPV History of abnormal Pap:  Yes, 07-17-16 LGSIL:Neg HR HPV MMG: 01-09-20 3D/Neg/density B/BiRads1 Colonoscopy:  IFOB 2020.   BMD:  01-09-20  Result :Osteoporosis of hip, osteopenia of spine--?seeing Dr.Gherghe TDaP:  PCP Gardasil:   no HIV: 01-17-17 NR Hep C: 01-17-17 Neg Screening Labs:  PCP   reports that she has never smoked. She has never used smokeless tobacco. She reports that she does not drink alcohol and does not use drugs.  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 pylori infection    History of blood transfusion 1984   Hx of colonic polyps    Hypercholesteremia    Hyponatremia    Migraines    Vitamin D deficiency     Past Surgical History:  Procedure Laterality Date   ABDOMINAL HYSTERECTOMY     ANTERIOR AND POSTERIOR REPAIR N/A 04/24/2017   Procedure: ANTERIOR (CYSTOCELE) AND POSTERIOR (RECTOCELE)  REPAIR;  Surgeon: Patton Salles, MD;   Location: [facility];  Service: Gynecology;  Laterality: N/A;   CESAREAN SECTION  1984   COLONOSCOPY  2012   COSMETIC SURGERY     Neck lift   CYSTOSCOPY N/A 04/24/2017   Procedure: CYSTOSCOPY;  Surgeon: Patton Salles, MD;  Location: [facility];  Service: Gynecology;  Laterality: N/A;   HEMORRHOID SURGERY  1984   LAPAROSCOPIC LYSIS OF ADHESIONS N/A 04/24/2017   Procedure: EXTENSIVE LAPAROSCOPIC LYSIS OF ADHESIONS;  Surgeon: Patton Salles, MD;  Location: [facility];  Service: Gynecology;  Laterality: N/A;   LAPAROSCOPIC VAGINAL HYSTERECTOMY WITH SALPINGO OOPHORECTOMY Bilateral 04/24/2017   Procedure: LAPAROSCOPIC ASSISTED VAGINAL HYSTERECTOMY WITH SALPINGO OOPHORECTOMY and pelvic washings;  Surgeon: Patton Salles, MD;  Location: [facility];  Service: Gynecology;  Laterality: Bilateral;  3 hours   RECTAL PROLAPSE REPAIR     Dr. Kendrick Ranch   UMBILICAL HERNIA REPAIR     had surgery as a child.    Current Outpatient Medications  Medication Sig Dispense Refill   Calcium 600-200 MG-UNIT tablet Take 1 tablet by mouth daily.     docusate sodium (COLACE) 100 MG capsule Take 100 mg by mouth at bedtime.      OnabotulinumtoxinA (BOTOX IJ)      polyethylene glycol (MIRALAX / GLYCOLAX) packet Take 17 g by  mouth at bedtime.      promethazine (PHENERGAN) 25 MG tablet Take 1 to 2 tablets every 6 hours as needed for nausea. 30 tablet 5   Simethicone (PHAZYME MAXIMUM STRENGTH) 250 MG CAPS Take 250 mg by mouth daily as needed (gas).     traMADol (ULTRAM) 50 MG tablet Take 1 tablet by mouth 4 (four) times daily as needed.     Ubrogepant (UBRELVY) 100 MG TABS Take 1 tablet by mouth as needed (May repeat 1 tablet in 2 hours.  Maximum 2 tablets in 24 hours). 10 tablet 3   XIIDRA 5 % SOLN Place 1 drop into both eyes 2 (two) times daily.      zonisamide (ZONEGRAN) 50 MG capsule Take 1 capsule (50 mg total) by mouth daily. 30 capsule 5   No current facility-administered  medications for this visit.    Family History  Problem Relation Age of Onset   Migraines Mother    Heart attack Mother    Migraines Sister    Cancer Father        Prostate    Review of Systems  Constitutional: Negative.   HENT: Negative.   Eyes: Negative.   Respiratory: Negative.   Cardiovascular: Negative.   Gastrointestinal: Negative.   Endocrine: Negative.   Genitourinary: Negative.   Musculoskeletal: Negative.   Skin: Negative.   Allergic/Immunologic: Negative.   Neurological: Negative.   Hematological: Negative.   Psychiatric/Behavioral: Negative.     Exam:   BP 136/78 (BP Location: Left Arm, Patient Position: Sitting, Cuff Size: Normal)    Pulse 88    Resp 14    Ht 4' 11.25" (1.505 m)    Wt 142 lb (64.4 kg)    LMP 08/07/2001 (Approximate)    BMI 28.44 kg/m     General appearance: alert, cooperative and appears stated age. Tearful during visit.  Head: normocephalic, without obvious abnormality, atraumatic Neck: no adenopathy, supple, symmetrical, trachea midline and thyroid normal to inspection and palpation Lungs: clear to auscultation bilaterally Breasts: normal appearance, no masses or tenderness, No nipple retraction or dimpling, No nipple discharge or bleeding, No axillary adenopathy Heart: regular rate and rhythm Abdomen: soft, non-tender; no masses, no organomegaly Extremities: extremities normal, atraumatic, no cyanosis or edema Skin: skin color, texture, turgor normal. No rashes or lesions Lymph nodes: cervical, supraclavicular, and axillary nodes normal. Neurologic: grossly normal  Pelvic: External genitalia:  no lesions              No abnormal inguinal nodes palpated.              Urethra:  normal appearing urethra with no masses, tenderness or lesions              Bartholins and Skenes: normal                 Vagina: normal appearing vagina with normal color and discharge, atrophy noted.               Cervix: absent              Pap taken:  No. Bimanual Exam:  Uterus:  absent              Adnexa: no mass, fullness, tenderness              Rectal exam: Yes.  .  Confirms.              Anus:  normal sphincter tone, no lesions  Chaperone was present  for exam.  Assessment:   Well woman visit with normal exam. Status post LAVH/BSO/anterior and posterior colporrhaphy. Osteoporosis.  Hx vulvar pain.   Resolved.  Migraines.  Bereavement.   Plan: Mammogram screening discussed. Self breast awareness reviewed. Pap and HR HPV not needed. Will biopsy if lesion is detected.  Guidelines for Calcium, Vitamin D, regular exercise program including cardiovascular and weight bearing exercise. Labs with PCP.  Bereavement support given for the loss of her siblings.  Fu annually and prn.

## 2020-05-25 ENCOUNTER — Ambulatory Visit (INDEPENDENT_AMBULATORY_CARE_PROVIDER_SITE_OTHER): Payer: Medicare PPO | Admitting: Obstetrics and Gynecology

## 2020-05-25 ENCOUNTER — Other Ambulatory Visit: Payer: Self-pay

## 2020-05-25 ENCOUNTER — Encounter: Payer: Self-pay | Admitting: Obstetrics and Gynecology

## 2020-05-25 VITALS — BP 136/78 | HR 88 | Resp 14 | Ht 59.25 in | Wt 142.0 lb

## 2020-05-25 DIAGNOSIS — Z01419 Encounter for gynecological examination (general) (routine) without abnormal findings: Secondary | ICD-10-CM

## 2020-05-25 NOTE — Patient Instructions (Signed)

## 2020-06-05 ENCOUNTER — Ambulatory Visit: Payer: Medicare PPO | Attending: Internal Medicine

## 2020-06-05 DIAGNOSIS — Z23 Encounter for immunization: Secondary | ICD-10-CM

## 2020-06-05 NOTE — Progress Notes (Signed)
   Covid-19 Vaccination Clinic  Name:  [name]    MRN: [id number] DOB: 09-14-1950  06/05/2020  [name] was observed post Covid-19 immunization for 15 minutes without incident. She was provided with Vaccine Information Sheet and instruction to access the V-Safe system.   [name] was instructed to call 911 with any severe reactions post vaccine: [name] Difficulty breathing  . Swelling of face and throat  . A fast heartbeat  . A bad rash all over body  . Dizziness and weakness

## 2020-06-15 DIAGNOSIS — M47816 Spondylosis without myelopathy or radiculopathy, lumbar region: Secondary | ICD-10-CM | POA: Diagnosis not present

## 2020-06-15 DIAGNOSIS — G894 Chronic pain syndrome: Secondary | ICD-10-CM | POA: Diagnosis not present

## 2020-06-20 ENCOUNTER — Other Ambulatory Visit: Payer: Self-pay | Admitting: Neurology

## 2020-06-22 ENCOUNTER — Encounter: Payer: Self-pay | Admitting: Neurology

## 2020-06-22 NOTE — Progress Notes (Addendum)
Per BV; SP is [employer] [facility].  11/16- Called [employer] [facility] and set up patient's account and gave verbal script for the botox. And Pharmacist let me schedule delivery for 07/07/20.

## 2020-06-23 DIAGNOSIS — M25562 Pain in left knee: Secondary | ICD-10-CM | POA: Diagnosis not present

## 2020-06-23 DIAGNOSIS — M25561 Pain in right knee: Secondary | ICD-10-CM | POA: Diagnosis not present

## 2020-07-14 ENCOUNTER — Ambulatory Visit (INDEPENDENT_AMBULATORY_CARE_PROVIDER_SITE_OTHER): Payer: Medicare PPO

## 2020-07-14 ENCOUNTER — Other Ambulatory Visit: Payer: Self-pay

## 2020-07-14 ENCOUNTER — Telehealth: Payer: Self-pay | Admitting: Neurology

## 2020-07-14 DIAGNOSIS — Z79899 Other long term (current) drug therapy: Secondary | ICD-10-CM | POA: Diagnosis not present

## 2020-07-14 DIAGNOSIS — Z5181 Encounter for therapeutic drug level monitoring: Secondary | ICD-10-CM | POA: Diagnosis not present

## 2020-07-14 DIAGNOSIS — R519 Headache, unspecified: Secondary | ICD-10-CM

## 2020-07-14 MED ORDER — DIPHENHYDRAMINE HCL 50 MG/ML IJ SOLN
25.0000 mg | Freq: Once | INTRAMUSCULAR | Status: AC
  Administered 2020-07-14: 25 mg via INTRAVENOUS

## 2020-07-14 MED ORDER — KETOROLAC TROMETHAMINE 60 MG/2ML IM SOLN
60.0000 mg | Freq: Once | INTRAMUSCULAR | Status: AC
  Administered 2020-07-14: 60 mg via INTRAMUSCULAR

## 2020-07-14 MED ORDER — METOCLOPRAMIDE HCL 5 MG/ML IJ SOLN
10.0000 mg | Freq: Once | INTRAVENOUS | Status: AC
  Administered 2020-07-14: 10 mg via INTRAMUSCULAR

## 2020-07-14 NOTE — Telephone Encounter (Signed)
Patient called with a migraine. She'd like to know if she can come in this afternoon for a headache cocktail, she has a driver?

## 2020-07-14 NOTE — Telephone Encounter (Signed)
Please advise 

## 2020-07-14 NOTE — Telephone Encounter (Signed)
Yes, that would be fine.

## 2020-07-14 NOTE — Telephone Encounter (Signed)
Patient advised.

## 2020-07-20 DIAGNOSIS — M170 Bilateral primary osteoarthritis of knee: Secondary | ICD-10-CM | POA: Diagnosis not present

## 2020-07-23 ENCOUNTER — Ambulatory Visit: Payer: Medicare PPO | Admitting: Neurology

## 2020-07-23 ENCOUNTER — Ambulatory Visit (INDEPENDENT_AMBULATORY_CARE_PROVIDER_SITE_OTHER): Payer: Medicare PPO | Admitting: Neurology

## 2020-07-23 ENCOUNTER — Other Ambulatory Visit: Payer: Self-pay

## 2020-07-23 DIAGNOSIS — G43709 Chronic migraine without aura, not intractable, without status migrainosus: Secondary | ICD-10-CM | POA: Diagnosis not present

## 2020-07-23 MED ORDER — ONABOTULINUMTOXINA 100 UNITS IJ SOLR
100.0000 [IU] | Freq: Once | INTRAMUSCULAR | Status: AC
  Administered 2020-07-23: 100 [IU] via INTRAMUSCULAR

## 2020-07-23 NOTE — Progress Notes (Signed)
Botulinum Clinic  ° °Procedure Note Botox ° °Attending: Dr. Atianna Haidar ° °Preoperative Diagnosis(es): Chronic migraine ° °Consent obtained from: The patient °Benefits discussed included, but were not limited to decreased muscle tightness, increased joint range of motion, and decreased pain.  Risk discussed included, but were not limited pain and discomfort, bleeding, bruising, excessive weakness, venous thrombosis, muscle atrophy and dysphagia.  Anticipated outcomes of the procedure as well as he risks and benefits of the alternatives to the procedure, and the roles and tasks of the personnel to be involved, were discussed with the patient, and the patient consents to the procedure and agrees to proceed. A copy of the patient medication guide was given to the patient which explains the blackbox warning. ° °Patients identity and treatment sites confirmed Yes.  . ° °Details of Procedure: °Skin was cleaned with alcohol. Prior to injection, the needle plunger was aspirated to make sure the needle was not within a blood vessel.  There was no blood retrieved on aspiration.   ° °Following is a summary of the muscles injected  And the amount of Botulinum toxin used: ° °Dilution °200 units of Botox was reconstituted with 4 ml of preservative free normal saline. °Time of reconstitution: At the time of the office visit (<30 minutes prior to injection)  ° °Injections  °155 total units of Botox was injected with a 30 gauge needle. ° °Injection Sites: °L occipitalis: 15 units- 3 sites  °R occiptalis: 15 units- 3 sites ° °L upper trapezius: 15 units- 3 sites °R upper trapezius: 15 units- 3 sits          °L paraspinal: 10 units- 2 sites °R paraspinal: 10 units- 2 sites ° °Face °L frontalis(2 injection sites):10 units   °R frontalis(2 injection sites):10 units         °L corrugator: 5 units   °R corrugator: 5 units           °Procerus: 5 units   °L temporalis: 20 units °R temporalis: 20 units  ° °Agent:  °200 units of botulinum Type  A (Onobotulinum Toxin type A) was reconstituted with 4 ml of preservative free normal saline.  °Time of reconstitution: At the time of the office visit (<30 minutes prior to injection)  ° ° ° Total injected (Units):  155 ° Total wasted (Units):  45 ° °Patient tolerated procedure well without complications.   °Reinjection is anticipated in 3 months. ° ° °

## 2020-07-26 ENCOUNTER — Telehealth: Payer: Self-pay

## 2020-07-26 ENCOUNTER — Other Ambulatory Visit: Payer: Self-pay | Admitting: Neurology

## 2020-07-26 NOTE — Telephone Encounter (Signed)
The patient was not approved for Prolia through her insurance so Dr. Elvera Lennox would like to try Reclast-I left her a VM requesting she call back to schedule this

## 2020-07-27 ENCOUNTER — Other Ambulatory Visit: Payer: Self-pay | Admitting: Neurology

## 2020-07-27 NOTE — Telephone Encounter (Signed)
Patient called back in and left a message wanting an update on getting some medication

## 2020-07-28 ENCOUNTER — Telehealth: Payer: Self-pay

## 2020-07-28 DIAGNOSIS — M170 Bilateral primary osteoarthritis of knee: Secondary | ICD-10-CM | POA: Diagnosis not present

## 2020-07-28 DIAGNOSIS — M25561 Pain in right knee: Secondary | ICD-10-CM | POA: Diagnosis not present

## 2020-07-28 DIAGNOSIS — M25562 Pain in left knee: Secondary | ICD-10-CM | POA: Diagnosis not present

## 2020-07-28 NOTE — Telephone Encounter (Signed)
Message left by pt, Pt wanted to check on her my chart message she sent in on 12/21.   Left message for pt, My chart message answered at 1:58 pm on 12/22. Please Call us back or respond  to the my chart message if you agree.

## 2020-07-29 ENCOUNTER — Encounter: Payer: Self-pay | Admitting: Neurology

## 2020-07-29 NOTE — Progress Notes (Signed)
[name] (Key: [facility]) [name] 100MG  tablets   Form [employer] Electronic PA Form Created 17 hours ago Sent to Plan 17 hours ago Plan Response 17 hours ago Submit Clinical Questions 17 hours ago Determination Favorable 11 hours ago Message from Plan PA Case: , Status: Approved, Coverage Starts on: 08/08/2019 12:00:00 AM, Coverage Ends on: 08/06/2021 12:00:00 AM. Questions? Contact [phone].

## 2020-08-03 ENCOUNTER — Telehealth: Payer: Self-pay | Admitting: Neurology

## 2020-08-03 DIAGNOSIS — M25561 Pain in right knee: Secondary | ICD-10-CM | POA: Diagnosis not present

## 2020-08-03 DIAGNOSIS — M25562 Pain in left knee: Secondary | ICD-10-CM | POA: Diagnosis not present

## 2020-08-03 NOTE — Telephone Encounter (Signed)
Patient called in wanting to be seen. She said she has had a migraine since 07/28/20 and has missed all of Christmas because she cannot get rid of it. She said she has tried the headache cocktails before and they did not work. Her [name] works for about 3 hours and then it comes back. I don't see any openings before her next visit on 10/11/20.

## 2020-08-03 NOTE — Telephone Encounter (Signed)
Patient is on the wait list.

## 2020-08-03 NOTE — Telephone Encounter (Signed)
Can you add to the waiting list. thanks

## 2020-08-05 ENCOUNTER — Other Ambulatory Visit: Payer: Self-pay

## 2020-08-05 MED ORDER — PREDNISONE 10 MG (21) PO TBPK: ORAL_TABLET | ORAL | 0 refills | Status: DC

## 2020-08-05 NOTE — Telephone Encounter (Signed)
Patient got moved up to Aug 09 2020 to see Dr Everlena Cooper and she sent a mychart message to him today

## 2020-08-08 NOTE — Progress Notes (Deleted)
NEUROLOGY FOLLOW UP OFFICE NOTE  [name] [id number]   Subjective:  [name] is a 70 year old right-handed Caucasian woman with depression, anxiety and history of benzodiazepine and barbiturates dependence and gastric ulcer who follows up for worsening migraines.  UPDATE: She started having worsening migraines ***.  Zonisamide was increased a couple of weeks ago.  She is currently in the middle of a prednisone taper started on Friday, *** Intensity:  severe Duration:  2 hours Frequency:  Was having migraines every 3 days but hasn't had a migraine in a week. Current NSAIDS:contraindicated Current analgesics:Tramadol; Tylenol Current triptans:no Current ergotamine:no Current anti-emetic:Promethazine 25mg -50mg ; Zofran 4mg  Current muscle relaxants:no Current anti-anxiolytic:Librium Current sleep aide:Librium Current Antihypertensive medications:no Current Antidepressant medications:no Current Anticonvulsant medications:zonisamide 100mg  daily Current anti-CGRP:no Current Vitamins/Herbal/Supplements:D Current Antihistamines/Decongestants:no Other therapy:Botox (since 01/03/2019, last injection 10/24/2019) Hormone:estradiol   Caffeine:1 cup 1/2 decaf coffee daily Alcohol:no Smoker:no Diet:hydrates Exercise:Stopped due to torn knee meniscus Depression:no; Anxiety:yes. Other pain:back pain Sleep hygiene:poor  HISTORY: Onset: 2016.She has remote history of migraines which were controlled for many years. In December 2015, her husband unexpectedly passed away, which triggered depression and anxiety. She subsequently recovered. However, in early June2016, she witnessed her boyfriend's ill father pass away. It triggered flashbacks of her husband's death, which increased depression, anxiety and migraines. She has significant history of side effects to multiple medications. Location: Left  peri-orbital Quality: pounding Initial Intensity: 10/10 Aura: no Prodrome: no Associated symptoms: Nausea, photophobia, phonophobia, osmophobia Initial Duration: All day Initial Frequency: 15 days per month Triggers: Emotional stress Relieving factors: Tramadol Activity: aggravates  Past NSAIDS: Oral NSAIDs contraindicated due to bleeding ulcer Past analgesics: Cannot take ASA products such as Excedrin due to bleeding ulcer Past abortive triptans: Relpax (took it multiple times but caused chest tightness once, but was effective), sumatriptan (many years ago) Past anti-emetic: Zofran 8mg  (effective) Past anti-anxiolytic: benzodiazepines Past antihypertensive medications: propranolol (chest tightness) Past antidepressant medications: Multiple, such as Cymbalta. She has had intolerance to multiple antidepressants over the years. Past anticonvulsant medications: topiramate (chest tightness); Depakote ER250mg  daily (higher doses caused increased hunger) Past anti-CGRP: Aimovig(constipation, nausea) Past vitamins/Herbal/Supplements: no  Family history of headache: Mom, sister  MRI and MRA of head from 11/05/10 were personally reviewed and were unremarkable.  PAST MEDICAL HISTORY: Past Medical History:  Diagnosis Date  . Abnormal Pap smear of cervix 07/2016   CIN1  . 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 Constipation   . Depression   . Diverticulosis   . H. pylori infection   . History of blood transfusion 1984  . Hx of colonic polyps   . Hypercholesteremia   . Hyponatremia   . Migraines   . Vitamin D deficiency     MEDICATIONS: Current Outpatient Medications on File Prior to Visit  Medication Sig Dispense Refill  . Calcium 600-200 MG-UNIT tablet Take 1 tablet by mouth daily.    01-11-1996 docusate sodium (COLACE) 100 MG capsule Take 100 mg by mouth at bedtime.     . OnabotulinumtoxinA (BOTOX IJ)     . polyethylene glycol (MIRALAX / GLYCOLAX) packet Take  17 g by mouth at bedtime.     . predniSONE (STERAPRED UNI-PAK 21 TAB) 10 MG (21) TBPK tablet 60mg  day 1, then 50mg  on day 2, then 40mg  on day 3, then 30mg  on day 4, then 20mg  on day 5, then 10mg  on day 6, then STOP. 21 tablet 0  . promethazine (PHENERGAN) 25 MG tablet Take 1 to 2 tablets  every 6 hours as needed for nausea. 30 tablet 5  . Simethicone (PHAZYME MAXIMUM STRENGTH) 250 MG CAPS Take 250 mg by mouth daily as needed (gas).    . traMADol (ULTRAM) 50 MG tablet Take 1 tablet by mouth 4 (four) times daily as needed.    . UBRELVY 100 MG TABS TAKE ONE TABLET AS NEEDED- MAY REPEAT 1 TABLET IN 2 HOURS. MAX OF 2/24 HOURS. 10 tablet 3  . XIIDRA 5 % SOLN Place 1 drop into both eyes 2 (two) times daily.     [name] zonisamide (ZONEGRAN) 50 MG capsule Take 2 capsules (100 mg total) by mouth daily. 60 capsule 5   No current facility-administered medications on file prior to visit.    ALLERGIES: Allergies  Allergen Reactions  . Aspirin Other (See Comments)    Hx of bleeding stomach ulcer   . Crestor [Rosuvastatin Calcium] Itching  . Inderal [Propranolol] Other (See Comments)    Chest tightness  . Macrobid [Nitrofurantoin Monohyd Macro] Diarrhea  . Other     Other reaction(s): Other (See Comments) Cholesterol meds-multiple reactions to multiple meds  . Relpax [Eletriptan Hydrobromide] Other (See Comments)    Chest Tightness   . Salicylates Other (See Comments)    Told not to take due to hx of GI ulcer  . Topamax [Topiramate] Other (See Comments)    Chest tightness    FAMILY HISTORY: Family History  Problem Relation Age of Onset  . Migraines Mother   . Heart attack Mother   . Migraines Sister   . Cancer Father        Prostate    SOCIAL HISTORY: Social History   Socioeconomic History  . Marital status: Widowed    Spouse name: Not on file  . Number of children: 2  . Years of education: Not on file  . Highest education level: Not on file  Occupational History  . Occupation: LPN   Tobacco Use  . Smoking status: Never Smoker  . Smokeless tobacco: Never Used  Vaping Use  . Vaping Use: Never used  Substance and Sexual Activity  . Alcohol use: No  . Drug use: No  . Sexual activity: Not Currently    Birth control/protection: Post-menopausal, Surgical    Comment: Hyst  Other Topics Concern  . 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  Patient appears well-groomed.   Head:  Normocephalic/atraumatic Eyes:  Fundi examined but not visualized Neck: supple, no paraspinal tenderness, full range of motion Heart:  Regular rate and rhythm Lungs:  Clear to auscultation bilaterally Back: No paraspinal tenderness Neurological Exam: alert and oriented to person, place, and time. Attention span and concentration intact, recent and remote memory intact, fund of knowledge intact.  Speech fluent and not dysarthric, language intact.  CN II-XII intact. Bulk and tone normal, muscle strength 5/5 throughout.  Sensation to light touch, temperature and vibration intact.  Deep tendon reflexes 2+ throughout, toes downgoing.  Finger to nose and heel to shin testing intact.  Gait normal, Romberg negative.   Assessment/Plan:   Migraine  without aura, without status migrainosus, intractable  1.  ***  Shon Millet, DO  CC: Mila Palmer, MD

## 2020-08-09 ENCOUNTER — Ambulatory Visit: Payer: Medicare PPO | Admitting: Neurology

## 2020-09-17 ENCOUNTER — Encounter: Payer: Self-pay | Admitting: Neurology

## 2020-09-17 NOTE — Progress Notes (Signed)
[name] Key: [id number] - PA Case ID: [id number] Need help? Call us at [phone] Outcome Approvedon February 10 PA Case: [id number], Status: Approved, Coverage Starts on: 08/07/2020 12:00:00 AM, Coverage Ends on: 08/06/2021 12:00:00 AM. Questions? Contact [phone]. Drug Botox 200UNIT solution Form Merchandiser, retail and Medical Benefit PA Form

## 2020-10-01 ENCOUNTER — Telehealth: Payer: Self-pay

## 2020-10-01 NOTE — Telephone Encounter (Signed)
Prolia VOB initiated via [employer] Portal.  

## 2020-10-06 NOTE — Telephone Encounter (Signed)
PA REQUIRED  Prolia co-insurance: 0% Admin fee: 0%  PRIMARY MEDICAL BENEFIT DETAILS (PHYSICIAN PURCHASE, OR REFERRAL TO TREATING SITE) COVERAGE AVAILABLE: Yes  COVERAGE DETAILS: For the primary MD Purchase option, Prolia and administration will be covered at 100%. No deductible, coinsurance or out of pocket max applies. No referral required. We have provided in network benefits only.  AUTHORIZATION REQUIRED: Yes  PA PROCESS DETAILS: Prior authorization and Step Therapy are required & not on file. To initiate verbally or check status please contact [phone] and/or fax [phone]. Requests can be submitted online thru [employer] or at: [email or web address]

## 2020-10-11 ENCOUNTER — Ambulatory Visit: Payer: Medicare PPO | Admitting: Neurology

## 2020-10-12 NOTE — Telephone Encounter (Addendum)
Approvedon March 9 PA Case: [id number], valid 10/12/2020-08/06/2021    Prior auth initiated via [employer] (Key: [place]le Quant)  Your information has been submitted to [facility]. [employer] will review the request and will issue a decision, typically within 3-7 days from your submission. You can check the updated outcome later by reopening this request.  If [employer] has not responded in 3-7 days or if you have any questions about your ePA request, please contact [employer] at [phone]. If you think there may be a problem with your PA request, use our live chat feature at the bottom right.  For [place] requests, please call [phone].

## 2020-10-14 ENCOUNTER — Other Ambulatory Visit: Payer: Self-pay

## 2020-10-14 MED ORDER — ZONISAMIDE 100 MG PO CAPS: ORAL_CAPSULE | ORAL | 4 refills | Status: DC

## 2020-10-14 NOTE — Telephone Encounter (Signed)
Pt ready for scheduling.   Prolia co-insurance: 0% Admin fee co-insuance: 0%  Pt cost: $0.00

## 2020-10-14 NOTE — Progress Notes (Signed)
Per Dr.Jaffe pt to take 200 mg daily.

## 2020-10-22 ENCOUNTER — Ambulatory Visit: Payer: Medicare PPO | Admitting: Neurology

## 2020-10-22 ENCOUNTER — Other Ambulatory Visit: Payer: Self-pay

## 2020-10-22 DIAGNOSIS — G43709 Chronic migraine without aura, not intractable, without status migrainosus: Secondary | ICD-10-CM

## 2020-10-22 MED ORDER — ONABOTULINUMTOXINA 100 UNITS IJ SOLR
200.0000 [IU] | Freq: Once | INTRAMUSCULAR | Status: AC
  Administered 2020-10-22: 155 [IU] via INTRAMUSCULAR

## 2020-10-22 NOTE — Telephone Encounter (Signed)
Called patient to schedule Prolia, she had questions about the injection that I was not able to answer, since I am not clinical. She asked if someone could give her a call to discuss at [phone] - she asked if someone could call her Monday because she's not able to answer her phone today.

## 2020-10-22 NOTE — Patient Instructions (Addendum)
We will order MRI of brain with and without contrast  We have sent a referral to [facility] for your MRI and they will call you directly to schedule your appointment.  They are located at [address] [facility].  If you need to contact them directly, please call [phone].

## 2020-10-22 NOTE — Progress Notes (Signed)
Patient has had daily headaches for months now.  Possible trigger may be enduring the loss of her two sisters and brother-in-law all in July.  It is still very difficult for her to talk about without crying.  However, I think we should still rule out a secondary intracranial etiology.  Will check MRI of brain with and without contrast.

## 2020-10-22 NOTE — Progress Notes (Signed)
[facility]   Procedure Note Botox  Attending: Dr. Adam Jaffe  Preoperative Diagnosis(es): Chronic migraine  Consent obtained from: The patient Benefits discussed included, but were not limited to decreased muscle tightness, increased joint range of motion, and decreased pain.  Risk discussed included, but were not limited pain and discomfort, bleeding, bruising, excessive weakness, venous thrombosis, muscle atrophy and dysphagia.  Anticipated outcomes of the procedure as well as he risks and benefits of the alternatives to the procedure, and the roles and tasks of the personnel to be involved, were discussed with the patient, and the patient consents to the procedure and agrees to proceed. A copy of the patient medication guide was given to the patient which explains the blackbox warning.  Patients identity and treatment sites confirmed Yes.  .  Details of Procedure: Skin was cleaned with alcohol. Prior to injection, the needle plunger was aspirated to make sure the needle was not within a blood vessel.  There was no blood retrieved on aspiration.    Following is a summary of the muscles injected  And the amount of Botulinum toxin used:  Dilution 200 units of Botox was reconstituted with 4 ml of preservative free normal saline. Time of reconstitution: At the time of the office visit (<30 minutes prior to injection)   Injections  155 total units of Botox was injected with a 30 gauge needle.  Injection Sites: L occipitalis: 15 units- 3 sites  R occiptalis: 15 units- 3 sites  L upper trapezius: 15 units- 3 sites R upper trapezius: 15 units- 3 sits          L paraspinal: 10 units- 2 sites R paraspinal: 10 units- 2 sites  Face L frontalis(2 injection sites):10 units   R frontalis(2 injection sites):10 units         L corrugator: 5 units   R corrugator: 5 units           Procerus: 5 units   L temporalis: 20 units R temporalis: 20 units   Agent:  200 units of botulinum Type  A (Onobotulinum Toxin type A) was reconstituted with 4 ml of preservative free normal saline.  Time of reconstitution: At the time of the office visit (<30 minutes prior to injection)     Total injected (Units): 155  Total wasted (Units): none wasted  Patient tolerated procedure well without complications.   Reinjection is anticipated in 3 months.    

## 2020-10-28 NOTE — Telephone Encounter (Signed)
Called and left a message advising pt to call back with questions.

## 2020-11-04 NOTE — Telephone Encounter (Signed)
A user error has taken place: encounter opened in error, closed for administrative reasons.

## 2020-11-09 ENCOUNTER — Other Ambulatory Visit: Payer: Self-pay | Admitting: Obstetrics and Gynecology

## 2020-11-09 DIAGNOSIS — Z1231 Encounter for screening mammogram for malignant neoplasm of breast: Secondary | ICD-10-CM

## 2020-11-10 DIAGNOSIS — Z79891 Long term (current) use of opiate analgesic: Secondary | ICD-10-CM | POA: Diagnosis not present

## 2020-11-10 DIAGNOSIS — M5136 Other intervertebral disc degeneration, lumbar region: Secondary | ICD-10-CM | POA: Diagnosis not present

## 2020-11-17 ENCOUNTER — Ambulatory Visit
Admission: RE | Admit: 2020-11-17 | Discharge: 2020-11-17 | Disposition: A | Discharge status: Home / Self Care | Payer: Medicare PPO | Source: Ambulatory Visit | Attending: Neurology | Admitting: Neurology

## 2020-11-17 DIAGNOSIS — J3489 Other specified disorders of nose and nasal sinuses: Secondary | ICD-10-CM | POA: Diagnosis not present

## 2020-11-17 DIAGNOSIS — G43709 Chronic migraine without aura, not intractable, without status migrainosus: Secondary | ICD-10-CM

## 2020-11-17 DIAGNOSIS — J320 Chronic maxillary sinusitis: Secondary | ICD-10-CM | POA: Diagnosis not present

## 2020-11-17 DIAGNOSIS — I6782 Cerebral ischemia: Secondary | ICD-10-CM | POA: Diagnosis not present

## 2020-11-17 DIAGNOSIS — H748X2 Other specified disorders of left middle ear and mastoid: Secondary | ICD-10-CM | POA: Diagnosis not present

## 2020-11-17 MED ORDER — GADOBENATE DIMEGLUMINE 529 MG/ML IV SOLN
13.0000 mL | Freq: Once | INTRAVENOUS | Status: AC | PRN
  Administered 2020-11-17: 13 mL via INTRAVENOUS

## 2020-11-18 NOTE — Progress Notes (Signed)
Pt advised of her MRI results.

## 2020-11-29 DIAGNOSIS — Z79899 Other long term (current) drug therapy: Secondary | ICD-10-CM | POA: Diagnosis not present

## 2020-11-29 DIAGNOSIS — Z86010 Personal history of colonic polyps: Secondary | ICD-10-CM | POA: Diagnosis not present

## 2020-11-29 DIAGNOSIS — E7800 Pure hypercholesterolemia, unspecified: Secondary | ICD-10-CM | POA: Diagnosis not present

## 2020-11-29 DIAGNOSIS — M8580 Other specified disorders of bone density and structure, unspecified site: Secondary | ICD-10-CM | POA: Diagnosis not present

## 2020-11-29 DIAGNOSIS — G43909 Migraine, unspecified, not intractable, without status migrainosus: Secondary | ICD-10-CM | POA: Diagnosis not present

## 2020-11-29 DIAGNOSIS — Z0000 Encounter for general adult medical examination without abnormal findings: Secondary | ICD-10-CM | POA: Diagnosis not present

## 2020-11-29 DIAGNOSIS — R7301 Impaired fasting glucose: Secondary | ICD-10-CM | POA: Diagnosis not present

## 2020-11-29 DIAGNOSIS — E559 Vitamin D deficiency, unspecified: Secondary | ICD-10-CM | POA: Diagnosis not present

## 2020-11-29 DIAGNOSIS — Z8719 Personal history of other diseases of the digestive system: Secondary | ICD-10-CM | POA: Diagnosis not present

## 2020-11-29 DIAGNOSIS — K5901 Slow transit constipation: Secondary | ICD-10-CM | POA: Diagnosis not present

## 2020-11-29 NOTE — Progress Notes (Signed)
NEUROLOGY FOLLOW UP OFFICE NOTE  [name] [id number]  Assessment/Plan:   1.  Chronic migraine - recent status migrainosus - possibly related to sinusitis.  Regardless, improved  1.  Follow up for Botox in June 2.  Zonisamide 200mg  daily 3.  Ubrelvy 100mg  as needed  Subjective:  [name] is a 70 year old right-handed Caucasian woman with depression, anxiety and history of benzodiazepine and barbiturates dependence and gastric ulcer who follows up for migraines.  UPDATE: She started having constant headache.  MRI of brain with and without contrast on 11/17/2020 personally reviewed and showed possible left paranasal sinusitis but nothing concerning.  Zonisamide increased to 200mg  daily.  Headache broke about 2 weeks ago.  She is now doing better.  No migraines in past 2 weeks.  She has a slight headache in the morning that resolves with coffee.   Current NSAIDS:contraindicated Current analgesics:Tramadol; Tylenol Current triptans:no Current ergotamine:no Current anti-emetic:Promethazine 25mg -50mg ; Zofran 4mg  Current muscle relaxants:no Current anti-anxiolytic:Librium Current sleep aide:Librium Current Antihypertensive medications:no Current Antidepressant medications:no Current Anticonvulsant medications:zonisamide 200mg  daily Current anti-CGRP:Ubrelvy 100mg  Current Vitamins/Herbal/Supplements:D Current Antihistamines/Decongestants:no Other therapy:Botox (since 01/03/2019, last injection 10/22/2020) Hormone:estradiol  She reports worsening short-term memory problems.  She needs to keep sticky notes as reminders.  3 or 4 weeks ago, her thyroid level was "low" and more tests were ordered.  She does have increased emotional stress related to her chronic back pain, her sister, and possibly COVID.  Her sister has dementia  Caffeine:1 cup 1/2 decaf coffee daily Alcohol:no Smoker:no Diet:hydrates Exercise:Stopped due  to torn knee meniscus Depression:no; Anxiety:yes. Other pain:back pain Sleep hygiene:poor  HISTORY: Onset: 2016.She has remote history of migraines which were controlled for many years. In December 2015, her husband unexpectedly passed away, which triggered depression and anxiety. She subsequently recovered. However, in early June2016, she witnessed her boyfriend's ill father pass away. It triggered flashbacks of her husband's death, which increased depression, anxiety and migraines. She has significant history of side effects to multiple medications. Location: Left peri-orbital Quality: pounding Initial Intensity: 10/10 Aura: no Prodrome: no Associated symptoms: Nausea, photophobia, phonophobia, osmophobia Initial Duration: All day Initial Frequency: 15 days per month Triggers: Emotional stress Relieving factors: Tramadol Activity: aggravates  Past NSAIDS: Oral NSAIDs contraindicated due to bleeding ulcer Past analgesics: Cannot take ASA products such as Excedrin due to bleeding ulcer Past abortive triptans: Relpax (took it multiple times but caused chest tightness once, but was effective), sumatriptan (many years ago) Past anti-emetic: Zofran 8mg  (effective) Past anti-anxiolytic: benzodiazepines Past antihypertensive medications: propranolol (chest tightness) Past antidepressant medications: Multiple, such as Cymbalta. She has had intolerance to multiple antidepressants over the years. Past anticonvulsant medications: topiramate (chest tightness); Depakote ER250mg  daily (higher doses caused increased hunger) Past anti-CGRP: Aimovig(constipation, nausea) Past vitamins/Herbal/Supplements: no  Family history of headache: Mom, sister  MRI and MRA of head from 11/05/10 were personally reviewed and were unremarkable.  PAST MEDICAL HISTORY: Past Medical History:  Diagnosis Date  . Abnormal Pap smear of cervix 07/2016   CIN1  . Arthritis 2020    back, bilateral knees  . Constipation   . Depression   . Diverticulosis   . H. pylori infection   . History of blood transfusion 1984  . Hx of colonic polyps   . Hypercholesteremia   . Hyponatremia   . Migraines   . Vitamin D deficiency     MEDICATIONS: Current Outpatient Medications on File Prior to Visit  Medication Sig Dispense Refill  . Calcium 600-200 MG-UNIT tablet Take 1  tablet by mouth daily.    [name] docusate sodium (COLACE) 100 MG capsule Take 100 mg by mouth at bedtime.     . OnabotulinumtoxinA (BOTOX IJ)     . polyethylene glycol (MIRALAX / GLYCOLAX) packet Take 17 g by mouth at bedtime.     . predniSONE (STERAPRED UNI-PAK 21 TAB) 10 MG (21) TBPK tablet 60mg  day 1, then 50mg  on day 2, then 40mg  on day 3, then 30mg  on day 4, then 20mg  on day 5, then 10mg  on day 6, then STOP. 21 tablet 0  . promethazine (PHENERGAN) 25 MG tablet Take 1 to 2 tablets every 6 hours as needed for nausea. 30 tablet 5  . Simethicone (PHAZYME MAXIMUM STRENGTH) 250 MG CAPS Take 250 mg by mouth daily as needed (gas).    . traMADol (ULTRAM) 50 MG tablet Take 1 tablet by mouth 4 (four) times daily as needed.    . UBRELVY 100 MG TABS TAKE ONE TABLET AS NEEDED- MAY REPEAT 1 TABLET IN 2 HOURS. MAX OF 2/24 HOURS. 10 tablet 3  . XIIDRA 5 % SOLN Place 1 drop into both eyes 2 (two) times daily.     zonisamide (ZONEGRAN) 100 MG capsule Take 2 cap daily 60 capsule 4  . zonisamide (ZONEGRAN) 50 MG capsule Take 2 capsules (100 mg total) by mouth daily. 60 capsule 5   No current facility-administered medications on file prior to visit.    ALLERGIES: Allergies  Allergen Reactions  . Aspirin Other (See Comments)    Hx of bleeding stomach ulcer   . Crestor [Rosuvastatin Calcium] Itching  . Inderal [Propranolol] Other (See Comments)    Chest tightness  . Macrobid [Nitrofurantoin Monohyd Macro] Diarrhea  . Other     Other reaction(s): Other (See Comments) Cholesterol meds-multiple reactions to multiple meds   . Relpax [Eletriptan Hydrobromide] Other (See Comments)    Chest Tightness   . Salicylates Other (See Comments)    Told not to take due to hx of GI ulcer  . Topamax [Topiramate] Other (See Comments)    Chest tightness    FAMILY HISTORY: Family History  Problem Relation Age of Onset  . Migraines Mother   . Heart attack Mother   . Migraines Sister   . Cancer Father        Prostate      Objective:  Blood pressure (!) 147/82, pulse 80, height 5' (1.524 m), weight 151 lb 6.4 oz (68.7 kg), last menstrual period 08/07/2001, SpO2 98 %. General: No acute distress.  Patient appears well-groomed.   Head:  Normocephalic/atraumatic Eyes:  Fundi examined but not visualized Neck: supple, no paraspinal tenderness, full range of motion Heart:  Regular rate and rhythm Lungs:  Clear to auscultation bilaterally Back: No paraspinal tenderness Neurological Exam: alert and oriented.  Speech fluent and not dysarthric, language intact.  CN II-XII intact. Bulk and tone normal, muscle strength 5/5 throughout.  Sensation to light touch  intact.  Deep tendon reflexes 2+ throughout.  Finger to nose testing intact.  Gait normal, Romberg negative.     , DO  CC: , MD

## 2020-12-01 ENCOUNTER — Other Ambulatory Visit: Payer: Self-pay

## 2020-12-01 ENCOUNTER — Encounter: Payer: Self-pay | Admitting: Neurology

## 2020-12-01 ENCOUNTER — Ambulatory Visit: Payer: Medicare PPO | Admitting: Neurology

## 2020-12-01 VITALS — BP 147/82 | HR 80 | Ht 60.0 in | Wt 151.4 lb

## 2020-12-01 DIAGNOSIS — G43709 Chronic migraine without aura, not intractable, without status migrainosus: Secondary | ICD-10-CM

## 2020-12-01 NOTE — Patient Instructions (Signed)
1.  Follow up in June for Botox 2.  Continue zonisamide 200mg  daily 3.  Use Ubrelvy as needed

## 2021-01-10 ENCOUNTER — Other Ambulatory Visit: Payer: Self-pay

## 2021-01-10 ENCOUNTER — Ambulatory Visit
Admission: RE | Admit: 2021-01-10 | Discharge: 2021-01-10 | Disposition: A | Discharge status: Home / Self Care | Payer: Medicare PPO | Source: Ambulatory Visit | Attending: Obstetrics and Gynecology | Admitting: Obstetrics and Gynecology

## 2021-01-10 DIAGNOSIS — Z1231 Encounter for screening mammogram for malignant neoplasm of breast: Secondary | ICD-10-CM | POA: Diagnosis not present

## 2021-01-16 NOTE — Telephone Encounter (Signed)
Please follow up with pt regarding scheduling Prolia inj.  Thanks! 

## 2021-01-21 ENCOUNTER — Ambulatory Visit (INDEPENDENT_AMBULATORY_CARE_PROVIDER_SITE_OTHER): Payer: Medicare PPO | Admitting: Neurology

## 2021-01-21 ENCOUNTER — Other Ambulatory Visit: Payer: Self-pay

## 2021-01-21 DIAGNOSIS — G43709 Chronic migraine without aura, not intractable, without status migrainosus: Secondary | ICD-10-CM | POA: Diagnosis not present

## 2021-01-21 MED ORDER — ONABOTULINUMTOXINA 100 UNITS IJ SOLR
200.0000 [IU] | Freq: Once | INTRAMUSCULAR | Status: AC
  Administered 2021-01-21: 155 [IU] via INTRAMUSCULAR

## 2021-01-21 NOTE — Progress Notes (Signed)
[facility]   Procedure Note Botox  Attending: Dr. Neng Albee  Preoperative Diagnosis(es): Chronic migraine  Consent obtained from: The patient Benefits discussed included, but were not limited to decreased muscle tightness, increased joint range of motion, and decreased pain.  Risk discussed included, but were not limited pain and discomfort, bleeding, bruising, excessive weakness, venous thrombosis, muscle atrophy and dysphagia.  Anticipated outcomes of the procedure as well as he risks and benefits of the alternatives to the procedure, and the roles and tasks of the personnel to be involved, were discussed with the patient, and the patient consents to the procedure and agrees to proceed. A copy of the patient medication guide was given to the patient which explains the blackbox warning.  Patients identity and treatment sites confirmed Yes.  .  Details of Procedure: Skin was cleaned with alcohol. Prior to injection, the needle plunger was aspirated to make sure the needle was not within a blood vessel.  There was no blood retrieved on aspiration.    Following is a summary of the muscles injected  And the amount of Botulinum toxin used:  Dilution 200 units of Botox was reconstituted with 4 ml of preservative free normal saline. Time of reconstitution: At the time of the office visit (<30 minutes prior to injection)   Injections  155 total units of Botox was injected with a 30 gauge needle.  Injection Sites: L occipitalis: 15 units- 3 sites  R occiptalis: 15 units- 3 sites  L upper trapezius: 15 units- 3 sites R upper trapezius: 15 units- 3 sits          L paraspinal: 10 units- 2 sites R paraspinal: 10 units- 2 sites  Face L frontalis(2 injection sites):10 units   R frontalis(2 injection sites):10 units         L corrugator: 5 units   R corrugator: 5 units           Procerus: 5 units   L temporalis: 20 units R temporalis: 20 units   Agent:  200 units of botulinum Type  A (Onobotulinum Toxin type A) was reconstituted with 4 ml of preservative free normal saline.  Time of reconstitution: At the time of the office visit (<30 minutes prior to injection)     Total injected (Units): 155  Total wasted (Units): none wasted  Patient tolerated procedure well without complications.   Reinjection is anticipated in 3 months.    

## 2021-01-25 NOTE — Telephone Encounter (Signed)
LMTCB to schedule prolia 

## 2021-02-22 DIAGNOSIS — M13862 Other specified arthritis, left knee: Secondary | ICD-10-CM | POA: Diagnosis not present

## 2021-02-22 DIAGNOSIS — M13861 Other specified arthritis, right knee: Secondary | ICD-10-CM | POA: Diagnosis not present

## 2021-03-01 DIAGNOSIS — M13862 Other specified arthritis, left knee: Secondary | ICD-10-CM | POA: Diagnosis not present

## 2021-03-01 DIAGNOSIS — M13861 Other specified arthritis, right knee: Secondary | ICD-10-CM | POA: Diagnosis not present

## 2021-03-08 DIAGNOSIS — M13861 Other specified arthritis, right knee: Secondary | ICD-10-CM | POA: Diagnosis not present

## 2021-03-08 DIAGNOSIS — M13862 Other specified arthritis, left knee: Secondary | ICD-10-CM | POA: Diagnosis not present

## 2021-03-11 DIAGNOSIS — M5136 Other intervertebral disc degeneration, lumbar region: Secondary | ICD-10-CM | POA: Diagnosis not present

## 2021-03-11 DIAGNOSIS — Z79891 Long term (current) use of opiate analgesic: Secondary | ICD-10-CM | POA: Diagnosis not present

## 2021-03-11 DIAGNOSIS — M5459 Other low back pain: Secondary | ICD-10-CM | POA: Diagnosis not present

## 2021-04-07 NOTE — Telephone Encounter (Signed)
Prolia VOB initiated via [email or web address]  Last OV: 05/24/20 Next OV: 05/24/21 Last Prolia inj:  Next Prolia inj DUE:

## 2021-04-11 NOTE — Telephone Encounter (Addendum)
Pt ready for scheduling on or after 04/11/21  Out-of-pocket cost due at time of visit: $0.00  Primary: [employer] Prolia co-insurance: 0% Admin fee co-insurance: 0%  Secondary: n/a Prolia co-insurance:  Admin fee co-insurance:   Deductible: does not apply  Prior Auth: APPROVED PA# [id number] Valid: 10/12/20-08/06/21    ** This summary of benefits is an estimation of the patient's out-of-pocket cost. Exact cost may very based on individual plan coverage.

## 2021-04-22 ENCOUNTER — Ambulatory Visit (INDEPENDENT_AMBULATORY_CARE_PROVIDER_SITE_OTHER): Payer: Medicare PPO

## 2021-04-22 ENCOUNTER — Ambulatory Visit: Payer: Medicare PPO | Admitting: Neurology

## 2021-04-22 ENCOUNTER — Other Ambulatory Visit: Payer: Self-pay

## 2021-04-22 DIAGNOSIS — G43709 Chronic migraine without aura, not intractable, without status migrainosus: Secondary | ICD-10-CM | POA: Diagnosis not present

## 2021-04-22 DIAGNOSIS — M810 Age-related osteoporosis without current pathological fracture: Secondary | ICD-10-CM

## 2021-04-22 MED ORDER — ONABOTULINUMTOXINA 100 UNITS IJ SOLR
200.0000 [IU] | Freq: Once | INTRAMUSCULAR | Status: AC
  Administered 2021-04-22: 155 [IU] via INTRAMUSCULAR

## 2021-04-22 MED ORDER — DENOSUMAB 60 MG/ML ~~LOC~~ SOSY
60.0000 mg | PREFILLED_SYRINGE | Freq: Once | SUBCUTANEOUS | Status: AC
  Administered 2021-04-22: 60 mg via SUBCUTANEOUS

## 2021-04-22 NOTE — Telephone Encounter (Signed)
Patient had Prolia injection 04/22/21 - went ahead and made her next 6 month appointment.  Notes show authorization is good until 08/06/21   Patient would like to be advised when authorization for next shot is completed and what cost she may have - call # [phone]

## 2021-04-22 NOTE — Progress Notes (Signed)
Prolia injection administered to pt's left arm. Pt tolerated well and after 15 minutes of supervision pt was cleared to leave the clinic.

## 2021-04-22 NOTE — Progress Notes (Signed)
[facility]   Procedure Note Botox  Attending: Dr. Lonzell Dorris  Preoperative Diagnosis(es): Chronic migraine  Consent obtained from: The patient Benefits discussed included, but were not limited to decreased muscle tightness, increased joint range of motion, and decreased pain.  Risk discussed included, but were not limited pain and discomfort, bleeding, bruising, excessive weakness, venous thrombosis, muscle atrophy and dysphagia.  Anticipated outcomes of the procedure as well as he risks and benefits of the alternatives to the procedure, and the roles and tasks of the personnel to be involved, were discussed with the patient, and the patient consents to the procedure and agrees to proceed. A copy of the patient medication guide was given to the patient which explains the blackbox warning.  Patients identity and treatment sites confirmed Yes.  .  Details of Procedure: Skin was cleaned with alcohol. Prior to injection, the needle plunger was aspirated to make sure the needle was not within a blood vessel.  There was no blood retrieved on aspiration.    Following is a summary of the muscles injected  And the amount of Botulinum toxin used:  Dilution 200 units of Botox was reconstituted with 4 ml of preservative free normal saline. Time of reconstitution: At the time of the office visit (<30 minutes prior to injection)   Injections  155 total units of Botox was injected with a 30 gauge needle.  Injection Sites: L occipitalis: 15 units- 3 sites  R occiptalis: 15 units- 3 sites  L upper trapezius: 15 units- 3 sites R upper trapezius: 15 units- 3 sits          L paraspinal: 10 units- 2 sites R paraspinal: 10 units- 2 sites  Face L frontalis(2 injection sites):10 units   R frontalis(2 injection sites):10 units         L corrugator: 5 units   R corrugator: 5 units           Procerus: 5 units   L temporalis: 20 units R temporalis: 20 units   Agent:  200 units of botulinum Type  A (Onobotulinum Toxin type A) was reconstituted with 4 ml of preservative free normal saline.  Time of reconstitution: At the time of the office visit (<30 minutes prior to injection)     Total injected (Units): 155  Total wasted (Units): none wasted  Patient tolerated procedure well without complications.   Reinjection is anticipated in 3 months.    

## 2021-05-24 ENCOUNTER — Ambulatory Visit: Payer: Medicare PPO | Admitting: Internal Medicine

## 2021-05-24 NOTE — Progress Notes (Deleted)
Patient ID: [name], female   DOB: [birth year], 70 y.o.   MRN: [id number]   This visit occurred during the SARS-CoV-2 public health emergency.  Safety protocols were in place, including screening questions prior to the visit, additional usage of staff PPE, and extensive cleaning of exam room while observing appropriate contact time as indicated for disinfecting solutions.   HPI  [name] is a 70 y.o.-year-old female, initially referred by her OB/GYN doctor, Dr. Edward Jolly, for management of osteoporosis (OP).  Last visit 1 year ago.  Interim history: No falls or fractures since last visit. No vision disturbance, vertigo, disequilibrium, orthostasis.  Reviewed and addended history: Pt was dx with OP in 01/2020.  I reviewed pt's DXA scans: Date L1-L4 T score FN T score 33% distal Radius  01/09/2020 ([facility]) -1.5 (-1.2%) RFN: -2.7 LFN: -2.8 (-6.9%*) n/a  08/29/2017 ([facility]) -1.4 RFN: -2.3 LFN: -2.4 (-14.1%*) n/a   Of note, she has left hip osteoarthritis and got steroid injections. However, last 2 injections were with gel (the last was completely ineffective). She also has steroid inj's in knees.   She denies fractures.  No falls.    No dizziness/vertigo/orthostasis/poor vision. Her house is on one level.  She lives alone.  Previous OP treatments:  - Dr. Edward Jolly recommended Fosamax 70 mg weekly at patient's visit from 02/2020 >> stomach pain. - we started Prolia on 04/22/2021 -this was initially not covered in 07/2020 and we tried to change to Reclast but she was finally able to start 04/2021  No jaw/hip/thigh pain.  + h/o vitamin D insufficiency. Reviewed available vit D levels: 11/29/2020: Vitamin D 31.7 Lab Results  Component Value Date   VD25OH 24.5 (L) 02/11/2020   Pt is on: - calcium 1200 mg + 1000 units daily - vitamin D 2000 units - started 02/2020.  No weight bearing exercises.  She walks for exercise - 1 mi a day - limited because of  back and knee/hip pain.  She does not take high vitamin A doses.  Menopause was at 70 y/o.   ? FH of osteoporosis. Mother died at 31 y/o from a heart attack.  No h/o hyper/hypocalcemia or hyperparathyroidism. No h/o kidney stones. 11/29/2020: Corrected calcium: 9.29 Lab Results  Component Value Date   PTH 28 02/11/2020   PTH Comment 02/11/2020   CALCIUM 9.0 02/11/2020   CALCIUM 9.5 09/17/2018   CALCIUM 9.2 01/11/2018   CALCIUM 9.1 12/06/2017   CALCIUM 9.2 05/21/2017   CALCIUM 9.5 05/10/2017   CALCIUM 7.7 (L) 04/25/2017   CALCIUM 9.3 04/16/2017   CALCIUM 8.9 02/22/2017   CALCIUM 9.7 01/23/2015   No h/o thyrotoxicosis. Reviewed TSH recent levels:  11/29/2020: TSH 3.19 Lab Results  Component Value Date   TSH 3.48 09/17/2018   TSH 8.371 (H) 02/22/2017   TSH 3.36 06/02/2016   TSH 3.258 11/05/2010   No h/o CKD. Last BUN/Cr: 11/29/2020: 30/0.77, GFR 83 Lab Results  Component Value Date   BUN 24 02/11/2020   CREATININE 0.68 02/11/2020   ROS: + See HPI + Joint pains, + poor sleep  I reviewed pt's medications, allergies, PMH, social hx, family hx, and changes were documented in the history of present illness. Otherwise, unchanged from my initial visit note.  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  pylori infection    History of blood transfusion 1984   Hx of colonic polyps    Hypercholesteremia    Hyponatremia    Migraines    Vitamin D deficiency    Past Surgical History:  Procedure Laterality Date   ABDOMINAL HYSTERECTOMY     ANTERIOR AND POSTERIOR REPAIR N/A 04/24/2017   Procedure: ANTERIOR (CYSTOCELE) AND POSTERIOR (RECTOCELE)  REPAIR;  Surgeon: Patton Salles, MD;  Location: [facility];  Service: Gynecology;  Laterality: N/A;   CESAREAN SECTION  1984   COLONOSCOPY  2012   COSMETIC SURGERY     Neck lift    CYSTOSCOPY N/A 04/24/2017   Procedure: CYSTOSCOPY;  Surgeon: Patton Salles, MD;  Location: [facility];  Service: Gynecology;  Laterality: N/A;   HEMORRHOID SURGERY  1984   LAPAROSCOPIC LYSIS OF ADHESIONS N/A 04/24/2017   Procedure: EXTENSIVE LAPAROSCOPIC LYSIS OF ADHESIONS;  Surgeon: Patton Salles, MD;  Location: [facility];  Service: Gynecology;  Laterality: N/A;   LAPAROSCOPIC VAGINAL HYSTERECTOMY WITH SALPINGO OOPHORECTOMY Bilateral 04/24/2017   Procedure: LAPAROSCOPIC ASSISTED VAGINAL HYSTERECTOMY WITH SALPINGO OOPHORECTOMY and pelvic washings;  Surgeon: Patton Salles, MD;  Location: [facility];  Service: Gynecology;  Laterality: Bilateral;  3 hours   RECTAL PROLAPSE REPAIR     Dr. Kendrick Ranch   UMBILICAL HERNIA REPAIR     had surgery as a child.   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     docusate sodium (COLACE) 100 MG capsule Take 100 mg by mouth at  bedtime.      OnabotulinumtoxinA (BOTOX IJ)      polyethylene glycol (MIRALAX / GLYCOLAX) packet Take 17 g by mouth at bedtime.     promethazine (PHENERGAN) 25 MG tablet Take 1 to 2 tablets every 6 hours as needed for nausea. (Patient not taking: Reported on 12/01/2020) 30 tablet 5   Simethicone (PHAZYME MAXIMUM STRENGTH) 250 MG CAPS Take 250 mg by mouth daily as needed (gas). (Patient not taking: Reported on 12/01/2020)     traMADol (ULTRAM) 50 MG tablet Take 1 tablet by mouth 4 (four) times daily as needed.     UBRELVY 100 MG TABS TAKE ONE TABLET AS NEEDED- MAY REPEAT 1 TABLET IN 2 HOURS. MAX OF 2/24 HOURS. 10 tablet  3   XIIDRA 5 % SOLN Place 1 drop into both eyes 2 (two) times daily.      zonisamide (ZONEGRAN) 100 MG capsule Take 2 cap daily 60 capsule 4   zonisamide (ZONEGRAN) 50 MG capsule Take 2 capsules (100 mg total) by mouth daily. (Patient not taking: Reported on 12/01/2020) 60 capsule 5   No current facility-administered medications on file prior to visit.   Allergies  Allergen Reactions   Aspirin Other (See Comments)    Hx of bleeding stomach ulcer    Crestor [Rosuvastatin Calcium] Itching   Inderal [Propranolol] Other (See Comments)    Chest tightness   Macrobid [Nitrofurantoin Monohyd Macro] Diarrhea   Other     Other reaction(s): Other (See Comments) Cholesterol meds-multiple reactions to multiple meds   Relpax [Eletriptan Hydrobromide] Other (See Comments)    Chest Tightness    Salicylates Other (See Comments)    Told not to take due to hx of GI ulcer   Topamax [Topiramate] Other (See Comments)    Chest tightness   Family History  Problem Relation Age of Onset   Migraines Mother    Heart attack Mother    Migraines Sister    Cancer Father        Prostate   PE: LMP 08/07/2001 (Approximate)  Wt Readings from Last 3 Encounters:  12/01/20 151 lb 6.4 oz (68.7 kg)  05/25/20 142 lb (64.4 kg)  05/24/20 143 lb 6.4 oz (65 kg)   Constitutional: Normal weight, in NAD.  No kyphosis. Eyes: PERRLA, EOMI, no exophthalmos ENT: moist mucous membranes, no thyromegaly, no cervical lymphadenopathy Cardiovascular: Normal rate at the end of the visit, RR, No MRG Respiratory: CTA B Gastrointestinal: abdomen soft, NT, ND, BS+ Musculoskeletal: no deformities, strength intact in all 4 Skin: moist, warm, no rashes Neurological: no tremor with outstretched hands, DTR normal in all 4  Assessment: 1. Osteoporosis  2.  Vitamin D insufficiency  Plan: 1. Osteoporosis -Likely postmenopausal/age-related, she also has family history of osteoporosis and also history of steroid injections in joints.  In the past, she tried to switch to gel injections and avoid steroids but these were not helping much. -Reviewed latest bone density results from 2021 and the T-scores were in the osteoporotic range.  We discussed that this increases her risk of fractures. -At last visit, after discussion about different medications with benefits and side effects, we decided to start Prolia.  She had problems obtaining it last year, but she was able to start it in 04/2021. -She tolerates Prolia well, without jaw pain or worse thigh/hip pain -We discussed about continuing this even 10 years or more if needed based on the recent studies -Next injection will be due in 10/2021 -At last visit, I recommended to make sure she gets 1000 to 1200 mg of calcium daily preferentially from the diet.  She was taking at 1200 mg calcium supplement and I advised her to reduce this. -At last visit and again today we discussed about fall precautions -She did not start weightbearing exercises as recommended at last visit due to her joint pains -At last visit, I also recommended a low acid diet, which she is trying to follow -At this visit, we reviewed her calcium and kidney function from 11/2020: Normal -For now, we will continue with Prolia as discussed and plan to have a new DXA scan next year. -I will see her back in  a year  2.  Vitamin D insufficiency -Continues on 3000 units vitamin D  daily started after the vitamin D returned low in 072021 -Latest vitamin D level from 11/2020 was normal -We will repeat a level today  Carlus Pavlov, MD PhD [facility] Endocrinology

## 2021-05-26 DIAGNOSIS — H2513 Age-related nuclear cataract, bilateral: Secondary | ICD-10-CM | POA: Diagnosis not present

## 2021-05-26 DIAGNOSIS — H5213 Myopia, bilateral: Secondary | ICD-10-CM | POA: Diagnosis not present

## 2021-05-26 DIAGNOSIS — H04123 Dry eye syndrome of bilateral lacrimal glands: Secondary | ICD-10-CM | POA: Diagnosis not present

## 2021-05-30 ENCOUNTER — Ambulatory Visit: Payer: Medicare PPO | Admitting: Obstetrics and Gynecology

## 2021-05-30 NOTE — Progress Notes (Deleted)
GYNECOLOGY  VISIT   HPI: 70 y.o.   Widowed  Caucasian  female   G2P1002 with Patient's last menstrual period was 08/07/2001 (approximate).   here for breast and pelvic exam.   GYNECOLOGIC HISTORY: Patient's last menstrual period was 08/07/2001 (approximate). Contraception: Hyst Menopausal hormone therapy:  ***None Last mammogram: 01-10-21  3D/Neg/BiRads1 Last pap smear:  05-20-18 Neg, 01-17-17 Neg, 07-17-16 LGSIL:Neg HR HPV        OB History     Gravida  2   Para  2   Term  1   Preterm      AB      Living  2      SAB      IAB      Ectopic      Multiple      Live Births  2              Patient Active Problem List   Diagnosis Date Noted   Lumbar spondylosis 05/19/2019   Pain in right knee 02/24/2019   Low back pain 12/31/2018   Pain in joint of left shoulder 08/12/2018   Pain in joint of right shoulder 08/12/2018   Disorder of bladder 06/20/2018   Pain in left knee 06/05/2018   Stiffness of left knee 06/05/2018   Status post laparoscopy-assisted vaginal hysterectomy 04/24/2017   Dry eye syndrome 03/17/2016   Headache 03/17/2016   Hyperlipidemia 03/17/2016   Lack of bladder control 03/17/2016   Migraines 03/17/2016   Keratitis sicca, 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 pylori infection    History of blood transfusion 1984   Hx of colonic polyps    Hypercholesteremia    Hyponatremia    Migraines    Vitamin D deficiency     Past Surgical History:  Procedure Laterality Date   ABDOMINAL HYSTERECTOMY     ANTERIOR AND POSTERIOR REPAIR N/A 04/24/2017   Procedure: ANTERIOR (CYSTOCELE) AND POSTERIOR (RECTOCELE)  REPAIR;  Surgeon: Patton Salles, MD;  Location: [facility];  Service: Gynecology;  Laterality: N/A;   CESAREAN  SECTION  1984   COLONOSCOPY  2012   COSMETIC SURGERY     Neck lift   CYSTOSCOPY N/A 04/24/2017   Procedure: CYSTOSCOPY;  Surgeon: Patton Salles, MD;  Location: [facility];  Service: Gynecology;  Laterality: N/A;   HEMORRHOID SURGERY  1984   LAPAROSCOPIC LYSIS OF ADHESIONS N/A 04/24/2017   Procedure: EXTENSIVE LAPAROSCOPIC LYSIS OF ADHESIONS;  Surgeon: Patton Salles, MD;  Location: [facility];  Service: Gynecology;  Laterality: N/A;   LAPAROSCOPIC VAGINAL HYSTERECTOMY WITH SALPINGO OOPHORECTOMY Bilateral 04/24/2017   Procedure: LAPAROSCOPIC ASSISTED VAGINAL HYSTERECTOMY WITH SALPINGO OOPHORECTOMY and pelvic washings;  Surgeon: Patton Salles, MD;  Location: [facility];  Service: Gynecology;  Laterality: Bilateral;  3 hours   RECTAL PROLAPSE REPAIR     Dr. Kendrick Ranch   UMBILICAL HERNIA REPAIR     had surgery as a child.    Current Outpatient Medications  Medication Sig Dispense Refill   Calcium 600-200 MG-UNIT tablet Take 1 tablet by mouth daily.     docusate sodium (COLACE) 100 MG capsule Take 100 mg by mouth at bedtime.  OnabotulinumtoxinA (BOTOX IJ)      polyethylene glycol (MIRALAX / GLYCOLAX) packet Take 17 g by mouth at bedtime.     promethazine (PHENERGAN) 25 MG tablet Take 1 to 2 tablets every 6 hours as needed for nausea. (Patient not taking: Reported on 12/01/2020) 30 tablet 5   Simethicone (PHAZYME MAXIMUM STRENGTH) 250 MG CAPS Take 250 mg by mouth daily as needed (gas). (Patient not taking: Reported on 12/01/2020)     traMADol (ULTRAM) 50 MG tablet Take 1 tablet by mouth 4 (four) times daily as needed.     UBRELVY 100 MG TABS TAKE ONE TABLET AS NEEDED- MAY REPEAT 1 TABLET IN 2 HOURS. MAX OF 2/24 HOURS. 10 tablet 3   XIIDRA 5 % SOLN Place 1 drop into both eyes 2 (two) times daily.      zonisamide (ZONEGRAN) 100 MG capsule Take 2 cap daily 60 capsule 4   zonisamide (ZONEGRAN) 50 MG capsule Take 2 capsules (100 mg total) by mouth daily. (Patient not taking:  Reported on 12/01/2020) 60 capsule 5   No current facility-administered medications for this visit.     ALLERGIES: Aspirin, Crestor [rosuvastatin calcium], Inderal [propranolol], Macrobid [nitrofurantoin monohyd macro], Other, Relpax [eletriptan hydrobromide], Salicylates, and Topamax [topiramate]  Family History  Problem Relation Age of Onset   Migraines Mother    Heart attack Mother    Migraines Sister    Cancer Father        Prostate    Social History   Socioeconomic History   Marital status: Widowed    Spouse name: Not on file   Number of children: 2   Years of education: Not on file   Highest education level: Not on file  Occupational History   Occupation: LPN  Tobacco Use   Smoking status: Never   Smokeless tobacco: Never  Vaping Use   Vaping Use: Never used  Substance and Sexual Activity   Alcohol use: No   Drug use: No   Sexual activity: Not Currently    Birth control/protection: Post-menopausal, Surgical    Comment: Hyst  Other Topics Concern   Not on file  Social History Narrative   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   Right handed   Town house   Social Determinants of Health   Financial Resource Strain: Not on file  Food Insecurity: Not on file  Transportation Needs: Not on file  Physical Activity: Not on file  Stress: Not on file  Social Connections: Not on file  Intimate Partner Violence: Not on file    Review of Systems  PHYSICAL EXAMINATION:    LMP 08/07/2001 (Approximate)     General appearance: alert, cooperative and appears stated age Head: Normocephalic, without obvious abnormality, atraumatic Neck: no adenopathy, supple, symmetrical, trachea midline and thyroid normal to inspection and palpation Lungs: clear to auscultation bilaterally Breasts: normal appearance, no masses or tenderness, No nipple retraction or dimpling, No nipple discharge or bleeding, No axillary or supraclavicular adenopathy Heart: regular rate and rhythm Abdomen:  soft, non-tender, no masses,  no organomegaly Extremities: extremities normal, atraumatic, no cyanosis or edema Skin: Skin color, texture, turgor normal. No rashes or lesions Lymph nodes: Cervical, supraclavicular, and axillary nodes normal. No abnormal inguinal nodes palpated Neurologic: Grossly normal  Pelvic: External genitalia:  no lesions              Urethra:  normal appearing urethra with no masses, tenderness or lesions  Bartholins and Skenes: normal                 Vagina: normal appearing vagina with normal color and discharge, no lesions              Cervix: no lesions                Bimanual Exam:  Uterus:  normal size, contour, position, consistency, mobility, non-tender              Adnexa: no mass, fullness, tenderness              Rectal exam: {yes no:[id number]}.  Confirms.              Anus:  normal sphincter tone, no lesions  Chaperone was present for exam:  ***  ASSESSMENT     PLAN     An After Visit Summary was printed and given to the patient.  ______ minutes face to face time of which over 50% was spent in counseling.

## 2021-06-23 ENCOUNTER — Telehealth: Payer: Self-pay

## 2021-06-23 NOTE — Telephone Encounter (Signed)
New message  I called the patient at  [phone] to contact [facility] for consent to Botox medication.

## 2021-06-28 ENCOUNTER — Other Ambulatory Visit: Payer: Self-pay

## 2021-06-28 DIAGNOSIS — G43709 Chronic migraine without aura, not intractable, without status migrainosus: Secondary | ICD-10-CM

## 2021-06-28 MED ORDER — BOTOX 200 UNITS IJ SOLR: INTRAMUSCULAR | 4 refills | Status: DC

## 2021-06-28 NOTE — Progress Notes (Signed)
Per pt pharmacy please send a new script for Botox.

## 2021-07-04 DIAGNOSIS — I10 Essential (primary) hypertension: Secondary | ICD-10-CM | POA: Diagnosis not present

## 2021-07-05 NOTE — Telephone Encounter (Signed)
F/u   I called [facility] at  [phone] was advised the patient has not given consent nor has a copayment of $ 100.00 posted to the account.   I went ahead and scheduled a target date of delivery between  12.6.22 to 12.9.22. [facility] is aware of the upcoming appt 07/22/21.  I called the patient at [phone] inquiring about consent and copayment with [facility].   The patient verbalized she is having a difficult time with Specialty Pharmacy calling her all hours of the day and on hold for long periods.    The patient verbalized paying at the office like she used to do in the past informed the patient that was not an option the patient verbalized that I was not listening to her.  I apologized to the patient and informed patient I will discuss her concerns with the Practice Administrator and will call back later today.   The patient apologize and verbalized understanding.

## 2021-07-07 ENCOUNTER — Other Ambulatory Visit: Payer: Self-pay | Admitting: Neurology

## 2021-07-12 NOTE — Telephone Encounter (Signed)
F/u  Botox medication arrived to the office on  12.6.22

## 2021-07-18 DIAGNOSIS — M5136 Other intervertebral disc degeneration, lumbar region: Secondary | ICD-10-CM | POA: Diagnosis not present

## 2021-07-18 DIAGNOSIS — Z5181 Encounter for therapeutic drug level monitoring: Secondary | ICD-10-CM | POA: Diagnosis not present

## 2021-07-18 DIAGNOSIS — Z79899 Other long term (current) drug therapy: Secondary | ICD-10-CM | POA: Diagnosis not present

## 2021-07-21 ENCOUNTER — Telehealth: Payer: Self-pay

## 2021-07-21 NOTE — Telephone Encounter (Signed)
New message   [name] Key: [id number] help? Call us at [phone] Outcome Additional Information Required Authorization already on file for this request. Authorization starting on 08/08/2019 and ending on 08/06/2022. Drug Ubrelvy 100MG  tablets Form Electronic PA Form

## 2021-07-22 ENCOUNTER — Ambulatory Visit: Payer: Medicare PPO | Admitting: Neurology

## 2021-07-22 ENCOUNTER — Other Ambulatory Visit: Payer: Self-pay

## 2021-07-22 ENCOUNTER — Telehealth: Payer: Self-pay | Admitting: Internal Medicine

## 2021-07-22 DIAGNOSIS — G43709 Chronic migraine without aura, not intractable, without status migrainosus: Secondary | ICD-10-CM

## 2021-07-22 MED ORDER — ONABOTULINUMTOXINA 100 UNITS IJ SOLR
200.0000 [IU] | Freq: Once | INTRAMUSCULAR | Status: AC
  Administered 2021-07-22: 155 [IU] via INTRAMUSCULAR

## 2021-07-22 NOTE — Telephone Encounter (Signed)
PT stopped by and asked if it would be a problem with taking Botox and Prolia on the same day. Please call her at [phone] to advise. If no answer please leave a message.

## 2021-07-22 NOTE — Telephone Encounter (Signed)
T, I would probably separate them by few days, but I am not aware of an interaction. Just to make sure there is no side effects from one of them. C

## 2021-07-22 NOTE — Progress Notes (Signed)
Botulinum Clinic   Procedure Note Botox  Attending: Dr. Cordarius Benning  Preoperative Diagnosis(es): Chronic migraine  Consent obtained from: The patient Benefits discussed included, but were not limited to decreased muscle tightness, increased joint range of motion, and decreased pain.  Risk discussed included, but were not limited pain and discomfort, bleeding, bruising, excessive weakness, venous thrombosis, muscle atrophy and dysphagia.  Anticipated outcomes of the procedure as well as he risks and benefits of the alternatives to the procedure, and the roles and tasks of the personnel to be involved, were discussed with the patient, and the patient consents to the procedure and agrees to proceed. A copy of the patient medication guide was given to the patient which explains the blackbox warning.  Patients identity and treatment sites confirmed Yes.  .  Details of Procedure: Skin was cleaned with alcohol. Prior to injection, the needle plunger was aspirated to make sure the needle was not within a blood vessel.  There was no blood retrieved on aspiration.    Following is a summary of the muscles injected  And the amount of Botulinum toxin used:  Dilution 200 units of Botox was reconstituted with 4 ml of preservative free normal saline. Time of reconstitution: At the time of the office visit (<30 minutes prior to injection)   Injections  155 total units of Botox was injected with a 30 gauge needle.  Injection Sites: L occipitalis: 15 units- 3 sites  R occiptalis: 15 units- 3 sites  L upper trapezius: 15 units- 3 sites R upper trapezius: 15 units- 3 sits          L paraspinal: 10 units- 2 sites R paraspinal: 10 units- 2 sites  Face L frontalis(2 injection sites):10 units   R frontalis(2 injection sites):10 units         L corrugator: 5 units   R corrugator: 5 units           Procerus: 5 units   L temporalis: 20 units R temporalis: 20 units   Agent:  200 units of botulinum Type A  (Onobotulinum Toxin type A) was reconstituted with 4 ml of preservative free normal saline.  Time of reconstitution: At the time of the office visit (<30 minutes prior to injection)     Total injected (Units): 155  Total wasted (Units): 5  Patient tolerated procedure well without complications.   Reinjection is anticipated in 3 months.   

## 2021-07-25 DIAGNOSIS — I10 Essential (primary) hypertension: Secondary | ICD-10-CM | POA: Diagnosis not present

## 2021-07-25 DIAGNOSIS — Z1211 Encounter for screening for malignant neoplasm of colon: Secondary | ICD-10-CM | POA: Diagnosis not present

## 2021-07-25 NOTE — Telephone Encounter (Signed)
MyChart message sent to pt

## 2021-07-26 NOTE — Telephone Encounter (Signed)
F/u  [name] Key: [id number] help? Call us at [phone] Outcome Additional Information Required Authorization already on file for this request. Authorization starting on 08/08/2019 and ending on 08/06/2022. Drug Ubrelvy 100MG  tablets Form Electronic PA Form

## 2021-08-23 ENCOUNTER — Other Ambulatory Visit: Payer: Self-pay | Admitting: Neurology

## 2021-08-26 DIAGNOSIS — I10 Essential (primary) hypertension: Secondary | ICD-10-CM | POA: Diagnosis not present

## 2021-08-26 DIAGNOSIS — R195 Other fecal abnormalities: Secondary | ICD-10-CM | POA: Diagnosis not present

## 2021-09-12 ENCOUNTER — Telehealth: Payer: Self-pay

## 2021-09-12 NOTE — Telephone Encounter (Signed)
New message   Benefit Verification [id number] Submitted! For BV Basic submissions, please allow 2 business days for results.  For BV Full submissions, please allow 4 business days for results.

## 2021-09-12 NOTE — Telephone Encounter (Signed)
New message   Fax approval letter to [facility] at [phone]

## 2021-09-13 NOTE — Telephone Encounter (Signed)
F/u  BOTOX (onabotulinumtoxinA)  ACQUISITION - [place] List of Specialty Pharmacies may not include all available options. If preferred Specialty Pharmacy is not listed, check with payer.  _0  Buy and Bill [?]Buy and [place] Available _1  Specialty Pharmacy Required _2  Prescription Coverage Only - See Pharmacy Benefits Section _3  Payer will not release information Specialty Pharmacies: [place] [phone]   COVERAGE DETAILS - MAJOR MEDICAL BENEFITS The plan's renewal date is 08/07/2022. This plan runs on a calendar year basis. Once the medical individual out of pocket maximum has been met, the patient responsibility will be 0%. Under major medical benefits, the payer has a network of preferred specialty pharmacies. The option(s) shown above are preferred specialty pharmacies within the payer's network. Other specialty pharmacies may be available, however may not be one of the preferred pharmacies. Using a non-preferred pharmacy may result in differing benefits. Under major medical benefits, the payer was unable to confirm if one or more of your preferred specialty pharmacies are available. The specialty pharmacy options provided above were confirmed to be available per the payer. For additional payer coverage criteria, please reference the BOTOX Policies and Forms link available at [email or web address].

## 2021-09-20 DIAGNOSIS — M170 Bilateral primary osteoarthritis of knee: Secondary | ICD-10-CM | POA: Diagnosis not present

## 2021-09-26 ENCOUNTER — Other Ambulatory Visit: Payer: Self-pay

## 2021-09-26 ENCOUNTER — Telehealth: Payer: Self-pay | Admitting: Neurology

## 2021-09-26 ENCOUNTER — Telehealth: Payer: Self-pay

## 2021-09-26 ENCOUNTER — Ambulatory Visit (INDEPENDENT_AMBULATORY_CARE_PROVIDER_SITE_OTHER): Payer: Medicare PPO

## 2021-09-26 DIAGNOSIS — G43709 Chronic migraine without aura, not intractable, without status migrainosus: Secondary | ICD-10-CM | POA: Diagnosis not present

## 2021-09-26 DIAGNOSIS — R519 Headache, unspecified: Secondary | ICD-10-CM

## 2021-09-26 MED ORDER — RIZATRIPTAN BENZOATE 10 MG PO TBDP: 10.0000 mg | ORAL_TABLET | ORAL | 6 refills | Status: DC | PRN

## 2021-09-26 MED ORDER — PROMETHAZINE HCL 25 MG PO TABS: ORAL_TABLET | ORAL | 5 refills | Status: AC

## 2021-09-26 MED ORDER — DIPHENHYDRAMINE HCL 50 MG/ML IJ SOLN
25.0000 mg | Freq: Once | INTRAMUSCULAR | Status: AC
  Administered 2021-09-26: 25 mg via INTRAMUSCULAR

## 2021-09-26 MED ORDER — KETOROLAC TROMETHAMINE 60 MG/2ML IM SOLN
60.0000 mg | Freq: Once | INTRAMUSCULAR | Status: AC
  Administered 2021-09-26: 60 mg via INTRAMUSCULAR

## 2021-09-26 MED ORDER — METOCLOPRAMIDE HCL 5 MG/ML IJ SOLN
10.0000 mg | Freq: Once | INTRAMUSCULAR | Status: AC
  Administered 2021-09-26: 10 mg via INTRAMUSCULAR

## 2021-09-26 NOTE — Telephone Encounter (Signed)
OK to refill promethazine.  Also, place order for rizatriptan 10mg  at earliest onset of migraine.  May repeat after 2 hours.  Maximum 2 tablets in 24 hours

## 2021-09-26 NOTE — Telephone Encounter (Signed)
Per pt Urblevy not effective anymore. Pt also wanted to know if she could get her Promethazine refilled.  Please advise.

## 2021-09-26 NOTE — Telephone Encounter (Signed)
Patient called and stated she has had a migraine for a few days.  She wants to come in for a headache cocktail.

## 2021-09-26 NOTE — Telephone Encounter (Signed)
Pt called an advised that she can come for a headache cocktail she must have a driver and they have to come into the waiting room and to be here by 4pm

## 2021-09-27 DIAGNOSIS — M170 Bilateral primary osteoarthritis of knee: Secondary | ICD-10-CM | POA: Diagnosis not present

## 2021-09-28 ENCOUNTER — Telehealth: Payer: Self-pay

## 2021-09-28 DIAGNOSIS — Z86010 Personal history of colonic polyps: Secondary | ICD-10-CM | POA: Diagnosis not present

## 2021-09-28 DIAGNOSIS — R195 Other fecal abnormalities: Secondary | ICD-10-CM | POA: Diagnosis not present

## 2021-09-28 NOTE — Telephone Encounter (Signed)
New message   Fax [facility] approval letter over to [facility]   Fax  [phone]

## 2021-10-04 DIAGNOSIS — M13861 Other specified arthritis, right knee: Secondary | ICD-10-CM | POA: Diagnosis not present

## 2021-10-04 DIAGNOSIS — M170 Bilateral primary osteoarthritis of knee: Secondary | ICD-10-CM | POA: Diagnosis not present

## 2021-10-04 DIAGNOSIS — M13862 Other specified arthritis, left knee: Secondary | ICD-10-CM | POA: Diagnosis not present

## 2021-10-05 NOTE — Telephone Encounter (Signed)
Prolia VOB initiated via [email or web address] ? ?Last OV:  ?Next OV:  ?Last Prolia inj: 04/22/21 ?Next Prolia inj DUE: 10/21/21 ? ?

## 2021-10-05 NOTE — Telephone Encounter (Signed)
F/u   Checking on the status of Botox delivery advise to send over the approval letter to [phone]

## 2021-10-11 NOTE — Telephone Encounter (Signed)
Prior auth required for PROLIA ? ?PA PROCESS DETAILS: PA is required. PA can be initiated by calling [phone] or online at ?[email or web address] ? ?

## 2021-10-13 NOTE — Telephone Encounter (Signed)
Prior auth renewal initiated via [email or web address] ?KEY: [id number] - PA Case ID: [id number] ? ? ?

## 2021-10-18 NOTE — Telephone Encounter (Signed)
Pt ready for scheduling on or after 10/21/21 ? ?Out-of-pocket cost due at time of visit: $35 ? ?Primary: [employer] ?Prolia co-insurance: 0% ?Admin fee co-insurance: $35 ? ?Secondary: n/a ?Prolia co-insurance:  ?Admin fee co-insurance:  ? ?Deductible: does not apply ? ?Prior Auth: APPROVED ?PA# [id number] - PA Case ID: [id number] ?Valid: 10/12/20-08/06/22 ?  ? ?** This summary of benefits is an estimation of the patient's out-of-pocket cost. Exact cost may very based on individual plan coverage.  ? ?

## 2021-10-18 NOTE — Telephone Encounter (Addendum)
Prior auth approved ?Key: [id number] - PA Case ID: [id number] ? ? ? ? ?

## 2021-10-18 NOTE — Telephone Encounter (Signed)
Appt 10/28/21 ?

## 2021-10-21 ENCOUNTER — Ambulatory Visit: Payer: Medicare PPO | Admitting: Neurology

## 2021-10-21 ENCOUNTER — Ambulatory Visit: Payer: Medicare PPO

## 2021-10-21 ENCOUNTER — Other Ambulatory Visit: Payer: Self-pay

## 2021-10-21 DIAGNOSIS — G43709 Chronic migraine without aura, not intractable, without status migrainosus: Secondary | ICD-10-CM

## 2021-10-21 MED ORDER — ONABOTULINUMTOXINA 100 UNITS IJ SOLR
200.0000 [IU] | Freq: Once | INTRAMUSCULAR | Status: AC
  Administered 2021-10-21: 155 [IU] via INTRAMUSCULAR

## 2021-10-21 NOTE — Progress Notes (Signed)
Botulinum Clinic  ° °Procedure Note Botox ° °Attending: Dr. Keylen Uzelac ° °Preoperative Diagnosis(es): Chronic migraine ° °Consent obtained from: The patient °Benefits discussed included, but were not limited to decreased muscle tightness, increased joint range of motion, and decreased pain.  Risk discussed included, but were not limited pain and discomfort, bleeding, bruising, excessive weakness, venous thrombosis, muscle atrophy and dysphagia.  Anticipated outcomes of the procedure as well as he risks and benefits of the alternatives to the procedure, and the roles and tasks of the personnel to be involved, were discussed with the patient, and the patient consents to the procedure and agrees to proceed. A copy of the patient medication guide was given to the patient which explains the blackbox warning. ° °Patients identity and treatment sites confirmed Yes.  . ° °Details of Procedure: °Skin was cleaned with alcohol. Prior to injection, the needle plunger was aspirated to make sure the needle was not within a blood vessel.  There was no blood retrieved on aspiration.   ° °Following is a summary of the muscles injected  And the amount of Botulinum toxin used: ° °Dilution °200 units of Botox was reconstituted with 4 ml of preservative free normal saline. °Time of reconstitution: At the time of the office visit (<30 minutes prior to injection)  ° °Injections  °155 total units of Botox was injected with a 30 gauge needle. ° °Injection Sites: °L occipitalis: 15 units- 3 sites  °R occiptalis: 15 units- 3 sites ° °L upper trapezius: 15 units- 3 sites °R upper trapezius: 15 units- 3 sits          °L paraspinal: 10 units- 2 sites °R paraspinal: 10 units- 2 sites ° °Face °L frontalis(2 injection sites):10 units   °R frontalis(2 injection sites):10 units         °L corrugator: 5 units   °R corrugator: 5 units           °Procerus: 5 units   °L temporalis: 20 units °R temporalis: 20 units  ° °Agent:  °200 units of botulinum Type  A (Onobotulinum Toxin type A) was reconstituted with 4 ml of preservative free normal saline.  °Time of reconstitution: At the time of the office visit (<30 minutes prior to injection)  ° ° ° Total injected (Units):  155 ° Total wasted (Units):  45 ° °Patient tolerated procedure well without complications.   °Reinjection is anticipated in 3 months. ° ° °

## 2021-10-28 ENCOUNTER — Other Ambulatory Visit: Payer: Self-pay

## 2021-10-28 ENCOUNTER — Ambulatory Visit: Payer: Medicare PPO

## 2021-10-28 DIAGNOSIS — M810 Age-related osteoporosis without current pathological fracture: Secondary | ICD-10-CM

## 2021-10-28 MED ORDER — DENOSUMAB 60 MG/ML ~~LOC~~ SOSY
60.0000 mg | PREFILLED_SYRINGE | Freq: Once | SUBCUTANEOUS | Status: AC
  Administered 2021-10-28: 60 mg via SUBCUTANEOUS

## 2021-10-28 NOTE — Telephone Encounter (Signed)
Last Prolia inj 10/28/21 ?Next Prolia inj due 05/01/22 ? ?

## 2021-10-28 NOTE — Progress Notes (Signed)
Patient verbally confirmed name, date of birth, and correct medication to be administered. Prolia injection administered and pt tolerated well.  

## 2021-11-02 DIAGNOSIS — D122 Benign neoplasm of ascending colon: Secondary | ICD-10-CM | POA: Diagnosis not present

## 2021-11-02 DIAGNOSIS — Z1211 Encounter for screening for malignant neoplasm of colon: Secondary | ICD-10-CM | POA: Diagnosis not present

## 2021-11-02 DIAGNOSIS — K5730 Diverticulosis of large intestine without perforation or abscess without bleeding: Secondary | ICD-10-CM | POA: Diagnosis not present

## 2021-11-02 DIAGNOSIS — D124 Benign neoplasm of descending colon: Secondary | ICD-10-CM | POA: Diagnosis not present

## 2021-11-02 DIAGNOSIS — K6289 Other specified diseases of anus and rectum: Secondary | ICD-10-CM | POA: Diagnosis not present

## 2021-11-02 DIAGNOSIS — D123 Benign neoplasm of transverse colon: Secondary | ICD-10-CM | POA: Diagnosis not present

## 2021-11-02 DIAGNOSIS — K644 Residual hemorrhoidal skin tags: Secondary | ICD-10-CM | POA: Diagnosis not present

## 2021-11-02 DIAGNOSIS — K648 Other hemorrhoids: Secondary | ICD-10-CM | POA: Diagnosis not present

## 2021-11-04 DIAGNOSIS — D122 Benign neoplasm of ascending colon: Secondary | ICD-10-CM | POA: Diagnosis not present

## 2021-11-04 DIAGNOSIS — D124 Benign neoplasm of descending colon: Secondary | ICD-10-CM | POA: Diagnosis not present

## 2021-11-04 DIAGNOSIS — D123 Benign neoplasm of transverse colon: Secondary | ICD-10-CM | POA: Diagnosis not present

## 2021-11-09 DIAGNOSIS — Z79891 Long term (current) use of opiate analgesic: Secondary | ICD-10-CM | POA: Diagnosis not present

## 2021-11-09 DIAGNOSIS — M5136 Other intervertebral disc degeneration, lumbar region: Secondary | ICD-10-CM | POA: Diagnosis not present

## 2021-11-09 DIAGNOSIS — M47896 Other spondylosis, lumbar region: Secondary | ICD-10-CM | POA: Diagnosis not present

## 2021-11-12 IMAGING — MG MM DIGITAL SCREENING BILAT W/ TOMO AND CAD
8 series · 8 of 24 positions shown · non-contrast
Comparison: Previous exam(s).

CLINICAL DATA: Screening.

EXAM:
DIGITAL SCREENING BILATERAL MAMMOGRAM WITH TOMOSYNTHESIS AND CAD
TECHNIQUE: Bilateral screening digital craniocaudal and mediolateral oblique
mammograms were obtained. Bilateral screening digital breast
tomosynthesis was performed. The images were evaluated with
computer-aided detection.

[L CC synth-2D]
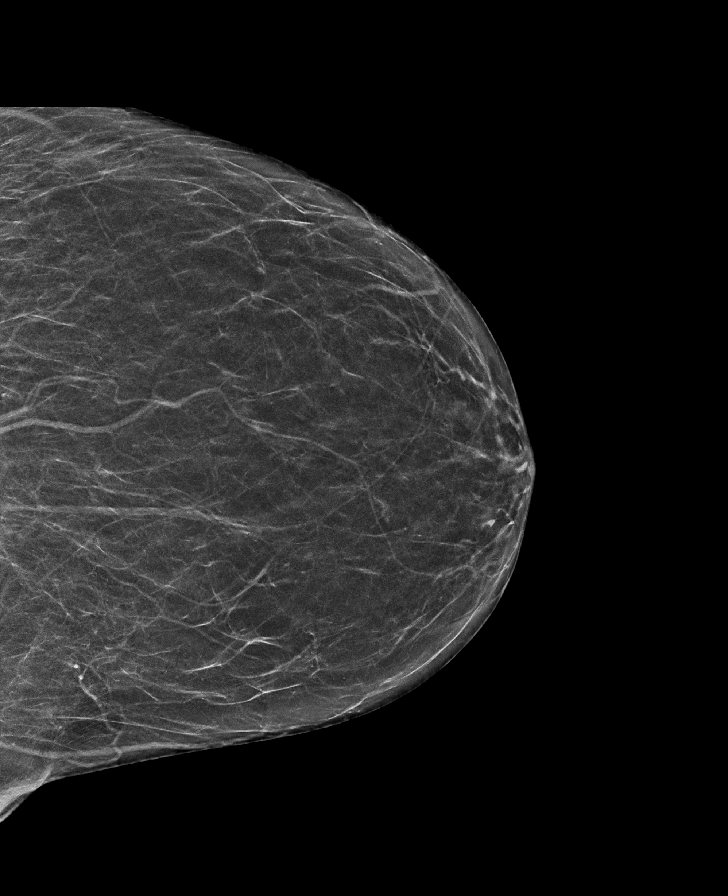

[R CC synth-2D]
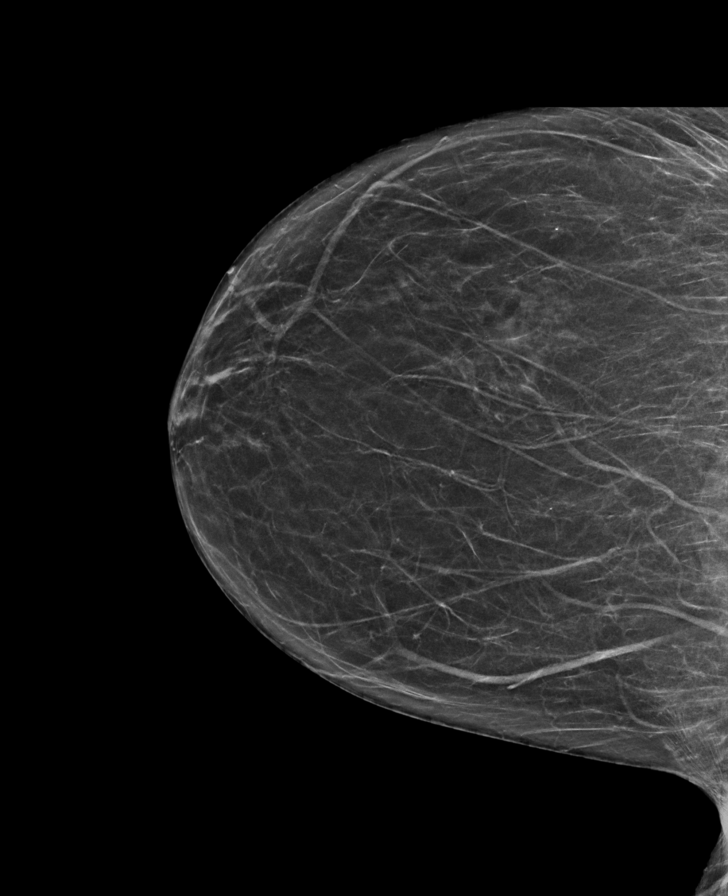

[L MLO synth-2D]
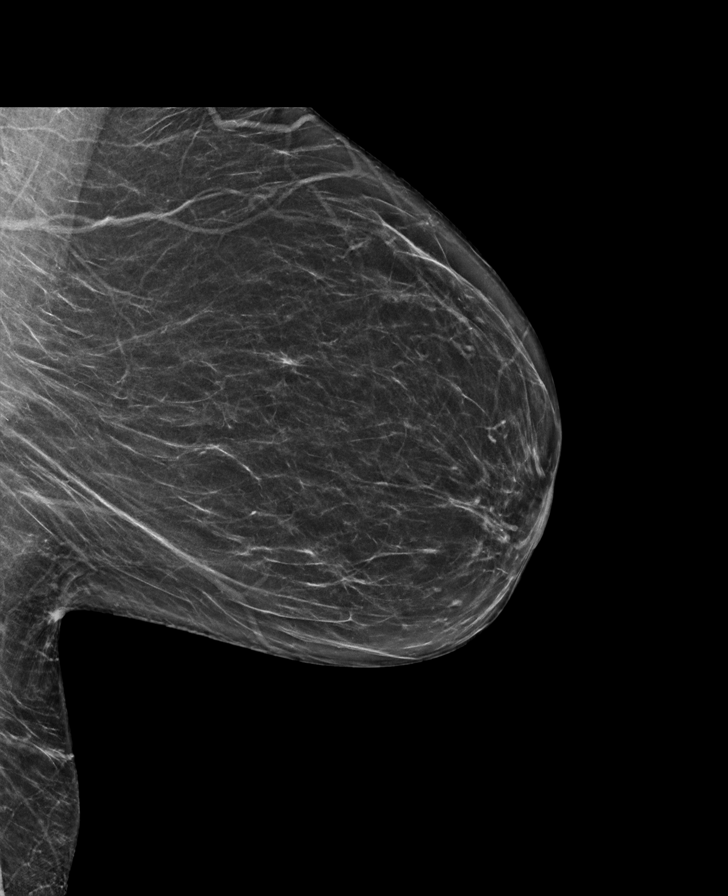

[R MLO synth-2D]
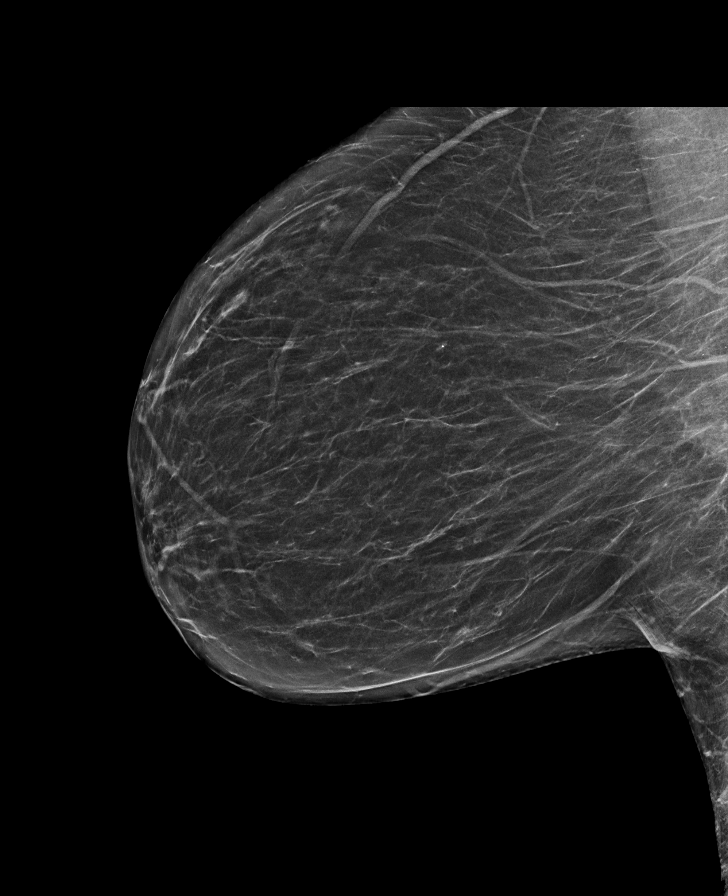

[R CC tomo · tomo slice 28/55.0]
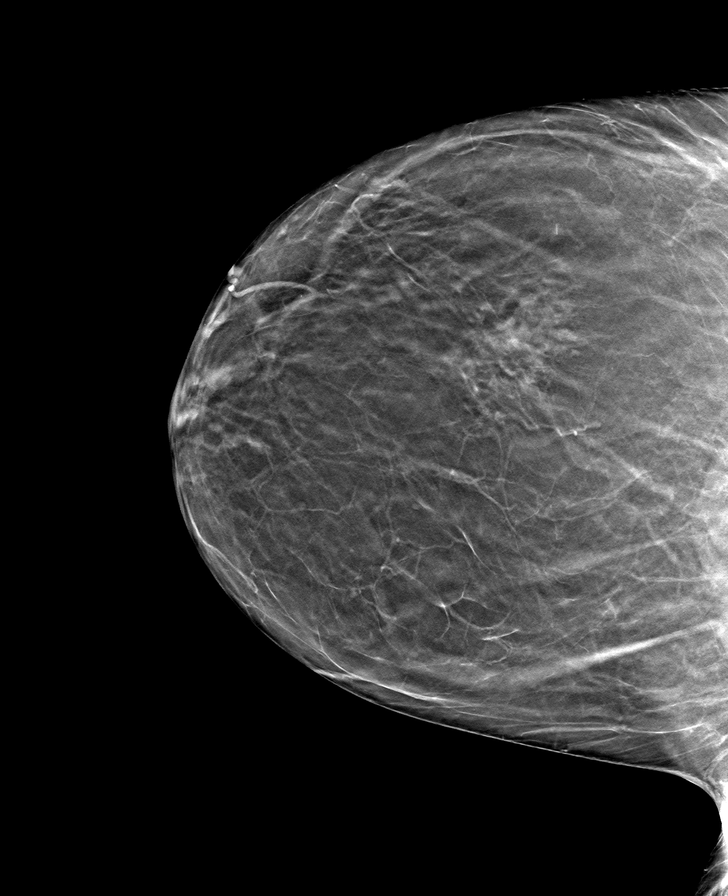

[L CC tomo · tomo slice 27/53.0]
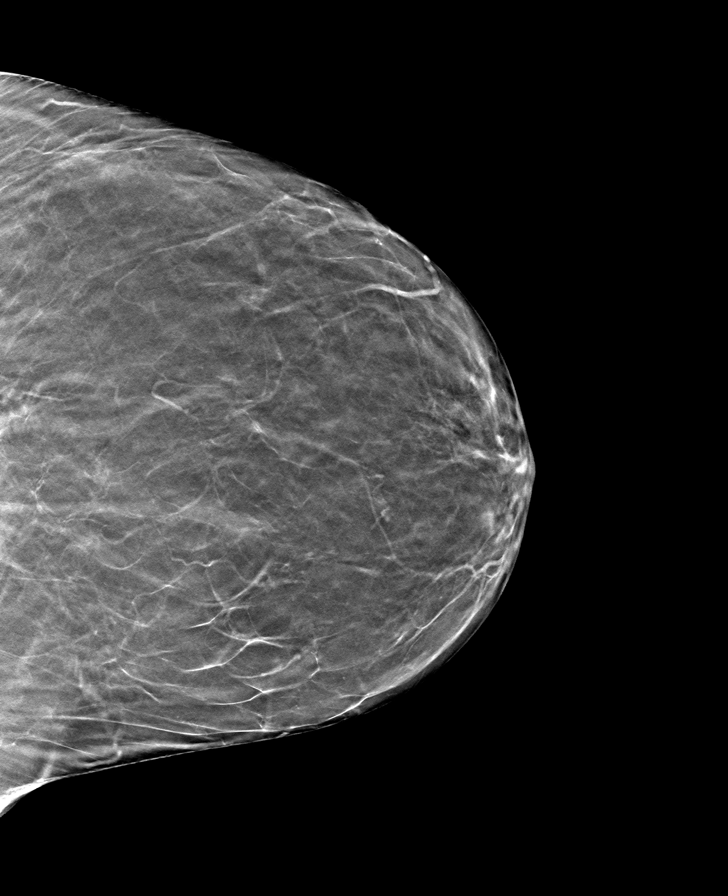

[R MLO tomo · tomo slice 31/61.0]
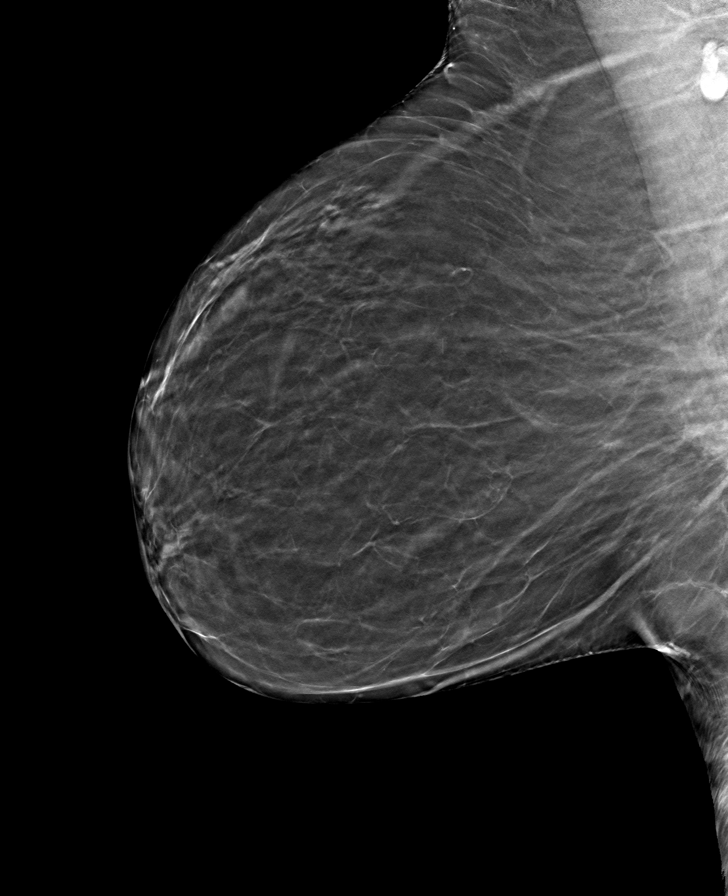

[L MLO tomo · tomo slice 31/62.0]
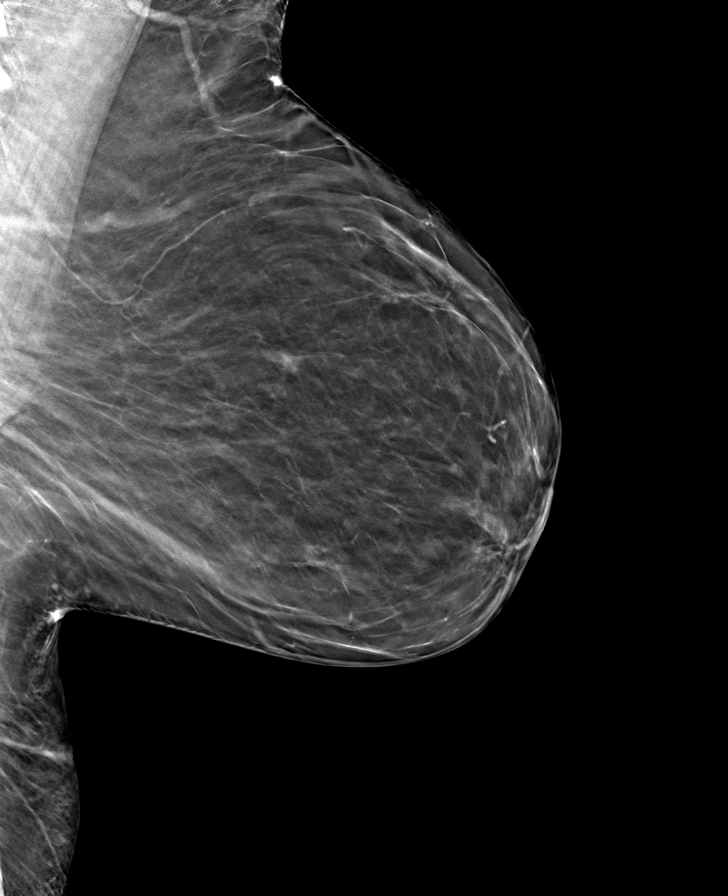

[8 of 24 positions shown; findings below may reference images not displayed]

ACR Breast Density Category b: There are scattered areas of
fibroglandular density.
FINDINGS: There are no findings suspicious for malignancy. The images were
evaluated with computer-aided detection.
IMPRESSION: No mammographic evidence of malignancy. A result letter of this
screening mammogram will be mailed directly to the patient.

RECOMMENDATION:
Screening mammogram in one year. (Code:WJ-I-BG6)

BI-RADS CATEGORY  1: Negative.

## 2021-12-19 DIAGNOSIS — M818 Other osteoporosis without current pathological fracture: Secondary | ICD-10-CM | POA: Diagnosis not present

## 2021-12-19 DIAGNOSIS — G43909 Migraine, unspecified, not intractable, without status migrainosus: Secondary | ICD-10-CM | POA: Diagnosis not present

## 2021-12-19 DIAGNOSIS — E7800 Pure hypercholesterolemia, unspecified: Secondary | ICD-10-CM | POA: Diagnosis not present

## 2021-12-19 DIAGNOSIS — Z23 Encounter for immunization: Secondary | ICD-10-CM | POA: Diagnosis not present

## 2021-12-19 DIAGNOSIS — R7301 Impaired fasting glucose: Secondary | ICD-10-CM | POA: Diagnosis not present

## 2021-12-19 DIAGNOSIS — D126 Benign neoplasm of colon, unspecified: Secondary | ICD-10-CM | POA: Diagnosis not present

## 2021-12-19 DIAGNOSIS — Z0000 Encounter for general adult medical examination without abnormal findings: Secondary | ICD-10-CM | POA: Diagnosis not present

## 2021-12-19 DIAGNOSIS — I10 Essential (primary) hypertension: Secondary | ICD-10-CM | POA: Diagnosis not present

## 2021-12-19 DIAGNOSIS — E559 Vitamin D deficiency, unspecified: Secondary | ICD-10-CM | POA: Diagnosis not present

## 2022-01-06 NOTE — Progress Notes (Signed)
Spoke to [place] at [facility], Set up delivery for 01/17/22.  Hours of Operation given 7:30-4:00 pm.

## 2022-01-12 ENCOUNTER — Other Ambulatory Visit: Payer: Self-pay | Admitting: Neurology

## 2022-01-20 ENCOUNTER — Ambulatory Visit: Payer: Medicare PPO | Admitting: Neurology

## 2022-01-20 DIAGNOSIS — G43709 Chronic migraine without aura, not intractable, without status migrainosus: Secondary | ICD-10-CM | POA: Diagnosis not present

## 2022-01-20 MED ORDER — ONABOTULINUMTOXINA 100 UNITS IJ SOLR
200.0000 [IU] | Freq: Once | INTRAMUSCULAR | Status: AC
  Administered 2022-01-20: 155 [IU] via INTRAMUSCULAR

## 2022-01-20 NOTE — Progress Notes (Signed)
Botulinum Clinic  ° °Procedure Note Botox ° °Attending: Dr. Hammond Obeirne ° °Preoperative Diagnosis(es): Chronic migraine ° °Consent obtained from: The patient °Benefits discussed included, but were not limited to decreased muscle tightness, increased joint range of motion, and decreased pain.  Risk discussed included, but were not limited pain and discomfort, bleeding, bruising, excessive weakness, venous thrombosis, muscle atrophy and dysphagia.  Anticipated outcomes of the procedure as well as he risks and benefits of the alternatives to the procedure, and the roles and tasks of the personnel to be involved, were discussed with the patient, and the patient consents to the procedure and agrees to proceed. A copy of the patient medication guide was given to the patient which explains the blackbox warning. ° °Patients identity and treatment sites confirmed Yes.  . ° °Details of Procedure: °Skin was cleaned with alcohol. Prior to injection, the needle plunger was aspirated to make sure the needle was not within a blood vessel.  There was no blood retrieved on aspiration.   ° °Following is a summary of the muscles injected  And the amount of Botulinum toxin used: ° °Dilution °200 units of Botox was reconstituted with 4 ml of preservative free normal saline. °Time of reconstitution: At the time of the office visit (<30 minutes prior to injection)  ° °Injections  °155 total units of Botox was injected with a 30 gauge needle. ° °Injection Sites: °L occipitalis: 15 units- 3 sites  °R occiptalis: 15 units- 3 sites ° °L upper trapezius: 15 units- 3 sites °R upper trapezius: 15 units- 3 sits          °L paraspinal: 10 units- 2 sites °R paraspinal: 10 units- 2 sites ° °Face °L frontalis(2 injection sites):10 units   °R frontalis(2 injection sites):10 units         °L corrugator: 5 units   °R corrugator: 5 units           °Procerus: 5 units   °L temporalis: 20 units °R temporalis: 20 units  ° °Agent:  °200 units of botulinum Type  A (Onobotulinum Toxin type A) was reconstituted with 4 ml of preservative free normal saline.  °Time of reconstitution: At the time of the office visit (<30 minutes prior to injection)  ° ° ° Total injected (Units):  155 ° Total wasted (Units):  45 ° °Patient tolerated procedure well without complications.   °Reinjection is anticipated in 3 months. ° ° °

## 2022-02-28 DIAGNOSIS — I10 Essential (primary) hypertension: Secondary | ICD-10-CM | POA: Diagnosis not present

## 2022-03-07 NOTE — Telephone Encounter (Signed)
Prolia VOB initiated via [email or web address]  Last Prolia inj 10/28/21 Next Prolia inj due 05/01/22  Prior Auth: APPROVED PA# [id number] - PA Case ID: [id number] Valid: 10/12/20-08/06/22

## 2022-03-09 DIAGNOSIS — M5136 Other intervertebral disc degeneration, lumbar region: Secondary | ICD-10-CM | POA: Diagnosis not present

## 2022-03-09 DIAGNOSIS — M48061 Spinal stenosis, lumbar region without neurogenic claudication: Secondary | ICD-10-CM | POA: Diagnosis not present

## 2022-03-10 ENCOUNTER — Ambulatory Visit: Payer: Medicare PPO | Admitting: Neurology

## 2022-03-16 NOTE — Telephone Encounter (Signed)
Prior auth required for PROLIA ? ?PA PROCESS DETAILS: PA is required. PA can be initiated by calling [phone] or online at ?[email or web address] ? ?

## 2022-03-21 NOTE — Telephone Encounter (Signed)
Pt ready for scheduling on or after 05/01/22   Out-of-pocket cost due at time of visit: $35   Primary: [employer] Prolia co-insurance: 0% Admin fee co-insurance: $35   Secondary: n/a Prolia co-insurance:  Admin fee co-insurance:    Deductible: does not apply   Prior Auth: APPROVED PA# [id number] - PA Case ID: [id number] Valid: 10/12/20-08/06/22     ** This summary of benefits is an estimation of the patient's out-of-pocket cost. Exact cost may very based on individual plan coverage.

## 2022-04-21 ENCOUNTER — Ambulatory Visit: Payer: Medicare PPO | Admitting: Neurology

## 2022-04-21 DIAGNOSIS — G43709 Chronic migraine without aura, not intractable, without status migrainosus: Secondary | ICD-10-CM

## 2022-04-21 MED ORDER — ONABOTULINUMTOXINA 100 UNITS IJ SOLR
200.0000 [IU] | Freq: Once | INTRAMUSCULAR | Status: AC
  Administered 2022-04-21: 155 [IU] via INTRAMUSCULAR

## 2022-04-21 NOTE — Progress Notes (Signed)
Botulinum Clinic  ° °Procedure Note Botox ° °Attending: Dr. Iesha Summerhill ° °Preoperative Diagnosis(es): Chronic migraine ° °Consent obtained from: The patient °Benefits discussed included, but were not limited to decreased muscle tightness, increased joint range of motion, and decreased pain.  Risk discussed included, but were not limited pain and discomfort, bleeding, bruising, excessive weakness, venous thrombosis, muscle atrophy and dysphagia.  Anticipated outcomes of the procedure as well as he risks and benefits of the alternatives to the procedure, and the roles and tasks of the personnel to be involved, were discussed with the patient, and the patient consents to the procedure and agrees to proceed. A copy of the patient medication guide was given to the patient which explains the blackbox warning. ° °Patients identity and treatment sites confirmed Yes.  . ° °Details of Procedure: °Skin was cleaned with alcohol. Prior to injection, the needle plunger was aspirated to make sure the needle was not within a blood vessel.  There was no blood retrieved on aspiration.   ° °Following is a summary of the muscles injected  And the amount of Botulinum toxin used: ° °Dilution °200 units of Botox was reconstituted with 4 ml of preservative free normal saline. °Time of reconstitution: At the time of the office visit (<30 minutes prior to injection)  ° °Injections  °155 total units of Botox was injected with a 30 gauge needle. ° °Injection Sites: °L occipitalis: 15 units- 3 sites  °R occiptalis: 15 units- 3 sites ° °L upper trapezius: 15 units- 3 sites °R upper trapezius: 15 units- 3 sits          °L paraspinal: 10 units- 2 sites °R paraspinal: 10 units- 2 sites ° °Face °L frontalis(2 injection sites):10 units   °R frontalis(2 injection sites):10 units         °L corrugator: 5 units   °R corrugator: 5 units           °Procerus: 5 units   °L temporalis: 20 units °R temporalis: 20 units  ° °Agent:  °200 units of botulinum Type  A (Onobotulinum Toxin type A) was reconstituted with 4 ml of preservative free normal saline.  °Time of reconstitution: At the time of the office visit (<30 minutes prior to injection)  ° ° ° Total injected (Units):  155 ° Total wasted (Units):  45 ° °Patient tolerated procedure well without complications.   °Reinjection is anticipated in 3 months. ° ° °

## 2022-05-02 ENCOUNTER — Ambulatory Visit: Payer: Medicare PPO

## 2022-05-02 DIAGNOSIS — M810 Age-related osteoporosis without current pathological fracture: Secondary | ICD-10-CM

## 2022-05-02 MED ORDER — DENOSUMAB 60 MG/ML ~~LOC~~ SOSY
60.0000 mg | PREFILLED_SYRINGE | Freq: Once | SUBCUTANEOUS | Status: AC
  Administered 2022-05-02: 60 mg via SUBCUTANEOUS

## 2022-05-02 NOTE — Progress Notes (Signed)
Patient seen today for Prolia injection.  60mg /ml given in right arm subQ.  Patient tolerated well.  Follow up 6 months or as advised by provider.   Patient verified name, DOB and provided verbal consent prior to administration.   Patient also advised at checkout that her f/u appt is due as she has not been seen since 2021.

## 2022-05-03 ENCOUNTER — Telehealth: Payer: Self-pay

## 2022-05-03 NOTE — Chronic Care Management (AMB) (Signed)
  Care Coordination  Outreach Note  05/03/2022 Name: [name] MRN: [id number] DOB: February 08, 1951   Care Coordination Outreach Attempts: An unsuccessful telephone outreach was attempted today to offer the patient information about available care coordination services as a benefit of their health plan.   Follow Up Plan:  Additional outreach attempts will be made to offer the patient care coordination information and services.   Encounter Outcome:  No Answer  Sig Noreene Larsson, [place], [place] [address] Direct Dial: [phone] [email or web address]

## 2022-05-10 NOTE — Chronic Care Management (AMB) (Signed)
  Care Coordination  Outreach Note  05/10/2022 Name: [name] MRN: [id number] DOB: 19-Jan-1951   Care Coordination Outreach Attempts: A second unsuccessful outreach was attempted today to offer the patient with information about available care coordination services as a benefit of their health plan.     Follow Up Plan:  Additional outreach attempts will be made to offer the patient care coordination information and services.   Encounter Outcome:  No Answer  Sig Noreene Larsson, [place], [place] [address] Direct Dial: [phone] [email or web address]

## 2022-05-18 NOTE — Telephone Encounter (Signed)
Last Prolia inj 05/02/22 Next Prolia inj due 11/01/22 

## 2022-05-22 ENCOUNTER — Other Ambulatory Visit: Payer: Self-pay | Admitting: Obstetrics and Gynecology

## 2022-05-22 DIAGNOSIS — Z1231 Encounter for screening mammogram for malignant neoplasm of breast: Secondary | ICD-10-CM

## 2022-05-31 ENCOUNTER — Telehealth: Payer: Self-pay | Admitting: *Deleted

## 2022-05-31 DIAGNOSIS — H5213 Myopia, bilateral: Secondary | ICD-10-CM | POA: Diagnosis not present

## 2022-05-31 DIAGNOSIS — H2513 Age-related nuclear cataract, bilateral: Secondary | ICD-10-CM | POA: Diagnosis not present

## 2022-05-31 DIAGNOSIS — H04123 Dry eye syndrome of bilateral lacrimal glands: Secondary | ICD-10-CM | POA: Diagnosis not present

## 2022-05-31 NOTE — Patient Outreach (Signed)
  Care Coordination   05/31/2022 Name: [name] MRN: [id number] DOB: 1951-04-27   Care Coordination Outreach Attempts:  A second unsuccessful outreach was attempted today to offer the patient with information about available care coordination services as a benefit of their health plan.     Follow Up Plan:  Additional outreach attempts will be made to offer the patient care coordination information and services.   Encounter Outcome:  No Answer  Care Coordination Interventions Activated:  No   Care Coordination Interventions:  No, not indicated    Raina Mina, RN Care Management Coordinator [place] Office [phone]

## 2022-06-26 DIAGNOSIS — M170 Bilateral primary osteoarthritis of knee: Secondary | ICD-10-CM | POA: Diagnosis not present

## 2022-06-27 ENCOUNTER — Other Ambulatory Visit: Payer: Self-pay

## 2022-06-27 DIAGNOSIS — G43709 Chronic migraine without aura, not intractable, without status migrainosus: Secondary | ICD-10-CM

## 2022-06-27 MED ORDER — BOTOX 200 UNITS IJ SOLR: INTRAMUSCULAR | 4 refills | Status: DC

## 2022-06-27 NOTE — Telephone Encounter (Signed)
Refill request received from [facility], botox 200 units.   Refill sent Botox 200 units with 4 refills

## 2022-07-04 DIAGNOSIS — M170 Bilateral primary osteoarthritis of knee: Secondary | ICD-10-CM | POA: Diagnosis not present

## 2022-07-06 ENCOUNTER — Ambulatory Visit: Payer: Medicare PPO

## 2022-07-10 DIAGNOSIS — R399 Unspecified symptoms and signs involving the genitourinary system: Secondary | ICD-10-CM | POA: Diagnosis not present

## 2022-07-10 DIAGNOSIS — R319 Hematuria, unspecified: Secondary | ICD-10-CM | POA: Diagnosis not present

## 2022-07-11 DIAGNOSIS — M170 Bilateral primary osteoarthritis of knee: Secondary | ICD-10-CM | POA: Diagnosis not present

## 2022-07-11 DIAGNOSIS — M25561 Pain in right knee: Secondary | ICD-10-CM | POA: Diagnosis not present

## 2022-07-11 DIAGNOSIS — M25562 Pain in left knee: Secondary | ICD-10-CM | POA: Diagnosis not present

## 2022-07-18 ENCOUNTER — Telehealth: Payer: Self-pay

## 2022-07-18 NOTE — Telephone Encounter (Signed)
BotoxOne Benefit Verification [id number] Submitted!

## 2022-07-18 NOTE — Telephone Encounter (Signed)
Pharmacy Patient Advocate Encounter   Received notification that prior authorization for Botox 200UNIT solution is required/requested.    PA submitted on 07/18/2022  via CoverMyMeds Key [id number] Status is pending

## 2022-07-19 NOTE — Telephone Encounter (Signed)
Pharmacy Patient Advocate Encounter  Prior Authorization for Botox 200UNIT solution has been approved.    PA# [id number] Effective dates: 10/01/2019 through 08/07/2023.

## 2022-07-20 ENCOUNTER — Encounter: Payer: Self-pay | Admitting: *Deleted

## 2022-07-20 ENCOUNTER — Telehealth: Payer: Self-pay | Admitting: *Deleted

## 2022-07-20 NOTE — Patient Outreach (Signed)
  Care Coordination   Initial Visit Note   07/20/2022 Name: [name] MRN: [id number] DOB: Jun 01, 1951  [name] is a 71 y.o. year old female who sees Mila Palmer, MD for primary care. I spoke with  Annye Asa by phone today.  What matters to the patients health and wellness today?  Outreach to PCP office    Goals Addressed               This Visit's Progress     COMPLETED: Requesting consult with her provider (pt-stated)        Care Coordination Interventions: Advised patient to call the provider's office once again and speak with office admin/nurse or communicate through the portal with staff. Pt indicated she has left a message with the PCP through the portal this AM with no response. Pt informed to allow the office end of day if no response outreach once again to the provider office. Pt finished a round of antibiotics and requested additional due to the upcoming weekend. No symptoms indicated. Reviewed medications with patient and discussed adherence with no needed refills Reviewed scheduled/upcoming provider appointments including sufficient transportation source Screening for signs and symptoms of depression related to chronic disease state  Assessed social determinant of health barriers          SDOH assessments and interventions completed:  Yes  SDOH Interventions Today    Flowsheet Row Most Recent Value  SDOH Interventions   Food Insecurity Interventions Intervention Not Indicated  Housing Interventions Intervention Not Indicated  Transportation Interventions Intervention Not Indicated  Utilities Interventions Intervention Not Indicated        Care Coordination Interventions:  Yes, provided   Follow up plan: No further intervention required.   Encounter Outcome:  Pt. Visit Completed   Elliot Cousin, RN Care Management Coordinator [facility] Main Office [phone]

## 2022-07-20 NOTE — Patient Instructions (Signed)
Visit Information  Thank you for taking time to visit with me today. Please don't hesitate to contact me if I can be of assistance to you.   Following are the goals we discussed today:   Goals Addressed               This Visit's Progress     COMPLETED: Requesting consult with her provider (pt-stated)        Care Coordination Interventions: Advised patient to call the provider's office once again and speak with office admin/nurse or communicate through the portal with staff. Pt indicated she has left a message with the PCP through the portal this AM with no response. Pt informed to allow the office end of day if no response outreach once again to the provider office. Pt finished a round of antibiotics and requested additional due to the upcoming weekend. No symptoms indicated. Reviewed medications with patient and discussed adherence with no needed refills Reviewed scheduled/upcoming provider appointments including sufficient transportation source Screening for signs and symptoms of depression related to chronic disease state  Assessed social determinant of health barriers          Please call the care guide team at [phone] if you need to cancel or reschedule your appointment.   If you are experiencing a Mental Health or Behavioral Health Crisis or need someone to talk to, please call the Suicide and Crisis Lifeline: 988  Patient verbalizes understanding of instructions and care plan provided today and agrees to view in MyChart. Active MyChart status and patient understanding of how to access instructions and care plan via MyChart confirmed with patient.     No further follow up required: No further follow up needs at this time.  Elliot Cousin, RN Care Management Coordinator [facility] Main Office [phone]

## 2022-07-21 ENCOUNTER — Ambulatory Visit: Payer: Medicare PPO | Admitting: Neurology

## 2022-07-21 DIAGNOSIS — R319 Hematuria, unspecified: Secondary | ICD-10-CM | POA: Diagnosis not present

## 2022-07-26 DIAGNOSIS — M5136 Other intervertebral disc degeneration, lumbar region: Secondary | ICD-10-CM | POA: Diagnosis not present

## 2022-07-26 DIAGNOSIS — Z5181 Encounter for therapeutic drug level monitoring: Secondary | ICD-10-CM | POA: Diagnosis not present

## 2022-07-26 DIAGNOSIS — M5459 Other low back pain: Secondary | ICD-10-CM | POA: Diagnosis not present

## 2022-07-26 DIAGNOSIS — Z79899 Other long term (current) drug therapy: Secondary | ICD-10-CM | POA: Diagnosis not present

## 2022-08-03 ENCOUNTER — Encounter: Payer: Self-pay | Admitting: *Deleted

## 2022-08-03 NOTE — Telephone Encounter (Signed)
This encounter was created in error - please disregard.

## 2022-08-04 DIAGNOSIS — N3000 Acute cystitis without hematuria: Secondary | ICD-10-CM | POA: Diagnosis not present

## 2022-08-05 ENCOUNTER — Encounter: Payer: Self-pay | Admitting: Neurology

## 2022-08-08 ENCOUNTER — Encounter: Payer: Self-pay | Admitting: Neurology

## 2022-08-08 ENCOUNTER — Ambulatory Visit (INDEPENDENT_AMBULATORY_CARE_PROVIDER_SITE_OTHER): Payer: Medicare PPO

## 2022-08-08 DIAGNOSIS — G43709 Chronic migraine without aura, not intractable, without status migrainosus: Secondary | ICD-10-CM

## 2022-08-08 MED ORDER — DEXAMETHASONE SODIUM PHOSPHATE 10 MG/ML IJ SOLN
10.0000 mg | Freq: Once | INTRAMUSCULAR | Status: AC
  Administered 2022-08-08: 10 mg via INTRAMUSCULAR

## 2022-08-08 MED ORDER — DIPHENHYDRAMINE HCL 50 MG/ML IJ SOLN
50.0000 mg | Freq: Once | INTRAMUSCULAR | Status: AC
  Administered 2022-08-08: 50 mg via INTRAMUSCULAR

## 2022-08-08 MED ORDER — METOCLOPRAMIDE HCL 5 MG/ML IJ SOLN
10.0000 mg | Freq: Once | INTRAMUSCULAR | Status: AC
  Administered 2022-08-08: 10 mg via INTRAMUSCULAR

## 2022-08-08 NOTE — Progress Notes (Signed)
Per patient chart, she has an allergy to Asprin, Toradol and or Can not take  anything close to it due to Ulcer. Per Patient she is unsure why that is in her chart. And she unsure if she has used Toradol in the past , Advised patient I will have consult with Dr.patel.  Per Dr.Patel please give patient Dexamethazone 10 mg, Benadryl 25 mg and Reglan  10 mg.    Dexamethazone 10 mg, Benadryl 25 mg and Reglan  10 mg. Given. Patient tolerated well.

## 2022-08-10 ENCOUNTER — Ambulatory Visit: Payer: Medicare PPO | Admitting: Internal Medicine

## 2022-08-10 ENCOUNTER — Telehealth: Payer: Self-pay

## 2022-08-10 NOTE — Telephone Encounter (Signed)
Called to check on patient,Patient feeling better today She feels a little tired and worn down.   Patient will be here for her Botox appt 08/18/22.

## 2022-08-18 ENCOUNTER — Ambulatory Visit: Payer: Medicare PPO | Admitting: Neurology

## 2022-08-18 DIAGNOSIS — G43709 Chronic migraine without aura, not intractable, without status migrainosus: Secondary | ICD-10-CM | POA: Diagnosis not present

## 2022-08-18 MED ORDER — ONABOTULINUMTOXINA 100 UNITS IJ SOLR
200.0000 [IU] | Freq: Once | INTRAMUSCULAR | Status: AC
  Administered 2022-08-18: 155 [IU] via INTRAMUSCULAR

## 2022-08-18 NOTE — Addendum Note (Signed)
Addended by: Venetia Night on: 08/18/2022 03:58 PM   Modules accepted: Orders

## 2022-08-18 NOTE — Progress Notes (Signed)
  [facility]   Procedure Note Botox  Attending: Dr. Metta Clines  Preoperative Diagnosis(es): Chronic migraine  Consent obtained from: The patient Benefits discussed included, but were not limited to decreased muscle tightness, increased joint range of motion, and decreased pain.  Risk discussed included, but were not limited pain and discomfort, bleeding, bruising, excessive weakness, venous thrombosis, muscle atrophy and dysphagia.  Anticipated outcomes of the procedure as well as he risks and benefits of the alternatives to the procedure, and the roles and tasks of the personnel to be involved, were discussed with the patient, and the patient consents to the procedure and agrees to proceed. A copy of the patient medication guide was given to the patient which explains the blackbox warning.  Patients identity and treatment sites confirmed Yes.  .  Details of Procedure: Skin was cleaned with alcohol. Prior to injection, the needle plunger was aspirated to make sure the needle was not within a blood vessel.  There was no blood retrieved on aspiration.    Following is a summary of the muscles injected  And the amount of Botulinum toxin used:  Dilution 200 units of Botox was reconstituted with 4 ml of preservative free normal saline. Time of reconstitution: At the time of the office visit (<30 minutes prior to injection)   Injections  155 total units of Botox was injected with a 30 gauge needle.  Injection Sites: L occipitalis: 15 units- 3 sites  R occiptalis: 15 units- 3 sites  L upper trapezius: 15 units- 3 sites R upper trapezius: 15 units- 3 sits          L paraspinal: 10 units- 2 sites R paraspinal: 10 units- 2 sites  Face L frontalis(2 injection sites):10 units   R frontalis(2 injection sites):10 units         L corrugator: 5 units   R corrugator: 5 units           Procerus: 5 units   L temporalis: 20 units R temporalis: 20 units   Agent:  200 units of botulinum  Type A (Onobotulinum Toxin type A) was reconstituted with 4 ml of preservative free normal saline.  Time of reconstitution: At the time of the office visit (<30 minutes prior to injection)     Total injected (Units):  155  Total wasted (Units):  45  Patient tolerated procedure well without complications.   Reinjection is anticipated in 3 months.

## 2022-08-22 ENCOUNTER — Telehealth: Payer: Self-pay | Admitting: Neurology

## 2022-08-22 ENCOUNTER — Other Ambulatory Visit: Payer: Self-pay | Admitting: Neurology

## 2022-08-22 MED ORDER — SUMATRIPTAN SUCCINATE 3 MG/0.5ML ~~LOC~~ SOAJ: 3.0000 mg | SUBCUTANEOUS | 5 refills | Status: DC | PRN

## 2022-08-22 NOTE — Telephone Encounter (Signed)
Pt called in stating [facility] doesn't carry the [place]. She is not sure where else to check to get one.

## 2022-08-22 NOTE — Telephone Encounter (Signed)
Called and talked to pt. She did not mean Epipen. She was talking about Sumatripan injection for headaches. Informed her that Dr. Tomi Likens sent in a prescription for her at [place] her the directions for taking it and also sent it through [facility].

## 2022-08-29 NOTE — Progress Notes (Unsigned)
NEUROLOGY FOLLOW UP OFFICE NOTE  [name] [id number]  Assessment/Plan:   1.  Memory deficits - likely related to underlying psychiatric distress.  However, given family history, must consider underlying Alzheimer's 2.  Migraine without aura, without status migrainosus, not intractable - flare up after missing round of Botox and recent emotional stress.  Now improved.   1.  Will schedule neuropsychological evaluation.  Also check TSH and B12 2.  Migraine prevention:  Botox Q3 months and Zonisamide 200mg  daily 3.  Migraine rescue:  rizatriptan 10mg ; sumatriptan 3mg  [place] for intractable/severe migraines (not to take within 24 hours of each other 4  Follow up 6 months.   Subjective:  [name] is a 72 year old right-handed Caucasian woman with depression, anxiety and history of benzodiazepine and barbiturates dependence and gastric ulcer who follows up for migraines and concern about her memory.[name]   UPDATE:  Current NSAIDS:  contraindicated Current analgesics:  Tramadol; Tylenol Current triptans:  rizatriptan 10mg , sumatriptan 3mg  [place] Current ergotamine:  no Current anti-emetic:  Promethazine 25mg -50mg  Current muscle relaxants:  no Current Antihypertensive medications:  no Current Antidepressant medications:  no Current Anticonvulsant medications:  zonisamide 200mg  daily Current anti-CGRP:  none Current Vitamins/Herbal/Supplements:  D Current Antihistamines/Decongestants:  no Other therapy:   Botox (since 01/03/2019)    She has had problems with short term memory since around 2021-2022, which has been getting worse over the past year.  She needs to keep sticky notes as reminders. Needs to have all appointments in her phone.  She does have increased emotional stress related.  Her daughter is going through a divorce.  Several years ago, she had an elevated TSH but further workup was negative.  She is concerned because her sister had Alzheimer's disease.      Caffeine:  1 cup 1/2 decaf coffee daily Alcohol:  no Smoker:  no Diet:  hydrates Exercise:  Stopped due to torn knee meniscus Depression:  no; Anxiety:  yes. Other pain:  back pain Sleep hygiene:  poor   HISTORY:  Onset: 2016.  She has remote history of migraines which were controlled for many years.  In December 2015, her husband unexpectedly passed away, which triggered depression and anxiety.  She subsequently recovered.  However, in early June 2016, she witnessed her boyfriend's ill father pass away.  It triggered flashbacks of her husband's death, which increased depression, anxiety and migraines.  She has significant history of side effects to multiple medications. Location:  Left peri-orbital Quality:  pounding Initial Intensity:  10/10 Aura:  no Prodrome:  no Associated symptoms: Nausea, photophobia, phonophobia, osmophobia Initial Duration:  All day Initial Frequency:  15 days per month Triggers: Emotional stress Relieving factors: Tramadol Activity:  aggravates   Past NSAIDS:  Oral NSAIDs contraindicated due to bleeding ulcer Past analgesics:  Cannot take ASA products such as Excedrin due to bleeding ulcer Past abortive triptans:  Eletriptan (took it multiple times but caused chest tightness once, but was effective), sumatriptan Past anti-emetic:  Zofran 8mg  (effective) Past anti-anxiolytic:  benzodiazepines Past antihypertensive medications:  propranolol (chest tightness) Past antidepressant medications:  Multiple, such as Cymbalta.  She has had intolerance to multiple antidepressants over the years. Past anticonvulsant medications:  topiramate (chest tightness); Depakote ER 250mg  daily (higher doses caused increased hunger) Past anti-CGRP:  Aimovig (constipation, nausea), Roselyn Meier Past vitamins/Herbal/Supplements:  no   Family history of headache:  Mom, sister   MRI and MRA of head from 11/05/10 were personally reviewed and were unremarkable. Due to persistent  headache, she had another MRI of brain with and without contrast on 11/17/2020 which showed possible left paranasal sinusitis but nothing concerning.  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 pylori infection    History of blood transfusion 1984   Hx of colonic polyps    Hypercholesteremia    Hyponatremia    Migraines    Vitamin D deficiency     MEDICATIONS: Current Outpatient Medications on File Prior to Visit  Medication Sig Dispense Refill   botulinum toxin Type A (BOTOX) 200 units injection Inject 155 units IM into Multiple sites of the face neck and head every 90 days. 1 each 4   Calcium 600-200 MG-UNIT tablet Take 1 tablet by mouth daily.     docusate sodium (COLACE) 100 MG capsule Take 100 mg by mouth at bedtime.      OnabotulinumtoxinA (BOTOX IJ)      polyethylene glycol (MIRALAX / GLYCOLAX) packet Take 17 g by mouth at bedtime.     promethazine (PHENERGAN) 25 MG tablet Take 1 to 2 tablets every 6 hours as needed for nausea. 30 tablet 5   rizatriptan (MAXALT-MLT) 10 MG disintegrating tablet Take 1 tablet (10 mg total) by mouth as needed for migraine. May repeat in 2 hours if needed 9 tablet 6   Simethicone (PHAZYME MAXIMUM STRENGTH) 250 MG CAPS Take 250 mg by mouth daily as needed (gas). (Patient not taking: Reported on 12/01/2020)     SUMAtriptan Succinate 3 MG/0.5ML SOAJ Inject 3 mg into the skin as needed. May repeat after 1 hour.  Maximum 2 injections in 24 hours. 3 mL 5   traMADol (ULTRAM) 50 MG tablet Take 1 tablet by mouth 4 (four) times daily as needed.     UBRELVY 100 MG TABS TAKE ONE TABLET AS NEEDED- MAY REPEAT 1 TABLET IN 2 HOURS. MAX OF 2/24 HOURS. 10 tablet 2   XIIDRA 5 % SOLN Place 1 drop into both eyes 2 (two) times daily.      zonisamide (ZONEGRAN) 100 MG capsule TAKE TWO CAPSULES BY MOUTH DAILY 60  capsule 5   No current facility-administered medications on file prior to visit.    ALLERGIES: Allergies  Allergen Reactions   Aspirin Other (See Comments)    Hx of bleeding stomach ulcer    Crestor [Rosuvastatin Calcium] Itching   Inderal [Propranolol] Other (See Comments)    Chest tightness   Macrobid [Nitrofurantoin Monohyd Macro] Diarrhea   Other     Other reaction(s): Other (See Comments) Cholesterol meds-multiple reactions to multiple meds   Relpax [Eletriptan Hydrobromide] Other (See Comments)    Chest Tightness    Salicylates Other (See Comments)    Told not to take due to hx of GI ulcer   Topamax [Topiramate] Other (See Comments)    Chest tightness    FAMILY HISTORY: Family History  Problem Relation Age of Onset   Migraines Mother    Heart attack Mother    Migraines Sister    Cancer Father        Prostate      Objective:  Blood pressure (!) 141/70, pulse 86, resp. rate 18, height 5' (1.524 m), weight 148 lb (67.1 kg), last menstrual period 08/07/2001, SpO2 99 %. General: No acute distress.  Patient appears well-groomed.  12/01/2019    2:00 PM 08/30/2022   10:00 AM  [employer] Mental Exam  Weekday Correct 0 1  Current year 1 1  What state are we in? 1 1  Amount spent 3 1  Amount left 2 0  # of Animals 2 2  5  objects recall 4 2  Number series 1 1  Hour markers 2 0  Time correct 0 0  Placed X in triangle correctly 1 1  Largest Figure 1 1  Name of female 2 2  Date back to work 0 0  Type of work 2 2  State she lived in 0 0  Total score 22 [place], DO  CC: Jonathon Jordan, MD

## 2022-08-30 ENCOUNTER — Ambulatory Visit: Payer: Medicare PPO | Admitting: Neurology

## 2022-08-30 ENCOUNTER — Other Ambulatory Visit (INDEPENDENT_AMBULATORY_CARE_PROVIDER_SITE_OTHER): Payer: Medicare PPO

## 2022-08-30 ENCOUNTER — Encounter: Payer: Self-pay | Admitting: Neurology

## 2022-08-30 VITALS — BP 141/70 | HR 86 | Resp 18 | Ht 60.0 in | Wt 148.0 lb

## 2022-08-30 DIAGNOSIS — G43109 Migraine with aura, not intractable, without status migrainosus: Secondary | ICD-10-CM

## 2022-08-30 DIAGNOSIS — R413 Other amnesia: Secondary | ICD-10-CM

## 2022-08-30 LAB — TSH: TSH: 2.99 u[IU]/mL (ref 0.35–5.50)

## 2022-08-30 LAB — VITAMIN B12: Vitamin B-12: 315 pg/mL (ref 211–911)

## 2022-08-30 NOTE — Patient Instructions (Signed)
Schedule neuropsychological evaluation Check TSH and B12 Continue Botox Continue rizatriptan OR sumatriptan shot (cannot take both within 24 hours of each other) Follow up 6 months.

## 2022-08-31 ENCOUNTER — Telehealth: Payer: Self-pay | Admitting: *Deleted

## 2022-08-31 NOTE — Telephone Encounter (Signed)
-----  Message from Molinda Bailiff sent at 08/31/2022  3:44 PM EST ----- This patient called in saying that she is experiencing severe pain bc of dryness. She said dr. Quincy Simmonds gave her a prescription before for dryness and she wants to know if she can get that again without coming into the office. She stated if she needs to come into the office she will but the pain is unbearable from the dryness.she wants to know if someone can look in her chart and see if she can get that prescription from before.

## 2022-08-31 NOTE — Telephone Encounter (Signed)
Left message for pt to return our call. Pt will need to be seen in office. Last office visit 05/25/2020.

## 2022-08-31 NOTE — Progress Notes (Signed)
Tried calling patient no answer. LMOVM to call the office back.

## 2022-09-01 NOTE — Telephone Encounter (Signed)
Pt scheduled for 09/20/2022 

## 2022-09-05 ENCOUNTER — Ambulatory Visit: Payer: Medicare PPO | Admitting: Obstetrics and Gynecology

## 2022-09-05 ENCOUNTER — Encounter: Payer: Self-pay | Admitting: Obstetrics and Gynecology

## 2022-09-05 VITALS — BP 118/82 | HR 66 | Wt 148.0 lb

## 2022-09-05 DIAGNOSIS — N952 Postmenopausal atrophic vaginitis: Secondary | ICD-10-CM | POA: Diagnosis not present

## 2022-09-05 DIAGNOSIS — B3731 Acute candidiasis of vulva and vagina: Secondary | ICD-10-CM

## 2022-09-05 LAB — WET PREP FOR TRICH, YEAST, CLUE

## 2022-09-05 MED ORDER — FLUCONAZOLE 150 MG PO TABS: 150.0000 mg | ORAL_TABLET | Freq: Once | ORAL | 0 refills | Status: AC

## 2022-09-05 NOTE — Patient Instructions (Addendum)
You have a vaginal yeast infection. Take diflucan one tablet now and one in 3 days if you don't feel completely better.  If you continue to have vaginal dryness or irritation, then call the office for a script for vaginal estrogen (as we have discussed).  Vaginal Yeast Infection, Adult  Vaginal yeast infection is a condition that causes vaginal discharge as well as soreness, swelling, and redness (inflammation) of the vagina. This is a common condition. Some women get this infection frequently. What are the causes? This condition is caused by a change in the normal balance of the yeast (Candida) and normal bacteria that live in the vagina. This change causes an overgrowth of yeast, which causes the inflammation. What increases the risk? The condition is more likely to develop in women who: Take antibiotic medicines. Have diabetes. Take birth control pills. Are pregnant. Douche often. Have a weak body defense system (immune system). Have been taking steroid medicines for a long time. Frequently wear tight clothing. What are the signs or symptoms? Symptoms of this condition include: White, thick, creamy vaginal discharge. Swelling, itching, redness, and irritation of the vagina. The lips of the vagina (labia) may be affected as well. Pain or a burning feeling while urinating. Pain during sex. How is this diagnosed? This condition is diagnosed based on: Your medical history. A physical exam. A pelvic exam. Your health care provider will examine a sample of your vaginal discharge under a microscope. Your health care provider may send this sample for testing to confirm the diagnosis. How is this treated? This condition is treated with medicine. Medicines may be over-the-counter or prescription. You may be told to use one or more of the following: Medicine that is taken by mouth (orally). Medicine that is applied as a cream (topically). Medicine that is inserted directly into the vagina  (suppository). Follow these instructions at home: Take or apply over-the-counter and prescription medicines only as told by your health care provider. Do not use tampons until your health care provider approves. Do not have sex until your infection has cleared. Sex can prolong or worsen your symptoms of infection. Ask your health care provider when it is safe to resume sexual activity. Keep all follow-up visits. This is important. How is this prevented?  Do not wear tight clothes, such as pantyhose or tight pants. Wear breathable cotton underwear. Do not use douches, perfumed soap, creams, or powders. Wipe from front to back after using the toilet. If you have diabetes, keep your blood sugar levels under control. Ask your health care provider for other ways to prevent yeast infections. Contact a health care provider if: You have a fever. Your symptoms go away and then return. Your symptoms do not get better with treatment. Your symptoms get worse. You have new symptoms. You develop blisters in or around your vagina. You have blood coming from your vagina and it is not your menstrual period. You develop pain in your abdomen. Summary Vaginal yeast infection is a condition that causes discharge as well as soreness, swelling, and redness (inflammation) of the vagina. This condition is treated with medicine. Medicines may be over-the-counter or prescription. Take or apply over-the-counter and prescription medicines only as told by your health care provider. Do not douche. Resume sexual activity or use of tampons as instructed by your health care provider. Contact a health care provider if your symptoms do not get better with treatment or your symptoms go away and then return. This information is not intended to replace  advice given to you by your health care provider. Make sure you discuss any questions you have with your health care provider. Document Revised: 10/11/2020 Document Reviewed:  10/11/2020 Elsevier Patient Education  [place].  Atrophic Vaginitis  Atrophic vaginitis is a condition in which the tissues that line the vagina become dry and thin. This condition is most common in women who have stopped having regular menstrual periods (are in menopause). This usually starts when a woman is 32 to 71 years old. That is the time when a woman's estrogen levels begin to decrease. Estrogen is a female hormone. It helps to keep the tissues of the vagina moist. It stimulates the vagina to produce a clear fluid that lubricates the vagina for sex. This fluid also protects the vagina from infection. Lack of estrogen can cause the lining of the vagina to get thinner and dryer. The vagina may also shrink in size. It may become less elastic. Atrophic vaginitis tends to get worse over time as a woman's estrogen level drops. What are the causes? This condition is caused by the normal drop in estrogen that happens around the time of menopause. What increases the risk? Certain conditions or situations may lower a woman's estrogen level, leading to a higher risk for atrophic vaginitis. You are more likely to develop this condition if: You are taking medicines that block estrogen. You have had your ovaries removed. You are being treated for cancer with radiation or medicines (chemotherapy). You have given birth or are breastfeeding. You are older than age 53. You smoke. What are the signs or symptoms? Symptoms of this condition include: Pain, soreness, a feeling of pressure, or bleeding during sex (dyspareunia). Vaginal burning, irritation, or itching. Pain or bleeding when a speculum is used in a vaginal exam. Having burning pain while urinating. Vaginal discharge. In some cases, there are no symptoms. How is this diagnosed? This condition is diagnosed based on your medical history and a physical exam. This will include a pelvic exam that checks the vaginal tissues. Though rare,  you may also have other tests, including: A urine test. A test that checks the acid balance in your vagina (acid balance test). How is this treated? Treatment for this condition depends on how severe your symptoms are. Treatment may include: Using an over-the-counter vaginal lubricant before sex. Using a long-acting vaginal moisturizer. Using low-dose estrogen for moderate to severe symptoms that do not respond to other treatments. Options include creams, tablets, and inserts (vaginal rings). Before you use a vaginal estrogen, tell your health care provider if you have a history of: Breast cancer. Endometrial cancer. Blood clots. If you are not sexually active and your symptoms are very mild, you may not need treatment. Follow these instructions at home: Medicines Take over-the-counter and prescription medicines only as told by your health care provider. Do not use herbal or alternative medicines unless your health care provider says that you can. Use over-the-counter creams, lubricants, or moisturizers for dryness only as told by your health care provider. General instructions If your atrophic vaginitis is caused by menopause, discuss all of your menopause symptoms and treatment options with your health care provider. Do not douche. Do not use products that can make your vagina dry. These include: Scented feminine sprays. Scented tampons. Scented soaps. Vaginal sex can help to improve blood flow and elasticity of vaginal tissue. If you choose to have sex and it hurts, try using a water-soluble lubricant or moisturizer right before having sex. Contact a  health care provider if: Your discharge looks different than normal. Your vagina has an unusual smell. You have new symptoms. Your symptoms do not improve with treatment. Your symptoms get worse. Summary Atrophic vaginitis is a condition in which the tissues that line the vagina become dry and thin. It is most common in women who have  stopped having regular menstrual periods (are in menopause). Treatment options include using vaginal lubricants and low-dose vaginal estrogen. Contact a health care provider if your vagina has an unusual smell, or if your symptoms get worse or do not improve after treatment. This information is not intended to replace advice given to you by your health care provider. Make sure you discuss any questions you have with your health care provider. Document Revised: 01/22/2020 Document Reviewed: 01/22/2020 Elsevier Patient Education  [place].

## 2022-09-05 NOTE — Telephone Encounter (Signed)
For dryness, she can try cooking oil or over the counter lubricant like [place] Jelly or Replens.   Could it be a yeast infection?  For any prescription, she will need an office visit.

## 2022-09-05 NOTE — Progress Notes (Signed)
72 y.o. G89P1002 Widowed White or Caucasian Not Hispanic or Latino female here for vaginal dryness. She c/o dryness for several months. She has tried replens which helped some. No pruritus, occasional burning. Just dry and uncomfortable.  Not sexually active.   H/O hysterectomy     Patient's last menstrual period was 08/07/2001 (approximate).          Sexually active: No.  The current method of family planning is status post hysterectomy.    Exercising: No.  The patient does not participate in regular exercise at present. Smoker:  no  Health Maintenance: Pap:  05/20/18 WNL 01/17/17 WNL  History of abnormal Pap:   Yes, 07-17-16 LGSIL:Neg HR HPV  MMG:  01/10/21 density B Bi-rads 1 neg  BMD:   01/09/20  Colonoscopy: Result :Osteoporosis of hip, osteopenia of spine. seeing Dr.Gherghe TDaP:  PCP Gardasil:   no   reports that she has never smoked. She has never used smokeless tobacco. She reports that she does not drink alcohol and does not use drugs.  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 pylori infection    History of blood transfusion 1984   Hx of colonic polyps    Hypercholesteremia    Hyponatremia    Migraines    Vitamin D deficiency     Past Surgical History:  Procedure Laterality Date   ABDOMINAL HYSTERECTOMY     ANTERIOR AND POSTERIOR REPAIR N/A 04/24/2017   Procedure: ANTERIOR (CYSTOCELE) AND POSTERIOR (RECTOCELE)  REPAIR;  Surgeon: Nunzio Cobbs, MD;  Location: [facility];  Service: Gynecology;  Laterality: N/A;   CESAREAN SECTION  1984   COLONOSCOPY  2012   COSMETIC SURGERY     Neck lift   CYSTOSCOPY N/A 04/24/2017   Procedure: CYSTOSCOPY;  Surgeon: Nunzio Cobbs, MD;  Location: [facility];  Service: Gynecology;  Laterality: N/A;   HEMORRHOID SURGERY  1984   LAPAROSCOPIC LYSIS OF ADHESIONS N/A 04/24/2017    Procedure: EXTENSIVE LAPAROSCOPIC LYSIS OF ADHESIONS;  Surgeon: Nunzio Cobbs, MD;  Location: [facility];  Service: Gynecology;  Laterality: N/A;   LAPAROSCOPIC VAGINAL HYSTERECTOMY WITH SALPINGO OOPHORECTOMY Bilateral 04/24/2017   Procedure: LAPAROSCOPIC ASSISTED VAGINAL HYSTERECTOMY WITH SALPINGO OOPHORECTOMY and pelvic washings;  Surgeon: Nunzio Cobbs, MD;  Location: [facility];  Service: Gynecology;  Laterality: Bilateral;  3 hours   RECTAL PROLAPSE REPAIR     Dr. Lennie Hummer   UMBILICAL HERNIA REPAIR     had surgery as a child.    Current Outpatient Medications  Medication Sig Dispense Refill   botulinum toxin Type A (BOTOX) 200 units injection Inject 155 units IM into Multiple sites of the face neck and head every 90 days. 1 each 4   Calcium 600-200 MG-UNIT tablet Take 1 tablet by mouth daily.     docusate sodium (COLACE) 100 MG capsule Take 100 mg by mouth at bedtime.      OnabotulinumtoxinA (BOTOX IJ)      polyethylene glycol (MIRALAX / GLYCOLAX) packet Take 17 g by mouth at bedtime.     promethazine (PHENERGAN) 25 MG tablet Take 1 to 2 tablets every 6 hours as needed for nausea. 30 tablet 5   rizatriptan (MAXALT-MLT) 10 MG disintegrating tablet Take 1 tablet (10 mg total) by  mouth as needed for migraine. May repeat in 2 hours if needed 9 tablet 6   Simethicone (PHAZYME MAXIMUM STRENGTH) 250 MG CAPS Take 250 mg by mouth daily as needed (gas).     SUMAtriptan Succinate 3 MG/0.5ML SOAJ Inject 3 mg into the skin as needed. May repeat after 1 hour.  Maximum 2 injections in 24 hours. 3 mL 5   traMADol (ULTRAM) 50 MG tablet Take 1 tablet by mouth 4 (four) times daily as needed.     XIIDRA 5 % SOLN Place 1 drop into both eyes 2 (two) times daily.     zonisamide (ZONEGRAN) 100 MG capsule TAKE TWO CAPSULES BY MOUTH DAILY 60 capsule 5   No current facility-administered medications for this visit.    Family History  Problem Relation Age of Onset   Migraines Mother    Heart  attack Mother    Migraines Sister    Cancer Father        Prostate    Review of Systems  Genitourinary:        Vaginal dryness   All other systems reviewed and are negative.   Exam:   BP 118/82   Pulse 66   Wt 148 lb (67.1 kg)   LMP 08/07/2001 (Approximate)   SpO2 100%   BMI 28.90 kg/m   Weight change: @WEIGHTCHANGE @ Height:      Ht Readings from Last 3 Encounters:  08/30/22 5' (1.524 m)  12/01/20 5' (1.524 m)  05/25/20 4' 11.25" (1.505 m)    General appearance: alert, cooperative and appears stated age   Pelvic: External genitalia:  no lesions              Urethra:  normal appearing urethra with no masses, tenderness or lesions              Bartholins and Skenes: normal                 Vagina: atrophic appearing vagina with normal color and discharge, no lesions              Cervix: absent         Gae Dry, CMA chaperoned for the exam.  1. Yeast vaginitis - WET PREP FOR TRICH, YEAST, CLUE - fluconazole (DIFLUCAN) 150 MG tablet; Take 1 tablet (150 mg total) by mouth once for 1 dose. Take one tablet.  Repeat in 72 hours if symptoms are not completely resolved.  Dispense: 2 tablet; Refill: 0  2. Vaginal atrophy Her c/o vaginal dryness started after she was treated for a UTI, suspect her symptoms may be from her yeast infection. If she continues to have symptoms after treating the yeast, we have discussed starting vaginal estrogen. -She needs a mammogram -F/U for a Breast and Pelvic exam  Over 30 minutes in total patient care

## 2022-09-05 NOTE — Telephone Encounter (Signed)
Left message for pt to return our call. 

## 2022-09-06 ENCOUNTER — Telehealth: Payer: Self-pay

## 2022-09-06 MED ORDER — SUMATRIPTAN SUCCINATE 3 MG/0.5ML ~~LOC~~ SOAJ: 3.0000 mg | SUBCUTANEOUS | 5 refills | Status: DC | PRN

## 2022-09-06 NOTE — Telephone Encounter (Signed)
[name] said she was speaking with someone and they asked about sumatriptan, they have it but it was under [employer] which would cost a $1,000 and that does require a PA. Which has been started but if we need more information to call them back.

## 2022-09-06 NOTE — Telephone Encounter (Signed)
Patient is calling in saying that the pharmacy told her that we havent approved of SUMAtriptan Succinate 3 MG/0.5ML SOAJ . Patient is unsure if this means she needs a PA or if we need to approve the refill. Asking for a call back.

## 2022-09-07 ENCOUNTER — Telehealth: Payer: Self-pay | Admitting: Pharmacy Technician

## 2022-09-07 NOTE — Telephone Encounter (Signed)
Patient Advocate Encounter   Received notification that prior authorization for Zembrace SymTouch 3MG /0.5ML auto-injectors is required.   PA submitted on 09/07/2022 Key [id number] Status is pending       Lyndel Safe, [place] Patient Advocate Specialist [facility] Patient Advocate Team Direct Number: [phone]  Fax: [phone]

## 2022-09-08 NOTE — Telephone Encounter (Signed)
Prolia VOB initiated via [email or web address]  Last Prolia inj 05/02/22 Next Prolia inj due 11/01/22  

## 2022-09-08 NOTE — Telephone Encounter (Signed)
PA has been APPROVED from 08/07/2022-08/07/2023.  Approval letter has been attached in patient documents.

## 2022-09-20 ENCOUNTER — Ambulatory Visit: Payer: Medicare PPO | Admitting: Obstetrics and Gynecology

## 2022-10-02 NOTE — Telephone Encounter (Signed)
Prior auth renewal required for [employer]  PA PROCESS DETAILS: PA is required. PA can be initiated by calling [phone] or online at [email or web address]

## 2022-10-03 ENCOUNTER — Telehealth: Payer: Self-pay | Admitting: Neurology

## 2022-10-03 NOTE — Telephone Encounter (Signed)
Pt called in and left a message with the access nurse. She would like to speak with someone about the insurance and botox.

## 2022-10-03 NOTE — Telephone Encounter (Signed)
Per Patient everything is okay now. She got it ordered.

## 2022-10-16 NOTE — Telephone Encounter (Signed)
Prior Authorization initiated for PROLIA via [email or web address] KEY: [id number]

## 2022-10-18 NOTE — Telephone Encounter (Signed)
APPROVED  KeyNorwood Levo PA Case ID #: [id number] Valid: 10/12/20-08/07/23

## 2022-10-19 DIAGNOSIS — M170 Bilateral primary osteoarthritis of knee: Secondary | ICD-10-CM | POA: Diagnosis not present

## 2022-10-19 NOTE — Telephone Encounter (Signed)
Pt ready for scheduling on or after 11/01/22  Out-of-pocket cost due at time of visit: $35  Primary: [employer] Adv [place] PPO Prolia co-insurance: 0% Admin fee co-insurance: $35  Secondary: n/a Prolia co-insurance:  Admin fee co-insurance:   Deductible: does not apply  Prior Auth: APPROVED PA Case ID #: [id number] Valid: 10/12/20-08/07/23    ** This summary of benefits is an estimation of the patient's out-of-pocket cost. Exact cost may very based on individual plan coverage.

## 2022-10-20 ENCOUNTER — Ambulatory Visit: Payer: Medicare PPO | Admitting: Neurology

## 2022-10-24 NOTE — Progress Notes (Deleted)
72 y.o. G47P1002 Widowed Caucasian female here for annual exam.    PCP:     Patient's last menstrual period was 08/07/2001 (approximate).           Sexually active: {yes no:314532}  The current method of family planning is status post hysterectomy.    Exercising: {yes no:314532}  {types:19826} Smoker:  no  Health Maintenance: Pap:  05/20/18 neg, 01/17/17 neg, 07-17-16 LGSIL:Neg HR HPV  History of abnormal Pap:  yes, 07-17-16 LGSIL:Neg HR HPV  MMG:  01/10/21 Breast Density Category B, BI-RADS CATEGORY 1 neg Colonoscopy:  IFOB 2020 BMD:   01/09/20  Result  osteoporotic TDaP:  PCP Gardasil:   no HIV: 01/17/17 NR Hep C: 01/17/17 neg Screening Labs:  Hb today: ***, Urine today: ***   reports that she has never smoked. She has never used smokeless tobacco. She reports that she does not drink alcohol and does not use drugs.  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 pylori infection    History of blood transfusion 1984   Hx of colonic polyps    Hypercholesteremia    Hyponatremia    Migraines    Vitamin D deficiency     Past Surgical History:  Procedure Laterality Date   ABDOMINAL HYSTERECTOMY     ANTERIOR AND POSTERIOR REPAIR N/A 04/24/2017   Procedure: ANTERIOR (CYSTOCELE) AND POSTERIOR (RECTOCELE)  REPAIR;  Surgeon: Nunzio Cobbs, MD;  Location: [facility];  Service: Gynecology;  Laterality: N/A;   CESAREAN SECTION  1984   COLONOSCOPY  2012   COSMETIC SURGERY     Neck lift   CYSTOSCOPY N/A 04/24/2017   Procedure: CYSTOSCOPY;  Surgeon: Nunzio Cobbs, MD;  Location: [facility];  Service: Gynecology;  Laterality: N/A;   HEMORRHOID SURGERY  1984   LAPAROSCOPIC LYSIS OF ADHESIONS N/A 04/24/2017   Procedure: EXTENSIVE LAPAROSCOPIC LYSIS OF ADHESIONS;  Surgeon: Nunzio Cobbs, MD;  Location: [facility];  Service: Gynecology;   Laterality: N/A;   LAPAROSCOPIC VAGINAL HYSTERECTOMY WITH SALPINGO OOPHORECTOMY Bilateral 04/24/2017   Procedure: LAPAROSCOPIC ASSISTED VAGINAL HYSTERECTOMY WITH SALPINGO OOPHORECTOMY and pelvic washings;  Surgeon: Nunzio Cobbs, MD;  Location: [facility];  Service: Gynecology;  Laterality: Bilateral;  3 hours   RECTAL PROLAPSE REPAIR     Dr. Lennie Hummer   UMBILICAL HERNIA REPAIR     had surgery as a child.    Current Outpatient Medications  Medication Sig Dispense Refill   botulinum toxin Type A (BOTOX) 200 units injection Inject 155 units IM into Multiple sites of the face neck and head every 90 days. 1 each 4   Calcium 600-200 MG-UNIT tablet Take 1 tablet by mouth daily.     docusate sodium (COLACE) 100 MG capsule Take 100 mg by mouth at bedtime.      OnabotulinumtoxinA (BOTOX IJ)      polyethylene glycol (MIRALAX / GLYCOLAX) packet Take 17 g by mouth at bedtime.     promethazine (PHENERGAN) 25 MG tablet Take 1 to 2 tablets every 6 hours as needed for nausea. 30 tablet 5   rizatriptan (MAXALT-MLT) 10 MG disintegrating tablet Take 1 tablet (10 mg total) by mouth as needed for migraine. May repeat in 2 hours if needed 9 tablet 6   Simethicone (PHAZYME  MAXIMUM STRENGTH) 250 MG CAPS Take 250 mg by mouth daily as needed (gas).     SUMAtriptan Succinate 3 MG/0.5ML SOAJ Inject 3 mg into the skin as needed. May repeat after 1 hour.  Maximum 2 injections in 24 hours. 3 mL 5   traMADol (ULTRAM) 50 MG tablet Take 1 tablet by mouth 4 (four) times daily as needed.     XIIDRA 5 % SOLN Place 1 drop into both eyes 2 (two) times daily.     zonisamide (ZONEGRAN) 100 MG capsule TAKE TWO CAPSULES BY MOUTH DAILY 60 capsule 5   No current facility-administered medications for this visit.    Family History  Problem Relation Age of Onset   Migraines Mother    Heart attack Mother    Migraines Sister    Cancer Father        Prostate    Review of Systems  Exam:   LMP 08/07/2001 (Approximate)      General appearance: alert, cooperative and appears stated age Head: normocephalic, without obvious abnormality, atraumatic Neck: no adenopathy, supple, symmetrical, trachea midline and thyroid normal to inspection and palpation Lungs: clear to auscultation bilaterally Breasts: normal appearance, no masses or tenderness, No nipple retraction or dimpling, No nipple discharge or bleeding, No axillary adenopathy Heart: regular rate and rhythm Abdomen: soft, non-tender; no masses, no organomegaly Extremities: extremities normal, atraumatic, no cyanosis or edema Skin: skin color, texture, turgor normal. No rashes or lesions Lymph nodes: cervical, supraclavicular, and axillary nodes normal. Neurologic: grossly normal  Pelvic: External genitalia:  no lesions              No abnormal inguinal nodes palpated.              Urethra:  normal appearing urethra with no masses, tenderness or lesions              Bartholins and Skenes: normal                 Vagina: normal appearing vagina with normal color and discharge, no lesions              Cervix: no lesions              Pap taken: {yes no:314532} Bimanual Exam:  Uterus:  normal size, contour, position, consistency, mobility, non-tender              Adnexa: no mass, fullness, tenderness              Rectal exam: {yes no:314532}.  Confirms.              Anus:  normal sphincter tone, no lesions  Chaperone was present for exam:  ***  Assessment:   Well woman visit with gynecologic exam.   Plan: Mammogram screening discussed. Self breast awareness reviewed. Pap and HR HPV as above. Guidelines for Calcium, Vitamin D, regular exercise program including cardiovascular and weight bearing exercise.   Follow up annually and prn.   Additional counseling given.  {yes [id number]. _______ minutes face to face time of which over 50% was spent in counseling.    After visit summary provided.

## 2022-10-31 ENCOUNTER — Ambulatory Visit: Payer: Medicare PPO

## 2022-11-01 ENCOUNTER — Ambulatory Visit: Payer: Medicare PPO

## 2022-11-07 ENCOUNTER — Ambulatory Visit: Payer: Medicare PPO | Admitting: Obstetrics and Gynecology

## 2022-11-10 ENCOUNTER — Ambulatory Visit: Payer: Medicare PPO

## 2022-11-24 ENCOUNTER — Ambulatory Visit: Payer: Medicare PPO | Admitting: Neurology

## 2022-11-24 DIAGNOSIS — G43709 Chronic migraine without aura, not intractable, without status migrainosus: Secondary | ICD-10-CM | POA: Diagnosis not present

## 2022-11-24 MED ORDER — ONABOTULINUMTOXINA 100 UNITS IJ SOLR
200.0000 [IU] | Freq: Once | INTRAMUSCULAR | Status: AC
  Administered 2022-11-24: 155 [IU] via INTRAMUSCULAR

## 2022-11-24 NOTE — Progress Notes (Signed)
[facility]  ° °Procedure Note Botox ° °Attending: Dr. Anahita Cua ° °Preoperative Diagnosis(es): Chronic migraine ° °Consent obtained from: The patient °Benefits discussed included, but were not limited to decreased muscle tightness, increased joint range of motion, and decreased pain.  Risk discussed included, but were not limited pain and discomfort, bleeding, bruising, excessive weakness, venous thrombosis, muscle atrophy and dysphagia.  Anticipated outcomes of the procedure as well as he risks and benefits of the alternatives to the procedure, and the roles and tasks of the personnel to be involved, were discussed with the patient, and the patient consents to the procedure and agrees to proceed. A copy of the patient medication guide was given to the patient which explains the blackbox warning. ° °Patients identity and treatment sites confirmed Yes.  . ° °Details of Procedure: °Skin was cleaned with alcohol. Prior to injection, the needle plunger was aspirated to make sure the needle was not within a blood vessel.  There was no blood retrieved on aspiration.   ° °Following is a summary of the muscles injected  And the amount of Botulinum toxin used: ° °Dilution °200 units of Botox was reconstituted with 4 ml of preservative free normal saline. °Time of reconstitution: At the time of the office visit (<30 minutes prior to injection)  ° °Injections  °155 total units of Botox was injected with a 30 gauge needle. ° °Injection Sites: °L occipitalis: 15 units- 3 sites  °R occiptalis: 15 units- 3 sites ° °L upper trapezius: 15 units- 3 sites °R upper trapezius: 15 units- 3 sits          °L paraspinal: 10 units- 2 sites °R paraspinal: 10 units- 2 sites ° °Face °L frontalis(2 injection sites):10 units   °R frontalis(2 injection sites):10 units         °L corrugator: 5 units   °R corrugator: 5 units           °Procerus: 5 units   °L temporalis: 20 units °R temporalis: 20 units  ° °Agent:  °200 units of botulinum Type  A (Onobotulinum Toxin type A) was reconstituted with 4 ml of preservative free normal saline.  °Time of reconstitution: At the time of the office visit (<30 minutes prior to injection)  ° ° ° Total injected (Units):  155 ° Total wasted (Units):  45 ° °Patient tolerated procedure well without complications.   °Reinjection is anticipated in 3 months. ° ° °

## 2022-11-30 ENCOUNTER — Other Ambulatory Visit: Payer: Self-pay | Admitting: Neurology

## 2022-12-04 DIAGNOSIS — M5136 Other intervertebral disc degeneration, lumbar region: Secondary | ICD-10-CM | POA: Diagnosis not present

## 2022-12-04 DIAGNOSIS — Z79891 Long term (current) use of opiate analgesic: Secondary | ICD-10-CM | POA: Diagnosis not present

## 2022-12-04 DIAGNOSIS — M47816 Spondylosis without myelopathy or radiculopathy, lumbar region: Secondary | ICD-10-CM | POA: Diagnosis not present

## 2023-01-11 DIAGNOSIS — M170 Bilateral primary osteoarthritis of knee: Secondary | ICD-10-CM | POA: Diagnosis not present

## 2023-01-31 DIAGNOSIS — M170 Bilateral primary osteoarthritis of knee: Secondary | ICD-10-CM | POA: Diagnosis not present

## 2023-02-07 DIAGNOSIS — M170 Bilateral primary osteoarthritis of knee: Secondary | ICD-10-CM | POA: Diagnosis not present

## 2023-02-20 DIAGNOSIS — M170 Bilateral primary osteoarthritis of knee: Secondary | ICD-10-CM | POA: Diagnosis not present

## 2023-02-20 DIAGNOSIS — M25561 Pain in right knee: Secondary | ICD-10-CM | POA: Diagnosis not present

## 2023-02-20 DIAGNOSIS — M25562 Pain in left knee: Secondary | ICD-10-CM | POA: Diagnosis not present

## 2023-02-23 ENCOUNTER — Ambulatory Visit (INDEPENDENT_AMBULATORY_CARE_PROVIDER_SITE_OTHER): Payer: Medicare PPO | Admitting: Neurology

## 2023-02-23 DIAGNOSIS — G43709 Chronic migraine without aura, not intractable, without status migrainosus: Secondary | ICD-10-CM

## 2023-02-23 MED ORDER — ONABOTULINUMTOXINA 100 UNITS IJ SOLR
200.0000 [IU] | Freq: Once | INTRAMUSCULAR | Status: AC
  Administered 2023-02-23: 155 [IU] via INTRAMUSCULAR

## 2023-02-23 NOTE — Progress Notes (Signed)

## 2023-02-28 ENCOUNTER — Ambulatory Visit: Payer: Medicare PPO | Admitting: Neurology

## 2023-03-08 ENCOUNTER — Ambulatory Visit: Payer: Medicare PPO | Admitting: Neurology

## 2023-04-04 ENCOUNTER — Telehealth: Payer: Self-pay | Admitting: Neurology

## 2023-04-04 ENCOUNTER — Ambulatory Visit: Payer: Medicare PPO

## 2023-04-04 DIAGNOSIS — G43709 Chronic migraine without aura, not intractable, without status migrainosus: Secondary | ICD-10-CM

## 2023-04-04 MED ORDER — DIPHENHYDRAMINE HCL 50 MG/ML IJ SOLN
50.0000 mg | Freq: Once | INTRAMUSCULAR | Status: AC
  Administered 2023-04-04: 50 mg via INTRAMUSCULAR

## 2023-04-04 MED ORDER — DEXAMETHASONE SODIUM PHOSPHATE 10 MG/ML IJ SOLN
10.0000 mg | Freq: Once | INTRAMUSCULAR | Status: AC
  Administered 2023-04-04: 10 mg via INTRAMUSCULAR

## 2023-04-04 MED ORDER — METOCLOPRAMIDE HCL 5 MG/ML IJ SOLN
10.0000 mg | Freq: Once | INTRAMUSCULAR | Status: AC
  Administered 2023-04-04: 10 mg via INTRAMUSCULAR

## 2023-04-04 NOTE — Telephone Encounter (Signed)
Patient states the cocktail that she came into the office today to get, has not started working and would like a call back from nurse

## 2023-04-04 NOTE — Progress Notes (Signed)
Per chart no NSAIDS.  Patient given Dexamethazone at her last headache cocktail and tolerated well.   Patient the DEX today as well and tolerated.

## 2023-04-04 NOTE — Telephone Encounter (Signed)
Pt is calling in wanting to see if she can come in today to get a cocktail due to her having a real bad migraine.  Pt would like to have a call back.

## 2023-04-05 MED ORDER — PREDNISONE 10 MG (21) PO TBPK: ORAL_TABLET | ORAL | 0 refills | Status: DC

## 2023-04-10 ENCOUNTER — Telehealth: Payer: Self-pay | Admitting: Neurology

## 2023-04-10 NOTE — Telephone Encounter (Signed)
Patient called after hours line, that she has had a migraine for several days , she would like something for it, Doctor did send something but it did not work

## 2023-04-12 NOTE — Telephone Encounter (Signed)
Called patient and left a message for a call back.  

## 2023-04-17 NOTE — Telephone Encounter (Signed)
Called patient and informed her of Dr.Jaffe's advice:  "If the migraine cocktail here and prednisone taper did not break it, then there isn't anything I can offer to quickly abort the migraine.  She may need to go to the ED to receive IV medication.  She should probably make a follow up appointment to discuss further about any change in management."   Patient stated that the prednisone taper finally worked and help break her migraine. Patient stated that if it returns and she needs [place] she will contact us. Patient verbalized understanding and had no further questions or concerns.

## 2023-04-18 DIAGNOSIS — M47896 Other spondylosis, lumbar region: Secondary | ICD-10-CM | POA: Diagnosis not present

## 2023-04-18 DIAGNOSIS — Z79891 Long term (current) use of opiate analgesic: Secondary | ICD-10-CM | POA: Diagnosis not present

## 2023-04-18 DIAGNOSIS — M5136 Other intervertebral disc degeneration, lumbar region: Secondary | ICD-10-CM | POA: Diagnosis not present

## 2023-04-19 ENCOUNTER — Telehealth: Payer: Self-pay | Admitting: Neurology

## 2023-04-19 NOTE — Progress Notes (Signed)
Virtual Visit via Video Note  Consent was obtained for video visit:  Yes.   Answered questions that patient had about telehealth interaction:  Yes.   I discussed the limitations, risks, security and privacy concerns of performing an evaluation and management service by telemedicine. I also discussed with the patient that there may be a patient responsible charge related to this service. The patient expressed understanding and agreed to proceed.  Pt location: Home Physician Location: office Name of referring provider:  Mila Palmer, MD I connected with Annye Asa at patients initiation/request on 04/20/2023 at 10:30 AM EDT by video enabled telemedicine application and verified that I am speaking with the correct person using two identifiers. Pt MRN:  [id number] Pt DOB:  March 03, 1951 Video Participants:  Annye Asa  Assessment and Plan:   Migraine without aura, without status migrainosus, not intractable - she had an intractable migraine a couple of weeks ago.    Migraine prevention:  Botox, zonisamide 200mg  daily.  Hopefully this flare up was a fluke, so I wouldn't make any changes at this time.   Migraine rescue:  Instead of rizatriptan, she will try sumatriptan 50mg  tab.  Prefers tablet over injection.  Stop rizatriptan Limit use of pain relievers to no more than 2 days out of week to prevent risk of rebound or medication-overuse headache. Keep headache diary Follow up in 6 months.   History of Present Illness:  [name] is a 72 year old right-handed Caucasian woman with depression, anxiety and history of benzodiazepine and barbiturates dependence and gastric ulcer who follows up for migraines and concern about her memory.[name]   UPDATE: She had been doing well until late August when she had an increase frequency and severity in migraines.  She had an intractable migraine that did not respond to migraine cocktail but did respond to the prednisone taper.   Rizatriptan helped but did not completely abort it.  She didn't try the sumatriptan [place] because she thought it was denied.  However, we got message from her insurance that it was approved.    Current NSAIDS:  contraindicated Current analgesics:  Tramadol; Tylenol Current triptans:  rizatriptan 10mg  Current ergotamine:  no Current anti-emetic:  Promethazine 25mg -50mg  Current muscle relaxants:  no Current Antihypertensive medications:  no Current Antidepressant medications:  no Current Anticonvulsant medications:  zonisamide 200mg  daily Current anti-CGRP:  none Current Vitamins/Herbal/Supplements:  D Current Antihistamines/Decongestants:  no Other therapy:   Botox (since 01/03/2019)     She has had problems with short term memory since around 2021-2022, which has been getting worse over the past year.  She needs to keep sticky notes as reminders. Needs to have all appointments in her phone.  She does have increased emotional stress related.  Her daughter is going through a divorce.  Several years ago, she had an elevated TSH but further workup was negative.  She is concerned because her sister had Alzheimer's disease.     Caffeine:  1 cup 1/2 decaf coffee daily Alcohol:  no Smoker:  no Diet:  hydrates Exercise:  Stopped due to torn knee meniscus Depression:  no; Anxiety:  yes. Other pain:  back pain Sleep hygiene:  poor   HISTORY:  Onset: 2016.  She has remote history of migraines which were controlled for many years.  In December 2015, her husband unexpectedly passed away, which triggered depression and anxiety.  She subsequently recovered.  However, in early June 2016, she witnessed her boyfriend's ill father pass away.  It triggered flashbacks  of her husband's death, which increased depression, anxiety and migraines.  She has significant history of side effects to multiple medications. Location:  Left peri-orbital Quality:  pounding Initial Intensity:  10/10 Aura:  no Prodrome:   no Associated symptoms: Nausea, photophobia, phonophobia, osmophobia Initial Duration:  All day Initial Frequency:  15 days per month Triggers: Emotional stress Relieving factors: Tramadol Activity:  aggravates   Past NSAIDS:  Oral NSAIDs contraindicated due to bleeding ulcer Past analgesics:  Cannot take ASA products such as Excedrin due to bleeding ulcer Past abortive triptans:  Eletriptan (took it multiple times but caused chest tightness once, but was effective), sumatriptan Past anti-emetic:  Zofran 8mg  (effective) Past anti-anxiolytic:  benzodiazepines Past antihypertensive medications:  propranolol (chest tightness) Past antidepressant medications:  Multiple, such as Cymbalta.  She has had intolerance to multiple antidepressants over the years. Past anticonvulsant medications:  topiramate (chest tightness); Depakote ER 250mg  daily (higher doses caused increased hunger) Past anti-CGRP:  Aimovig (constipation, nausea), Bernita Raisin Past vitamins/Herbal/Supplements:  no   Family history of headache:  Mom, sister   MRI and MRA of head from 11/05/10 were personally reviewed and were unremarkable. Due to persistent headache, she had another MRI of brain with and without contrast on 11/17/2020 which showed possible left paranasal sinusitis but nothing concerning.  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 pylori infection    History of blood transfusion 1984   Hx of colonic polyps    Hypercholesteremia    Hyponatremia    Migraines    Vitamin D deficiency     Medications: Outpatient Encounter Medications as of 04/20/2023  Medication Sig   botulinum toxin Type A (BOTOX) 200 units injection Inject 155 units IM into Multiple sites of the face neck and head every 90 days.   Calcium 600-200 MG-UNIT tablet Take 1 tablet by mouth  daily.   docusate sodium (COLACE) 100 MG capsule Take 100 mg by mouth at bedtime.    OnabotulinumtoxinA (BOTOX IJ)    polyethylene glycol (MIRALAX / GLYCOLAX) packet Take 17 g by mouth at bedtime.   predniSONE (STERAPRED UNI-PAK 21 TAB) 10 MG (21) TBPK tablet take 60mg  day 1, then 50mg  day 2, then 40mg  day 3, then 30mg  day 4, then 20mg  day 5, then 10mg  day 6, then STOP   promethazine (PHENERGAN) 25 MG tablet Take 1 to 2 tablets every 6 hours as needed for nausea.   rizatriptan (MAXALT-MLT) 10 MG disintegrating tablet Take 1 tablet (10 mg total) by mouth as needed for migraine. May repeat in 2 hours if needed   Simethicone (PHAZYME MAXIMUM STRENGTH) 250 MG CAPS Take 250 mg by mouth daily as needed (gas).   SUMAtriptan Succinate 3 MG/0.5ML SOAJ Inject 3 mg into the skin as needed. May repeat after 1 hour.  Maximum 2 injections in 24 hours.   traMADol (ULTRAM) 50 MG tablet Take 1 tablet by mouth 4 (four) times daily as needed.   XIIDRA 5 % SOLN Place 1 drop into both eyes 2 (two) times daily.   zonisamide (ZONEGRAN) 100 MG capsule TAKE TWO CAPSULES BY MOUTH DAILY   No facility-administered encounter medications on file as of 04/20/2023.    Allergies: Allergies  Allergen Reactions   Aspirin Other (See Comments)    Hx of bleeding stomach ulcer  Crestor [Rosuvastatin Calcium] Itching   Inderal [Propranolol] Other (See Comments)    Chest tightness   Macrobid [Nitrofurantoin Monohyd Macro] Diarrhea   Other     Other reaction(s): Other (See Comments) Cholesterol meds-multiple reactions to multiple meds   Relpax [Eletriptan Hydrobromide] Other (See Comments)    Chest Tightness    Salicylates Other (See Comments)    Told not to take due to hx of GI ulcer   Topamax [Topiramate] Other (See Comments)    Chest tightness    Family History: Family History  Problem Relation Age of Onset   Migraines Mother    Heart attack Mother    Migraines Sister    Cancer Father        Prostate     Observations/Objective:   No acute distress.  Alert and oriented.  Speech fluent and not dysarthric.  Language intact.  Eyes orthophoric on primary gaze.  Face symmetric.   Follow Up Instructions:    -I discussed the assessment and treatment plan with the patient. The patient was provided an opportunity to ask questions and all were answered. The patient agreed with the plan and demonstrated an understanding of the instructions.   The patient was advised to call back or seek an in-person evaluation if the symptoms worsen or if the condition fails to improve as anticipated.   Cira Servant, DO

## 2023-04-19 NOTE — Telephone Encounter (Signed)
Patient said she needs some medication to take at home for her migraines. She takes botox but needs something for in between. She took an appt after speak with her. She is scheduled for tomorrow.

## 2023-04-20 ENCOUNTER — Telehealth (INDEPENDENT_AMBULATORY_CARE_PROVIDER_SITE_OTHER): Payer: Medicare PPO | Admitting: Neurology

## 2023-04-20 ENCOUNTER — Encounter: Payer: Self-pay | Admitting: Neurology

## 2023-04-20 DIAGNOSIS — G43009 Migraine without aura, not intractable, without status migrainosus: Secondary | ICD-10-CM | POA: Diagnosis not present

## 2023-04-20 DIAGNOSIS — G43109 Migraine with aura, not intractable, without status migrainosus: Secondary | ICD-10-CM

## 2023-04-20 MED ORDER — SUMATRIPTAN SUCCINATE 50 MG PO TABS: 50.0000 mg | ORAL_TABLET | ORAL | 5 refills | Status: AC | PRN

## 2023-04-26 ENCOUNTER — Telehealth: Payer: Self-pay | Admitting: Neurology

## 2023-04-26 NOTE — Telephone Encounter (Signed)
Pt called in and left a message with the access nurse. She stated she has ordered her botox and will arrive within 1-2 weeks.

## 2023-05-02 DIAGNOSIS — R7301 Impaired fasting glucose: Secondary | ICD-10-CM | POA: Diagnosis not present

## 2023-05-02 DIAGNOSIS — E7800 Pure hypercholesterolemia, unspecified: Secondary | ICD-10-CM | POA: Diagnosis not present

## 2023-05-02 DIAGNOSIS — Z79899 Other long term (current) drug therapy: Secondary | ICD-10-CM | POA: Diagnosis not present

## 2023-05-02 DIAGNOSIS — R946 Abnormal results of thyroid function studies: Secondary | ICD-10-CM | POA: Diagnosis not present

## 2023-05-02 DIAGNOSIS — M810 Age-related osteoporosis without current pathological fracture: Secondary | ICD-10-CM | POA: Diagnosis not present

## 2023-05-02 DIAGNOSIS — F325 Major depressive disorder, single episode, in full remission: Secondary | ICD-10-CM | POA: Diagnosis not present

## 2023-05-02 DIAGNOSIS — Z0000 Encounter for general adult medical examination without abnormal findings: Secondary | ICD-10-CM | POA: Diagnosis not present

## 2023-05-02 DIAGNOSIS — E559 Vitamin D deficiency, unspecified: Secondary | ICD-10-CM | POA: Diagnosis not present

## 2023-05-04 NOTE — Telephone Encounter (Signed)
It has been > 1 year since pt last PROLIA inj.  Pt archived in [email or web address].  Please advise if patient and/or provider wish to proceed with Prolia therpay.   If you would like for pt to continue with Prolia therapy, please have clinical staff reach out to pt for scheduling and to explain to importance of receiving Prolia injections every 6 months as abrupt cessation of Prolia raises risk of osteoporotic fracture.    "Discontinuation of Dmab is associated with a 3- to 5-fold higher risk for vertebral, major osteoporotic, and hip fractures [38,39]."   [email or web address]

## 2023-05-08 NOTE — Telephone Encounter (Signed)
[name], This pt. was not seen by me since 2021.  I will not see her back, as she canceled multiple appointments.  If it has been 1 year since her last Prolia injection, further management should be per PCP. Thank you! CG

## 2023-05-17 DIAGNOSIS — R946 Abnormal results of thyroid function studies: Secondary | ICD-10-CM | POA: Diagnosis not present

## 2023-05-20 DIAGNOSIS — N3001 Acute cystitis with hematuria: Secondary | ICD-10-CM | POA: Diagnosis not present

## 2023-05-20 DIAGNOSIS — R3915 Urgency of urination: Secondary | ICD-10-CM | POA: Diagnosis not present

## 2023-05-25 ENCOUNTER — Ambulatory Visit: Payer: Medicare PPO | Admitting: Neurology

## 2023-05-25 DIAGNOSIS — G43709 Chronic migraine without aura, not intractable, without status migrainosus: Secondary | ICD-10-CM

## 2023-05-25 MED ORDER — ONABOTULINUMTOXINA 100 UNITS IJ SOLR
200.0000 [IU] | Freq: Once | INTRAMUSCULAR | Status: AC
  Administered 2023-05-25: 155 [IU] via INTRAMUSCULAR

## 2023-05-25 NOTE — Progress Notes (Signed)
[facility]um Clinic  ° °Procedure Note Botox ° °Attending: Dr. Mardella Nuckles ° °Preoperative Diagnosis(es): Chronic migraine ° °Consent obtained from: The patient °Benefits discussed included, but were not limited to decreased muscle tightness, increased joint range of motion, and decreased pain.  Risk discussed included, but were not limited pain and discomfort, bleeding, bruising, excessive weakness, venous thrombosis, muscle atrophy and dysphagia.  Anticipated outcomes of the procedure as well as he risks and benefits of the alternatives to the procedure, and the roles and tasks of the personnel to be involved, were discussed with the patient, and the patient consents to the procedure and agrees to proceed. A copy of the patient medication guide was given to the patient which explains the blackbox warning. ° °Patients identity and treatment sites confirmed Yes.  . ° °Details of Procedure: °Skin was cleaned with alcohol. Prior to injection, the needle plunger was aspirated to make sure the needle was not within a blood vessel.  There was no blood retrieved on aspiration.   ° °Following is a summary of the muscles injected  And the amount of Botulinum toxin used: ° °Dilution °200 units of Botox was reconstituted with 4 ml of preservative free normal saline. °Time of reconstitution: At the time of the office visit (<30 minutes prior to injection)  ° °Injections  °155 total units of Botox was injected with a 30 gauge needle. ° °Injection Sites: °L occipitalis: 15 units- 3 sites  °R occiptalis: 15 units- 3 sites ° °L upper trapezius: 15 units- 3 sites °R upper trapezius: 15 units- 3 sits          °L paraspinal: 10 units- 2 sites °R paraspinal: 10 units- 2 sites ° °Face °L frontalis(2 injection sites):10 units   °R frontalis(2 injection sites):10 units         °L corrugator: 5 units   °R corrugator: 5 units           °Procerus: 5 units   °L temporalis: 20 units °R temporalis: 20 units  ° °Agent:  °200 units of botulinum Type  A (Onobotulinum Toxin type A) was reconstituted with 4 ml of preservative free normal saline.  °Time of reconstitution: At the time of the office visit (<30 minutes prior to injection)  ° ° ° Total injected (Units):  155 ° Total wasted (Units):  45 ° °Patient tolerated procedure well without complications.   °Reinjection is anticipated in 3 months. ° ° °

## 2023-05-29 DIAGNOSIS — N3000 Acute cystitis without hematuria: Secondary | ICD-10-CM | POA: Diagnosis not present

## 2023-05-30 ENCOUNTER — Other Ambulatory Visit: Payer: Self-pay | Admitting: Neurology

## 2023-06-04 ENCOUNTER — Ambulatory Visit: Payer: Self-pay

## 2023-06-04 ENCOUNTER — Institutional Professional Consult (permissible substitution): Payer: Medicare PPO | Admitting: Psychology

## 2023-06-11 ENCOUNTER — Encounter: Payer: Medicare PPO | Admitting: Psychology

## 2023-06-14 DIAGNOSIS — H2513 Age-related nuclear cataract, bilateral: Secondary | ICD-10-CM | POA: Diagnosis not present

## 2023-06-14 DIAGNOSIS — H5213 Myopia, bilateral: Secondary | ICD-10-CM | POA: Diagnosis not present

## 2023-06-14 DIAGNOSIS — H524 Presbyopia: Secondary | ICD-10-CM | POA: Diagnosis not present

## 2023-06-14 DIAGNOSIS — H04123 Dry eye syndrome of bilateral lacrimal glands: Secondary | ICD-10-CM | POA: Diagnosis not present

## 2023-07-23 ENCOUNTER — Other Ambulatory Visit: Payer: Self-pay | Admitting: Neurology

## 2023-07-23 DIAGNOSIS — G43709 Chronic migraine without aura, not intractable, without status migrainosus: Secondary | ICD-10-CM

## 2023-08-07 ENCOUNTER — Other Ambulatory Visit (HOSPITAL_COMMUNITY): Payer: Self-pay

## 2023-08-07 ENCOUNTER — Telehealth: Payer: Self-pay

## 2023-08-07 NOTE — Telephone Encounter (Signed)
PA needed for Botox 

## 2023-08-07 NOTE — Telephone Encounter (Signed)
 Test claim shows that this was processed 07-25-2023 and cannot be filled again until 10-08-2023

## 2023-08-09 NOTE — Telephone Encounter (Signed)
 Per Rep Botox hasn't been shipped out, Patient called to try and have it delivered but it was too soon.  [facility] will call patient to try and have it shipped.   I went on and gave permission to ship to [place].

## 2023-08-09 NOTE — Telephone Encounter (Signed)
 Patient wants to know if we got her botox, called patient to let her know what sheena message said

## 2023-08-09 NOTE — Telephone Encounter (Signed)
 Patient advised of my note below.

## 2023-08-15 DIAGNOSIS — Z5181 Encounter for therapeutic drug level monitoring: Secondary | ICD-10-CM | POA: Diagnosis not present

## 2023-08-15 DIAGNOSIS — M51362 Other intervertebral disc degeneration, lumbar region with discogenic back pain and lower extremity pain: Secondary | ICD-10-CM | POA: Diagnosis not present

## 2023-08-15 DIAGNOSIS — M47896 Other spondylosis, lumbar region: Secondary | ICD-10-CM | POA: Diagnosis not present

## 2023-08-15 DIAGNOSIS — Z79899 Other long term (current) drug therapy: Secondary | ICD-10-CM | POA: Diagnosis not present

## 2023-08-15 DIAGNOSIS — Z79891 Long term (current) use of opiate analgesic: Secondary | ICD-10-CM | POA: Diagnosis not present

## 2023-08-19 ENCOUNTER — Telehealth: Payer: Self-pay | Admitting: Pharmacy Technician

## 2023-08-19 ENCOUNTER — Other Ambulatory Visit (HOSPITAL_COMMUNITY): Payer: Self-pay

## 2023-08-19 DIAGNOSIS — G43709 Chronic migraine without aura, not intractable, without status migrainosus: Secondary | ICD-10-CM

## 2023-08-19 NOTE — Telephone Encounter (Signed)
 Pharmacy Patient Advocate Encounter  [employer] verification has been submitted. Benefit Verification #:  [id number]  Pharmacy PA has been submitted for BOTOX  200u via CoverMyMeds. INSURANCE: [employer] DATE SUBMITTED: 1.12.25 KEY: [id number] Status is pending

## 2023-08-20 ENCOUNTER — Telehealth: Payer: Self-pay | Admitting: Neurology

## 2023-08-20 NOTE — Telephone Encounter (Signed)
 Pt is calling about PA for her medication. States the pharmacy contacted her

## 2023-08-20 NOTE — Telephone Encounter (Signed)
 Patient advised of PA pending. We will move the appt if needed.

## 2023-08-21 ENCOUNTER — Telehealth: Payer: Self-pay | Admitting: Neurology

## 2023-08-21 ENCOUNTER — Other Ambulatory Visit (HOSPITAL_COMMUNITY): Payer: Self-pay

## 2023-08-21 ENCOUNTER — Other Ambulatory Visit: Payer: Self-pay

## 2023-08-21 ENCOUNTER — Other Ambulatory Visit (HOSPITAL_COMMUNITY): Payer: Self-pay | Admitting: Pharmacy Technician

## 2023-08-21 MED ORDER — BOTOX 200 UNITS IJ SOLR
INTRAMUSCULAR | 4 refills | Status: DC
  Filled 2023-08-21: qty 1, fill #0
  Filled 2023-08-22: qty 1, 90d supply, fill #0
  Filled 2023-11-16: qty 1, 90d supply, fill #1
  Filled 2024-02-18: qty 1, 90d supply, fill #2
  Filled 2024-05-26 – 2024-05-28 (×2): qty 1, 90d supply, fill #3
  Filled 2024-08-19: qty 1, 90d supply, fill #4

## 2023-08-21 NOTE — Telephone Encounter (Signed)
 Pharmacy Patient Advocate Encounter- Botox  BIV-Pharmacy Benefit:  PA was submitted to [employer] and has been approved through: 1.1.25 - 12.31.25 Authorization# [id number]  Please send prescription to Specialty Pharmacy: [facility]: [phone]  Estimated Copay is: 100  Patient IS NOT eligible for Botox  Copay Card, which will make patient's copay as little as zero. Copay card will be provided to pharmacy.

## 2023-08-21 NOTE — Telephone Encounter (Signed)
 Pt states she missed a call from sheena about her injections.

## 2023-08-21 NOTE — Telephone Encounter (Signed)
 Patient advised Via mychart and phone call. Script sent to [facility].

## 2023-08-22 ENCOUNTER — Other Ambulatory Visit (HOSPITAL_COMMUNITY): Payer: Self-pay

## 2023-08-22 ENCOUNTER — Other Ambulatory Visit: Payer: Self-pay

## 2023-08-22 NOTE — Telephone Encounter (Signed)
 Cancelled Botox  script with [facility].

## 2023-08-22 NOTE — Progress Notes (Signed)
 I have attempted to contact this patient by phone with the following results: left message to return my call on answering machine.

## 2023-08-23 ENCOUNTER — Other Ambulatory Visit: Payer: Self-pay

## 2023-08-23 ENCOUNTER — Other Ambulatory Visit (HOSPITAL_COMMUNITY): Payer: Self-pay

## 2023-08-23 NOTE — Progress Notes (Signed)
 Specialty Pharmacy Initial Fill Coordination Note  [name] is a 73 y.o. female contacted today regarding initial fill of specialty medication(s) OnabotulinumtoxinA  (Botox )   Patient requested Courier to Provider Office   Delivery date: 08/24/23   Verified address: [facility] [address], [place], [place], [address]   Medication will be filled on 08/22/23.   Patient is aware of 100 copayment.

## 2023-08-24 ENCOUNTER — Other Ambulatory Visit: Payer: Self-pay

## 2023-08-24 ENCOUNTER — Ambulatory Visit: Payer: Medicare PPO | Admitting: Neurology

## 2023-08-24 DIAGNOSIS — G43109 Migraine with aura, not intractable, without status migrainosus: Secondary | ICD-10-CM

## 2023-08-24 MED ORDER — ONABOTULINUMTOXINA 100 UNITS IJ SOLR
200.0000 [IU] | Freq: Once | INTRAMUSCULAR | Status: AC
  Administered 2023-08-24: 155 [IU] via INTRAMUSCULAR

## 2023-08-24 NOTE — Progress Notes (Signed)
Botulinum Clinic  ° °Procedure Note Botox ° °Attending: Dr.   ° °Preoperative Diagnosis(es): Chronic migraine ° °Consent obtained from: The patient °Benefits discussed included, but were not limited to decreased muscle tightness, increased joint range of motion, and decreased pain.  Risk discussed included, but were not limited pain and discomfort, bleeding, bruising, excessive weakness, venous thrombosis, muscle atrophy and dysphagia.  Anticipated outcomes of the procedure as well as he risks and benefits of the alternatives to the procedure, and the roles and tasks of the personnel to be involved, were discussed with the patient, and the patient consents to the procedure and agrees to proceed. A copy of the patient medication guide was given to the patient which explains the blackbox warning. ° °Patients identity and treatment sites confirmed Yes.  . ° °Details of Procedure: °Skin was cleaned with alcohol. Prior to injection, the needle plunger was aspirated to make sure the needle was not within a blood vessel.  There was no blood retrieved on aspiration.   ° °Following is a summary of the muscles injected  And the amount of Botulinum toxin used: ° °Dilution °200 units of Botox was reconstituted with 4 ml of preservative free normal saline. °Time of reconstitution: At the time of the office visit (<30 minutes prior to injection)  ° °Injections  °155 total units of Botox was injected with a 30 gauge needle. ° °Injection Sites: °L occipitalis: 15 units- 3 sites  °R occiptalis: 15 units- 3 sites ° °L upper trapezius: 15 units- 3 sites °R upper trapezius: 15 units- 3 sits          °L paraspinal: 10 units- 2 sites °R paraspinal: 10 units- 2 sites ° °Face °L frontalis(2 injection sites):10 units   °R frontalis(2 injection sites):10 units         °L corrugator: 5 units   °R corrugator: 5 units           °Procerus: 5 units   °L temporalis: 20 units °R temporalis: 20 units  ° °Agent:  °200 units of botulinum Type  A (Onobotulinum Toxin type A) was reconstituted with 4 ml of preservative free normal saline.  °Time of reconstitution: At the time of the office visit (<30 minutes prior to injection)  ° ° ° Total injected (Units):  155 ° Total wasted (Units):  45 ° °Patient tolerated procedure well without complications.   °Reinjection is anticipated in 3 months. ° ° °

## 2023-09-19 DIAGNOSIS — M170 Bilateral primary osteoarthritis of knee: Secondary | ICD-10-CM | POA: Diagnosis not present

## 2023-09-25 DIAGNOSIS — M170 Bilateral primary osteoarthritis of knee: Secondary | ICD-10-CM | POA: Diagnosis not present

## 2023-10-03 DIAGNOSIS — M170 Bilateral primary osteoarthritis of knee: Secondary | ICD-10-CM | POA: Diagnosis not present

## 2023-10-18 ENCOUNTER — Telehealth: Payer: Self-pay | Admitting: Neurology

## 2023-10-18 NOTE — Telephone Encounter (Signed)
 Pt called in wanting to make sure she still had an appointment on 10/22/23. I confirmed her appointment.

## 2023-10-19 NOTE — Progress Notes (Signed)
 NEUROLOGY FOLLOW UP OFFICE NOTE  [name] [id number]  Assessment/Plan:   Migraine without aura, without status migrainosus, not intractable Memory deficits   Migraine prevention:  Botox, zonisamide 200mg  daily.   Migraine rescue:  sumatriptan 50mg  tab.  Prefers tablet over injection.   Limit use of pain relievers to no more than 2 days out of week to prevent risk of rebound or medication-overuse headache. Keep headache diary Recommended ordering neuropsychological evaluation.  Declines now.  Will contact us if she changes her mind. Follow up in 6 months.  Subjective:  [name] is a 73 year old right-handed Caucasian woman with depression, anxiety and history of benzodiazepine and barbiturates dependence and gastric ulcer who follows up for migraines and concern about her memory.[name]   UPDATE: May get caffeine withdrawal headaches but only one migraine over the past 3 months.  Aborts in 1-2 hours with sumatriptan.   Current NSAIDS:  contraindicated Current analgesics:  Tramadol; Tylenol Current triptans: sumatriptan 50mg  Current ergotamine:  no Current anti-emetic:  Promethazine 25mg -50mg  Current muscle relaxants:  no Current Antihypertensive medications:  no Current Antidepressant medications:  no Current Anticonvulsant medications:  zonisamide 200mg  daily Current anti-CGRP:  none Current Vitamins/Herbal/Supplements:  D Current Antihistamines/Decongestants:  no Other therapy:   Botox (since 01/03/2019)     Currently not concerned about her memory.  Her ex-husband is concerned.   Caffeine:  1 cup 1/2 decaf coffee daily Alcohol:  no Smoker:  no Diet:  hydrates Exercise:  Stopped due to torn knee meniscus Depression:  no; Anxiety:  yes. Other pain:  back pain Sleep hygiene:  poor   HISTORY: Migraines:  Onset: 2016.  She has remote history of migraines which were controlled for many years.  In December 2015, her husband unexpectedly passed away,  which triggered depression and anxiety.  She subsequently recovered.  However, in early June 2016, she witnessed her boyfriend's ill father pass away.  It triggered flashbacks of her husband's death, which increased depression, anxiety and migraines.  She has significant history of side effects to multiple medications. Location:  Left peri-orbital Quality:  pounding Initial Intensity:  10/10 Aura:  no Prodrome:  no Associated symptoms: Nausea, photophobia, phonophobia, osmophobia Initial Duration:  All day Initial Frequency:  15 days per month Triggers: Emotional stress Relieving factors: Tramadol Activity:  aggravates   Past NSAIDS:  Oral NSAIDs contraindicated due to bleeding ulcer Past analgesics:  Cannot take ASA products such as Excedrin due to bleeding ulcer Past abortive triptans:  Eletriptan (took it multiple times but caused chest tightness once, but was effective), rizatriptan 10mg  Past anti-emetic:  Zofran 8mg  (effective) Past anti-anxiolytic:  benzodiazepines Past antihypertensive medications:  propranolol (chest tightness) Past antidepressant medications:  Multiple, such as Cymbalta.  She has had intolerance to multiple antidepressants over the years. Past anticonvulsant medications:  topiramate (chest tightness); Depakote ER 250mg  daily (higher doses caused increased hunger) Past anti-CGRP:  Aimovig (constipation, nausea), Bernita Raisin Past vitamins/Herbal/Supplements:  no   Family history of headache:  Mom, sister   MRI and MRA of head from 11/05/10 were personally reviewed and were unremarkable. Due to persistent headache, she had another MRI of brain with and without contrast on 11/17/2020 which showed possible left paranasal sinusitis but nothing concerning.  Memory problems: She has had problems with short term memory since around 2021-2022, which has been getting worse over the past year.  She needs to keep sticky notes as reminders. Needs to have all appointments in her  phone.  She does have increased  emotional stress related.  Her daughter is going through a divorce.  Several years ago, she had an elevated TSH but further workup was negative.  She is concerned because her sister had Alzheimer's disease.    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 pylori infection    History of blood transfusion 1984   Hx of colonic polyps    Hypercholesteremia    Hyponatremia    Migraines    Vitamin D deficiency     MEDICATIONS: Current Outpatient Medications on File Prior to Visit  Medication Sig Dispense Refill   botulinum toxin Type A (BOTOX) 200 units injection INJECT 155 UNITS INTRAMUSCULARLY INTO MULTIPLE SITES OF FACE, NECK AND HEAD EVERY 90 DAYS 1 each 4   botulinum toxin Type A (BOTOX) 200 units injection Inject 155 units IM into multiple site in the face,neck and head once every 90 days 1 each 4   Calcium 600-200 MG-UNIT tablet Take 1 tablet by mouth daily.     docusate sodium (COLACE) 100 MG capsule Take 100 mg by mouth at bedtime.      polyethylene glycol (MIRALAX / GLYCOLAX) packet Take 17 g by mouth at bedtime.     predniSONE (STERAPRED UNI-PAK 21 TAB) 10 MG (21) TBPK tablet take 60mg  day 1, then 50mg  day 2, then 40mg  day 3, then 30mg  day 4, then 20mg  day 5, then 10mg  day 6, then STOP 21 tablet 0   promethazine (PHENERGAN) 25 MG tablet Take 1 to 2 tablets every 6 hours as needed for nausea. 30 tablet 5   Simethicone (PHAZYME MAXIMUM STRENGTH) 250 MG CAPS Take 250 mg by mouth daily as needed (gas).     SUMAtriptan (IMITREX) 50 MG tablet Take 1 tablet (50 mg total) by mouth as needed for migraine. May repeat in 2 hours if headache persists or recurs.  Maximum 2 tablets in 24 hours. 10 tablet 5   traMADol (ULTRAM) 50 MG tablet Take 1 tablet by mouth 4 (four) times daily as needed.     XIIDRA 5 %  SOLN Place 1 drop into both eyes 2 (two) times daily.     zonisamide (ZONEGRAN) 100 MG capsule TAKE TWO CAPSULES BY MOUTH DAILY 60 capsule 5   No current facility-administered medications on file prior to visit.    ALLERGIES: Allergies  Allergen Reactions   Aspirin Other (See Comments)    Hx of bleeding stomach ulcer    Crestor [Rosuvastatin Calcium] Itching   Inderal [Propranolol] Other (See Comments)    Chest tightness   Macrobid [Nitrofurantoin Monohyd Macro] Diarrhea   Other     Other reaction(s): Other (See Comments) Cholesterol meds-multiple reactions to multiple meds   Relpax [Eletriptan Hydrobromide] Other (See Comments)    Chest Tightness    Salicylates Other (See Comments)    Told not to take due to hx of GI ulcer   Topamax [Topiramate] Other (See Comments)    Chest tightness    FAMILY HISTORY: Family History  Problem Relation Age of Onset   Migraines Mother    Heart attack Mother    Migraines Sister    Cancer Father        Prostate      Objective:  Blood pressure (!) 131/58, pulse 85, height 5' (1.524 m), weight 143 lb (  64.9 kg), last menstrual period 08/07/2001, SpO2 98%. General: No acute distress.  Patient appears well-groomed.   Head:  Normocephalic/atraumatic Eyes:  Fundi examined but not visualized Neck: supple, no paraspinal tenderness, full range of motion Heart:  Regular rate and rhythm Lungs:  Clear to auscultation bilaterally Back: No paraspinal tenderness Neurological Exam: alert and oriented.  Speech fluent and not dysarthric, language intact.  CN II-XII intact. Bulk and tone normal, muscle strength 5/5 throughout.  Sensation to light touch intact.  Deep tendon reflexes 2+ throughout, toes downgoing.  Finger to nose testing intact.  Gait normal, Romberg negative.   Shon Millet, DO  CC: Mila Palmer, MD

## 2023-10-22 ENCOUNTER — Encounter: Payer: Self-pay | Admitting: Neurology

## 2023-10-22 ENCOUNTER — Ambulatory Visit: Payer: Medicare PPO | Admitting: Neurology

## 2023-10-22 VITALS — BP 131/58 | HR 85 | Ht 60.0 in | Wt 143.0 lb

## 2023-10-22 DIAGNOSIS — R413 Other amnesia: Secondary | ICD-10-CM | POA: Diagnosis not present

## 2023-10-22 DIAGNOSIS — G43109 Migraine with aura, not intractable, without status migrainosus: Secondary | ICD-10-CM | POA: Diagnosis not present

## 2023-10-22 NOTE — Patient Instructions (Addendum)
 Continue Botox (next session is on April 25) Take zonisamide 100mg  - 2 pills daily Sumatriptan as needed.   If you reconsider cognitive testing, contact me Follow up 6 months.

## 2023-11-06 ENCOUNTER — Other Ambulatory Visit: Payer: Self-pay

## 2023-11-13 ENCOUNTER — Other Ambulatory Visit: Payer: Self-pay

## 2023-11-16 ENCOUNTER — Other Ambulatory Visit: Payer: Self-pay

## 2023-11-16 NOTE — Progress Notes (Signed)
 Specialty Pharmacy Refill Coordination Note  [name] is a 73 y.o. female contacted today regarding refills of specialty medication(s) OnabotulinumtoxinA (Botox)   Patient requested Courier to Provider Office   Delivery date: 11/26/23   Verified address: [facility] [address], [place], [place], [address]   Medication will be filled on 11/23/23.

## 2023-11-19 ENCOUNTER — Other Ambulatory Visit: Payer: Self-pay

## 2023-11-22 ENCOUNTER — Other Ambulatory Visit (HOSPITAL_COMMUNITY): Payer: Self-pay

## 2023-11-23 ENCOUNTER — Other Ambulatory Visit: Payer: Self-pay

## 2023-11-30 ENCOUNTER — Ambulatory Visit: Payer: Medicare PPO | Admitting: Neurology

## 2023-11-30 DIAGNOSIS — G43709 Chronic migraine without aura, not intractable, without status migrainosus: Secondary | ICD-10-CM | POA: Diagnosis not present

## 2023-11-30 MED ORDER — ONABOTULINUMTOXINA 100 UNITS IJ SOLR
200.0000 [IU] | Freq: Once | INTRAMUSCULAR | Status: AC
  Administered 2023-11-30: 155 [IU] via INTRAMUSCULAR

## 2023-11-30 NOTE — Progress Notes (Signed)

## 2023-12-13 DIAGNOSIS — M545 Low back pain, unspecified: Secondary | ICD-10-CM | POA: Diagnosis not present

## 2023-12-13 DIAGNOSIS — Z79891 Long term (current) use of opiate analgesic: Secondary | ICD-10-CM | POA: Diagnosis not present

## 2023-12-13 DIAGNOSIS — Z79899 Other long term (current) drug therapy: Secondary | ICD-10-CM | POA: Diagnosis not present

## 2023-12-13 DIAGNOSIS — M47896 Other spondylosis, lumbar region: Secondary | ICD-10-CM | POA: Diagnosis not present

## 2023-12-13 DIAGNOSIS — M51362 Other intervertebral disc degeneration, lumbar region with discogenic back pain and lower extremity pain: Secondary | ICD-10-CM | POA: Diagnosis not present

## 2024-02-18 ENCOUNTER — Other Ambulatory Visit: Payer: Self-pay | Admitting: Pharmacy Technician

## 2024-02-18 ENCOUNTER — Other Ambulatory Visit: Payer: Self-pay

## 2024-02-18 NOTE — Progress Notes (Signed)
 Specialty Pharmacy Refill Coordination Note  [name] is a 73 y.o. female contacted today regarding refills of specialty medication(s) OnabotulinumtoxinA  (Botox )   Patient requested Courier to Provider Office   Delivery date: 02/25/24   Verified address: [facility] [address] [place]   Medication will be filled on 02/22/24.  Injection Appointment: 02/29/24.  Copay $100 & patient agreed

## 2024-02-19 ENCOUNTER — Other Ambulatory Visit: Payer: Self-pay

## 2024-02-21 ENCOUNTER — Other Ambulatory Visit: Payer: Self-pay

## 2024-02-22 ENCOUNTER — Other Ambulatory Visit (HOSPITAL_COMMUNITY): Payer: Self-pay

## 2024-02-25 ENCOUNTER — Other Ambulatory Visit: Payer: Self-pay

## 2024-02-26 ENCOUNTER — Telehealth: Payer: Self-pay

## 2024-02-26 NOTE — Telephone Encounter (Signed)
 LMOVM as well sent a mychart message on 7/21 please give the pharmacy a call in regards to her Botox .

## 2024-02-27 ENCOUNTER — Other Ambulatory Visit: Payer: Self-pay

## 2024-02-29 ENCOUNTER — Ambulatory Visit: Admitting: Neurology

## 2024-02-29 DIAGNOSIS — G43709 Chronic migraine without aura, not intractable, without status migrainosus: Secondary | ICD-10-CM | POA: Diagnosis not present

## 2024-02-29 MED ORDER — ONABOTULINUMTOXINA 100 UNITS IJ SOLR
200.0000 [IU] | Freq: Once | INTRAMUSCULAR | Status: AC
Start: 2024-02-29 — End: 2024-02-29
  Administered 2024-02-29: 155 [IU] via INTRAMUSCULAR

## 2024-02-29 NOTE — Progress Notes (Signed)

## 2024-03-12 ENCOUNTER — Other Ambulatory Visit (HOSPITAL_COMMUNITY): Payer: Self-pay

## 2024-04-24 NOTE — Progress Notes (Signed)
 NEUROLOGY FOLLOW UP OFFICE NOTE  [name] [id number]  Assessment/Plan:   Migraine without aura, without status migrainosus, not intractable   Migraine prevention:  Botox , zonisamide  200mg  daily.   Migraine rescue:  sumatriptan  50mg  tab.  Prefers tablet over injection.   Limit use of pain relievers to no more than 2 days out of week to prevent risk of rebound or medication-overuse headache. Keep headache diary Recommended ordering neuropsychological evaluation.  Declines now.  Will contact us  if she changes her mind. Follow up in 6 months.  Subjective:  [name] is a 73 year old right-handed Caucasian woman with depression, anxiety and history of benzodiazepine and barbiturates dependence and gastric ulcer who follows up for migraines and concern about her memory.[name]   UPDATE: Unchanged and stable.  May get caffeine withdrawal headaches but only one migraine 1 to 2 times a month.  Aborts in 1-2 hours with sumatriptan .   Current NSAIDS:  contraindicated Current analgesics:  Tramadol; Tylenol  Current triptans: sumatriptan  50mg  Current ergotamine:  no Current anti-emetic:  Promethazine  25mg -50mg  Current muscle relaxants:  no Current Antihypertensive medications:  no Current Antidepressant medications:  no Current Anticonvulsant medications:  zonisamide  200mg  daily Current anti-CGRP:  none Current Vitamins/Herbal/Supplements:  D Current Antihistamines/Decongestants:  no Other therapy:   Botox  (since 01/03/2019)    Caffeine:  1 cup 1/2 decaf coffee daily Alcohol :  no Smoker:  no Diet:  hydrates Exercise:  Stopped due to torn knee meniscus Depression:  no; Anxiety:  yes. Other pain:  back pain Sleep hygiene:  poor   HISTORY: Migraines:  Onset: 2016.  She has remote history of migraines which were controlled for many years.  In December 2015, her husband unexpectedly passed away, which triggered depression and anxiety.  She subsequently recovered.   However, in early June 2016, she witnessed her boyfriend's ill father pass away.  It triggered flashbacks of her husband's death, which increased depression, anxiety and migraines.  She has significant history of side effects to multiple medications. Location:  Left peri-orbital Quality:  pounding Initial Intensity:  10/10 Aura:  no Prodrome:  no Associated symptoms: Nausea, photophobia, phonophobia, osmophobia Initial Duration:  All day Initial Frequency:  15 days per month Triggers: Emotional stress Relieving factors: Tramadol Activity:  aggravates   Past NSAIDS:  Oral NSAIDs contraindicated due to bleeding ulcer Past analgesics:  Cannot take ASA products such as Excedrin due to bleeding ulcer Past abortive triptans:  Eletriptan (took it multiple times but caused chest tightness once, but was effective), rizatriptan  10mg  Past anti-emetic:  Zofran  8mg  (effective) Past anti-anxiolytic:  benzodiazepines Past antihypertensive medications:  propranolol (chest tightness) Past antidepressant medications:  Multiple, such as Cymbalta.  She has had intolerance to multiple antidepressants over the years. Past anticonvulsant medications:  topiramate (chest tightness); Depakote  ER 250mg  daily (higher doses caused increased hunger) Past anti-CGRP:  Aimovig  (constipation, nausea), Ubrelvy  Past vitamins/Herbal/Supplements:  no   Family history of headache:  Mom, sister   MRI and MRA of head from 11/05/10 were personally reviewed and were unremarkable. Due to persistent headache, she had another MRI of brain with and without contrast on 11/17/2020 which showed possible left paranasal sinusitis but nothing concerning.  Memory problems: She has had problems with short term memory since around 2021-2022, which has been getting worse over the past year.  She needs to keep sticky notes as reminders. Needs to have all appointments in her phone.  She does have increased emotional stress related.  Her daughter  is going through a divorce.  Several years ago, she had an elevated TSH but further workup was negative.  She is concerned because her sister had Alzheimer's disease.    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 pylori infection    History of blood transfusion 1984   Hx of colonic polyps    Hypercholesteremia    Hyponatremia    Migraines    Vitamin D deficiency     MEDICATIONS: Current Outpatient Medications on File Prior to Visit  Medication Sig Dispense Refill   botulinum toxin Type A  (BOTOX ) 200 units injection INJECT 155 UNITS INTRAMUSCULARLY INTO MULTIPLE SITES OF FACE, NECK AND HEAD EVERY 90 DAYS 1 each 4   botulinum toxin Type A  (BOTOX ) 200 units injection Inject 155 units IM into multiple site in the face,neck and head once every 90 days 1 each 4   Calcium 600-200 MG-UNIT tablet Take 1 tablet by mouth daily.     docusate sodium (COLACE) 100 MG capsule Take 100 mg by mouth at bedtime.      polyethylene glycol (MIRALAX / GLYCOLAX) packet Take 17 g by mouth at bedtime.     promethazine  (PHENERGAN ) 25 MG tablet Take 1 to 2 tablets every 6 hours as needed for nausea. 30 tablet 5   Simethicone  (PHAZYME MAXIMUM STRENGTH) 250 MG CAPS Take 250 mg by mouth daily as needed (gas).     SUMAtriptan  (IMITREX ) 50 MG tablet Take 1 tablet (50 mg total) by mouth as needed for migraine. May repeat in 2 hours if headache persists or recurs.  Maximum 2 tablets in 24 hours. 10 tablet 5   traMADol (ULTRAM) 50 MG tablet Take 1 tablet by mouth 4 (four) times daily as needed.     XIIDRA  5 % SOLN Place 1 drop into both eyes 2 (two) times daily.     zonisamide  (ZONEGRAN ) 100 MG capsule TAKE TWO CAPSULES BY MOUTH DAILY 60 capsule 5   No current facility-administered medications on file prior to visit.    ALLERGIES: Allergies  Allergen  Reactions   Aspirin Other (See Comments)    Hx of bleeding stomach ulcer    Crestor [Rosuvastatin Calcium] Itching   Inderal [Propranolol] Other (See Comments)    Chest tightness   Macrobid [Nitrofurantoin Monohyd Macro] Diarrhea   Other     Other reaction(s): Other (See Comments) Cholesterol meds-multiple reactions to multiple meds   Relpax [Eletriptan Hydrobromide] Other (See Comments)    Chest Tightness    Salicylates Other (See Comments)    Told not to take due to hx of GI ulcer   Topamax [Topiramate] Other (See Comments)    Chest tightness    FAMILY HISTORY: Family History  Problem Relation Age of Onset   Migraines Mother    Heart attack Mother    Migraines Sister    Cancer Father        Prostate      Objective:  Blood pressure (!) 144/80, pulse 80, height 5' (1.524 m), weight 136 lb (61.7 kg), last menstrual period 08/07/2001, SpO2 99%. General: No acute distress.  Patient appears well-groomed.      Juliene Dunnings, DO  CC: Reena Duck, MD

## 2024-04-28 ENCOUNTER — Ambulatory Visit: Admitting: Neurology

## 2024-04-28 ENCOUNTER — Encounter: Payer: Self-pay | Admitting: Neurology

## 2024-04-28 VITALS — BP 144/80 | HR 80 | Ht 60.0 in | Wt 136.0 lb

## 2024-04-28 DIAGNOSIS — G43109 Migraine with aura, not intractable, without status migrainosus: Secondary | ICD-10-CM

## 2024-05-22 ENCOUNTER — Other Ambulatory Visit (HOSPITAL_COMMUNITY): Payer: Self-pay

## 2024-05-26 ENCOUNTER — Other Ambulatory Visit: Payer: Self-pay

## 2024-05-26 ENCOUNTER — Other Ambulatory Visit (HOSPITAL_COMMUNITY): Payer: Self-pay

## 2024-05-28 ENCOUNTER — Telehealth: Payer: Self-pay

## 2024-05-28 ENCOUNTER — Other Ambulatory Visit: Payer: Self-pay

## 2024-05-28 ENCOUNTER — Other Ambulatory Visit (HOSPITAL_COMMUNITY): Payer: Self-pay

## 2024-05-28 NOTE — Telephone Encounter (Signed)
 Tried calling patient, Please call pharmacy in regards to her Botox .   No answer at home phone number, LMOVM on Primary number.

## 2024-05-28 NOTE — Progress Notes (Signed)
 Specialty Pharmacy Refill Coordination Note  [name] is a 73 y.o. female assessed today regarding refills of clinic administered specialty medication(s) OnabotulinumtoxinA  (Botox )   Clinic requested Courier to Provider Office   Delivery date: 05/29/24   Verified address: [facility] [address] [place]   Medication will be filled on 05/28/24.

## 2024-05-30 ENCOUNTER — Ambulatory Visit: Admitting: Neurology

## 2024-05-30 DIAGNOSIS — G43709 Chronic migraine without aura, not intractable, without status migrainosus: Secondary | ICD-10-CM

## 2024-05-30 MED ORDER — ONABOTULINUMTOXINA 100 UNITS IJ SOLR
200.0000 [IU] | Freq: Once | INTRAMUSCULAR | Status: AC
  Administered 2024-05-30: 155 [IU] via INTRAMUSCULAR

## 2024-05-30 NOTE — Progress Notes (Signed)

## 2024-06-05 ENCOUNTER — Telehealth: Payer: Self-pay

## 2024-06-05 NOTE — Telephone Encounter (Signed)
 Prior approval letter received from [facility]. Botox  approved 08/07/24-08/06/25 Mzq#[id number]

## 2024-06-09 ENCOUNTER — Telehealth: Payer: Self-pay | Admitting: Pharmacy Technician

## 2024-06-09 NOTE — Telephone Encounter (Signed)
 Pharmacy Patient Advocate Encounter  Received notification from [employer] that Prior Authorization for BOTOX  has been APPROVED from 1.1.26 to 12.31.26   PA #/Case ID/Reference #: [id number]

## 2024-06-13 ENCOUNTER — Other Ambulatory Visit: Payer: Self-pay | Admitting: Neurology

## 2024-06-23 ENCOUNTER — Other Ambulatory Visit: Payer: Self-pay | Admitting: Neurology

## 2024-08-19 ENCOUNTER — Other Ambulatory Visit: Payer: Self-pay

## 2024-08-20 ENCOUNTER — Other Ambulatory Visit: Payer: Self-pay

## 2024-08-21 ENCOUNTER — Other Ambulatory Visit: Payer: Self-pay

## 2024-08-21 ENCOUNTER — Other Ambulatory Visit: Payer: Self-pay | Admitting: Neurology

## 2024-08-21 DIAGNOSIS — G43709 Chronic migraine without aura, not intractable, without status migrainosus: Secondary | ICD-10-CM

## 2024-08-21 MED ORDER — BOTOX 200 UNITS IJ SOLR
INTRAMUSCULAR | 4 refills | Status: AC
  Filled 2024-08-21: qty 1, fill #0

## 2024-08-22 ENCOUNTER — Telehealth: Payer: Self-pay | Admitting: Pharmacy Technician

## 2024-08-22 ENCOUNTER — Other Ambulatory Visit (HOSPITAL_COMMUNITY): Payer: Self-pay

## 2024-08-22 NOTE — Telephone Encounter (Signed)
 Pharmacy Patient Advocate Encounter  Received notification from [employer] that Prior Authorization for BOTOX  200 has been DENIED.  See denial reason below. No denial letter attached in CMM. Will attach denial letter to Media tab once received.    PA #/Case ID/Reference #: [id number]

## 2024-08-22 NOTE — Progress Notes (Signed)
 PA has been submitted, and telephone encounter has been created. Please see telephone encounter dated 1.16.26.

## 2024-08-22 NOTE — Telephone Encounter (Signed)
 Pharmacy Patient Advocate Encounter   Received notification from Pt Calls Messages that prior authorization for BOTOX   is required/requested.   Insurance verification completed.   The patient is insured through [employer].   Per test claim: PA required; PA submitted to above mentioned insurance via Latent Key/confirmation #/EOC [id number] Status is pending

## 2024-08-24 ENCOUNTER — Other Ambulatory Visit (HOSPITAL_COMMUNITY): Payer: Self-pay

## 2024-08-29 ENCOUNTER — Encounter: Payer: Self-pay | Admitting: Neurology

## 2024-08-29 ENCOUNTER — Ambulatory Visit (INDEPENDENT_AMBULATORY_CARE_PROVIDER_SITE_OTHER): Admitting: Neurology

## 2024-08-29 ENCOUNTER — Ambulatory Visit: Admitting: Neurology

## 2024-08-29 VITALS — BP 118/57 | HR 97 | Ht 66.0 in | Wt 130.0 lb

## 2024-08-29 DIAGNOSIS — G43109 Migraine with aura, not intractable, without status migrainosus: Secondary | ICD-10-CM

## 2024-08-29 DIAGNOSIS — G43709 Chronic migraine without aura, not intractable, without status migrainosus: Secondary | ICD-10-CM

## 2024-08-29 MED ORDER — QULIPTA 60 MG PO TABS: ORAL_TABLET | ORAL | 0 refills | Status: AC

## 2024-08-29 MED ORDER — QULIPTA 60 MG PO TABS: 60.0000 mg | ORAL_TABLET | Freq: Every day | ORAL | 5 refills | Status: AC

## 2024-08-29 NOTE — Progress Notes (Signed)
 "  NEUROLOGY FOLLOW UP OFFICE NOTE  [name] [id number]  Assessment/Plan:   Migraine without aura, without status migrainosus, not intractable   Migraine prevention:  Change from Botox  to Qulipta 60mg  daily, continue zonisamide  200mg  daily.   Migraine rescue:  sumatriptan  50mg  tab.  Prefers tablet over injection.   Limit use of pain relievers to no more than 2 days out of week to prevent risk of rebound or medication-overuse headache. Keep headache diary Recommended ordering neuropsychological evaluation.  Declines now.  Will contact us  if she changes her mind. Follow up in 6 months.  Subjective:  [name] is a 74 year old right-handed Caucasian woman with depression, anxiety and history of benzodiazepine and barbiturates dependence and gastric ulcer who follows up for migraines and concern about her memory.[name]   UPDATE: Doing well.  Headaches but only one migraine 1 to 2 times a month.  Aborts in 1-2 hours with sumatriptan .    However, her insurance is requiring her to try Qulipta before considering covering Botox   Current NSAIDS:  contraindicated Current analgesics:  Tramadol; Tylenol  Current triptans: sumatriptan  50mg  Current ergotamine:  no Current anti-emetic:  Promethazine  25mg -50mg  Current muscle relaxants:  no Current Antihypertensive medications:  no Current Antidepressant medications:  no Current Anticonvulsant medications:  zonisamide  200mg  daily Current anti-CGRP:  none Current Vitamins/Herbal/Supplements:  D Current Antihistamines/Decongestants:  no Other therapy:   Botox  (since 01/03/2019)    Caffeine:  1 cup 1/2 decaf coffee daily Alcohol :  no Smoker:  no Diet:  hydrates Exercise:  Stopped due to torn knee meniscus Depression:  no; Anxiety:  yes. Other pain:  back pain Sleep hygiene:  poor   HISTORY: Migraines:  Onset: 2016.  She has remote history of migraines which were controlled for many years.  In December 2015, her husband  unexpectedly passed away, which triggered depression and anxiety.  She subsequently recovered.  However, in early June 2016, she witnessed her boyfriend's ill father pass away.  It triggered flashbacks of her husband's death, which increased depression, anxiety and migraines.  She has significant history of side effects to multiple medications. Location:  Left peri-orbital Quality:  pounding Initial Intensity:  10/10 Aura:  no Prodrome:  no Associated symptoms: Nausea, photophobia, phonophobia, osmophobia Initial Duration:  All day Initial Frequency:  15 days per month Triggers: Emotional stress Relieving factors: Tramadol Activity:  aggravates   Past NSAIDS:  Oral NSAIDs contraindicated due to bleeding ulcer Past analgesics:  Cannot take ASA products such as Excedrin due to bleeding ulcer Past abortive triptans:  Eletriptan (took it multiple times but caused chest tightness once, but was effective), rizatriptan  10mg  Past anti-emetic:  Zofran  8mg  (effective) Past anti-anxiolytic:  benzodiazepines Past antihypertensive medications:  propranolol (chest tightness) Past antidepressant medications:  Multiple, such as Cymbalta.  She has had intolerance to multiple antidepressants over the years. Past anticonvulsant medications:  topiramate (chest tightness); Depakote  ER 250mg  daily (higher doses caused increased hunger) Past anti-CGRP:  Aimovig  (constipation, nausea), Ubrelvy  Past vitamins/Herbal/Supplements:  no   Family history of headache:  Mom, sister   MRI and MRA of head from 11/05/10 were personally reviewed and were unremarkable. Due to persistent headache, she had another MRI of brain with and without contrast on 11/17/2020 which showed possible left paranasal sinusitis but nothing concerning.  Memory problems: She has had problems with short term memory since around 2021-2022, which has been getting worse over the past year.  She needs to keep sticky notes as reminders. Needs to have  all appointments in  her phone.  She does have increased emotional stress related.  Her daughter is going through a divorce.  Several years ago, she had an elevated TSH but further workup was negative.  She is concerned because her sister had Alzheimer's disease.    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 pylori infection    History of blood transfusion 1984   Hx of colonic polyps    Hypercholesteremia    Hyponatremia    Migraines    Vitamin D deficiency     MEDICATIONS: Current Outpatient Medications on File Prior to Visit  Medication Sig Dispense Refill   botulinum toxin Type A  (BOTOX ) 200 units injection INJECT 155 UNITS INTRAMUSCULARLY INTO MULTIPLE SITES OF FACE, NECK AND HEAD EVERY 90 DAYS (Patient not taking: Reported on 08/29/2024) 1 each 4   botulinum toxin Type A  (BOTOX ) 200 units injection Inject 155 units IM into multiple site in the face,neck and head once every 90 days (Patient not taking: Reported on 08/29/2024) 1 each 4   Calcium 600-200 MG-UNIT tablet Take 1 tablet by mouth daily. (Patient not taking: Reported on 08/29/2024)     docusate sodium (COLACE) 100 MG capsule Take 100 mg by mouth at bedtime.      polyethylene glycol (MIRALAX / GLYCOLAX) packet Take 17 g by mouth at bedtime.     promethazine  (PHENERGAN ) 25 MG tablet Take 1 to 2 tablets every 6 hours as needed for nausea. 30 tablet 5   Simethicone  (PHAZYME MAXIMUM STRENGTH) 250 MG CAPS Take 250 mg by mouth daily as needed (gas).     SUMAtriptan  (IMITREX ) 50 MG tablet Take 1 tablet (50 mg total) by mouth as needed for migraine. May repeat in 2 hours if headache persists or recurs.  Maximum 2 tablets in 24 hours. 10 tablet 5   traMADol (ULTRAM) 50 MG tablet Take 1 tablet by mouth 4 (four) times daily as needed.     XIIDRA  5 % SOLN Place 1 drop into both eyes  2 (two) times daily.     zonisamide  (ZONEGRAN ) 100 MG capsule TAKE TWO CAPSULES BY MOUTH DAILY 60 capsule 1   No current facility-administered medications on file prior to visit.    ALLERGIES: Allergies  Allergen Reactions   Aspirin Other (See Comments)    Hx of bleeding stomach ulcer    Crestor [Rosuvastatin Calcium] Itching   Inderal [Propranolol] Other (See Comments)    Chest tightness   Macrobid [Nitrofurantoin Monohyd Macro] Diarrhea   Other     Other reaction(s): Other (See Comments) Cholesterol meds-multiple reactions to multiple meds   Relpax [Eletriptan Hydrobromide] Other (See Comments)    Chest Tightness    Salicylates Other (See Comments)    Told not to take due to hx of GI ulcer   Topamax [Topiramate] Other (See Comments)    Chest tightness    FAMILY HISTORY: Family History  Problem Relation Age of Onset   Migraines Mother    Heart attack Mother    Migraines Sister    Cancer Father        Prostate      Objective:  Blood pressure (!) 118/57, pulse 97, height 5' 6 (1.676 m), weight 130 lb (59 kg), last menstrual period 08/07/2001, SpO2 98%. General: No acute distress.  Patient appears  well-groomed.      Juliene Dunnings, DO  CC: Reena Duck, MD       "

## 2024-08-29 NOTE — Patient Instructions (Addendum)
 Start QULIPTA 60MG  DAILY.  If no improvement in 2 months, contact me and we can return to Botox  Continue zonisamide  200mg  daily Sumatriptan  as needed.  Limit use of pain relievers to no more than 9 days out of the month to prevent risk of rebound or medication-overuse headache. Follow up 6 months.

## 2024-08-29 NOTE — Progress Notes (Signed)
 Cancelled Botox .

## 2024-09-05 ENCOUNTER — Telehealth: Payer: Self-pay | Admitting: Neurology

## 2024-09-05 NOTE — Telephone Encounter (Signed)
 error

## 2025-03-10 ENCOUNTER — Ambulatory Visit: Payer: Self-pay | Admitting: Neurology

## 2025-03-13 ENCOUNTER — Ambulatory Visit: Admitting: Neurology

## 2025-04-28 ENCOUNTER — Ambulatory Visit: Admitting: Neurology
